# Patient Record
Sex: Male | Born: 1950 | Race: White | Hispanic: No | Smoking: Never smoker
Health system: Southern US, Community
[De-identification: ages and names within clinical notes are randomized; demographics above are authoritative.]

## PROBLEM LIST (undated history)

## (undated) DIAGNOSIS — F329 Major depressive disorder, single episode, unspecified: Secondary | ICD-10-CM

## (undated) DIAGNOSIS — F32A Depression, unspecified: Secondary | ICD-10-CM

## (undated) DIAGNOSIS — Z96652 Presence of left artificial knee joint: Secondary | ICD-10-CM

## (undated) DIAGNOSIS — K759 Inflammatory liver disease, unspecified: Secondary | ICD-10-CM

## (undated) DIAGNOSIS — M1711 Unilateral primary osteoarthritis, right knee: Secondary | ICD-10-CM

## (undated) DIAGNOSIS — F419 Anxiety disorder, unspecified: Secondary | ICD-10-CM

## (undated) HISTORY — PX: OTHER SURGICAL HISTORY: SHX169

---
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
## 1998-05-12 ENCOUNTER — Ambulatory Visit (HOSPITAL_COMMUNITY): Admission: RE | Admit: 1998-05-12 | Discharge: 1998-05-12 | Payer: Self-pay | Admitting: Sports Medicine

## 1998-07-07 ENCOUNTER — Ambulatory Visit (HOSPITAL_BASED_OUTPATIENT_CLINIC_OR_DEPARTMENT_OTHER): Admission: RE | Admit: 1998-07-07 | Discharge: 1998-07-07 | Payer: Self-pay | Admitting: Orthopedic Surgery

## 2000-05-01 ENCOUNTER — Emergency Department (HOSPITAL_COMMUNITY): Admission: EM | Admit: 2000-05-01 | Discharge: 2000-05-01 | Payer: Self-pay | Admitting: Emergency Medicine

## 2000-05-01 ENCOUNTER — Encounter: Payer: Self-pay | Admitting: Emergency Medicine

## 2000-05-02 ENCOUNTER — Ambulatory Visit (HOSPITAL_BASED_OUTPATIENT_CLINIC_OR_DEPARTMENT_OTHER): Admission: RE | Admit: 2000-05-02 | Discharge: 2000-05-02 | Payer: Self-pay | Admitting: Orthopedic Surgery

## 2002-07-18 ENCOUNTER — Encounter: Payer: Self-pay | Admitting: Orthopedic Surgery

## 2002-07-18 ENCOUNTER — Ambulatory Visit (HOSPITAL_COMMUNITY): Admission: RE | Admit: 2002-07-18 | Discharge: 2002-07-18 | Payer: Self-pay | Admitting: Orthopedic Surgery

## 2002-08-05 ENCOUNTER — Ambulatory Visit (HOSPITAL_BASED_OUTPATIENT_CLINIC_OR_DEPARTMENT_OTHER): Admission: RE | Admit: 2002-08-05 | Discharge: 2002-08-05 | Payer: Self-pay | Admitting: Orthopedic Surgery

## 2003-08-28 ENCOUNTER — Encounter: Payer: Self-pay | Admitting: Emergency Medicine

## 2003-08-28 ENCOUNTER — Emergency Department (HOSPITAL_COMMUNITY): Admission: EM | Admit: 2003-08-28 | Discharge: 2003-08-28 | Payer: Self-pay

## 2004-11-18 ENCOUNTER — Emergency Department (HOSPITAL_COMMUNITY): Admission: EM | Admit: 2004-11-18 | Discharge: 2004-11-18 | Payer: Self-pay | Admitting: Family Medicine

## 2006-04-21 ENCOUNTER — Encounter: Admission: RE | Admit: 2006-04-21 | Discharge: 2006-04-21 | Payer: Self-pay | Admitting: Internal Medicine

## 2008-12-18 ENCOUNTER — Ambulatory Visit: Payer: Self-pay | Admitting: Internal Medicine

## 2009-05-12 ENCOUNTER — Ambulatory Visit: Payer: Self-pay | Admitting: Internal Medicine

## 2009-11-27 ENCOUNTER — Ambulatory Visit: Payer: Self-pay | Admitting: Internal Medicine

## 2010-07-22 ENCOUNTER — Ambulatory Visit: Payer: Self-pay | Admitting: Internal Medicine

## 2010-12-05 ENCOUNTER — Emergency Department (HOSPITAL_COMMUNITY)
Admission: EM | Admit: 2010-12-05 | Discharge: 2010-12-05 | Payer: Self-pay | Source: Home / Self Care | Admitting: Family Medicine

## 2011-04-08 NOTE — Op Note (Signed)
[facility]  Patient:    Paul [name], Paul [name]                          MRN: [id number] Proc. Date: 05/02/00 Adm. Date:  [id number] Disc. Date: [id number] Attending:  Marlowe Shores CC:         Artist Pais Mina Marble, M.D. (two copies)                           Operative Report  PREOPERATIVE DIAGNOSIS:  Retained wooden fragments at the palmar aspect, right hand.  POSTOPERATIVE DIAGNOSIS:  Retained wooden fragments at the palmar aspect, right hand.  OPERATIONS: 1. Irrigation and debridement. 2. Removal of foreign body, deep right hand.  SURGEON:  Artist Pais. Mina Marble, M.D.  ASSISTANT:  Junius Roads. Ireton, P.A.C.  ANESTHESIA:  Bier block.  TOURNIQUET TIME:  28 minutes.  COMPLICATIONS:  None.  DRAINS:  None.  DESCRIPTION OF PROCEDURE:  The patient was taken to the operating room.  After induction of adequate Bier block analgesia, the right upper extremity was prepped and draped in the usual sterile fashion.  Once this was done, a Lorrene Reid type incision was made over the palmar aspect of the right hand in line with the metacarpal ray of the index finger where previous injury was identified.  The incision was raised appropriately with flaps elevated radially and medially.  Once this was done, dissection down to the area between the interosseous muscles of the index and long finger metacarpals revealed a wooden splinter approximately 1.5 cm in length x 3 mm in diameter. There were also multiple small fragments of bone that were removed as well. Thorough exploration of the wound including dissection around neurovascular bundles and the flexor tendon sheath revealed no significant infection in the sheath.  There was some cloudy fluid upon initial entrance of the wound.  This was cultured for both aerobic and anaerobic specimens.  The wound was thoroughly irrigated and then closed loosely with 4-0 nylon.  Attention was then paid to the dorsum of the hand  between the index and long finger metacarpals where the web space was incised for 2 cm.  Blunt dissection was carried down between the metacarpophalangeal joint and index of long finger. There was no evidence of pus, foreign body, etc.  This was thoroughly irrigated as well and was closed with 4-0 nylon.  Sterile dressing and Xeroform, 4 x 4s and a compressive dressing was applied.  The patient tolerated the procedure well and went to recovery room in stable fashion. DD:  05/02/00 TD:  05/05/00 Job: [id number]

## 2011-04-08 NOTE — Op Note (Signed)
NAME:  Paul [name], Paul [name]                             ACCOUNT NO.:  [id number]   MEDICAL RECORD NO.:  [id number]                   PATIENT TYPE:  AMB   LOCATION:  DSC                                  FACILITY:  [facility]   PHYSICIAN:  Robert A. Thurston Hole, M.D.              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   POSTOPERATIVE DIAGNOSIS:  Right knee medial and lateral meniscal tears with  chondromalacia.   PROCEDURE:  1. Right knee EOA followed by arthroscopic partial medial and lateral     meniscectomies.  2. Right knee tricompartmental chondroplasty.   SURGEON:  Elana Alm. Thurston Hole, M.D.   ASSISTANT:  Julien Girt, P.A.   ANESTHESIA:  General.   OPERATIVE TIME:  Thirty minutes.   COMPLICATIONS:  None.   INDICATIONS FOR PROCEDURE:  The patient is a 60 year old gentleman who  injured his right knee with a twisting injury 6 weeks ago with persistent  pain and swelling with x-ray, exam and MRI documenting a medial meniscus  tear who has failed conservative care and is now to undergo arthroscopy.   DESCRIPTION:  The patient was brought to the operating room on August 05, 2002 and placed on the operative table in supine position.  After being  placed under general anesthesia, his right knee was examined under  anesthesia.  Range of motion 0-120 degrees, 1 and 2+ crepitation, he was  stable to ligamentous exam to ligamentous exam with normal patella tracking.  He received Ancef 1 g IV preoperatively for prophylaxis.  His right knee was  examined under anesthesia.  He had full range of motion even with stable  ligamentous exam.  After being prepped with DuraPrep and draped under  sterile conditions, the arthroscopy was performed.  Through an inferolateral  portal the arthroscope with a pump attached was placed and through  an  inferomedial portal an arthroscopic probe was placed.  On initial inspection  of medial compartment the articular cartilage medial femoral condyle showed  50% grade 3 chondromalacia which was debrided, 25%  grade 3 changes medial  tibial plateau which was debrided.  Medial meniscus showed tearing of the  posterior and medial horn of which 40-50% was resected back to a stable rim.  Anterior condylar notch, anterior and posterior cruciate ligaments were  normal, lateral compartment inspected, articular cartilage lateral femoral  condyle of tibial plateau showed 25% grade 3 chondromalacia, the rest grade  1 and 2 changes and this was debrided.  Lateral meniscus showed a small tear  posterolateral point of 20% which was resected back to a stable rim.  Patellofemoral joint inspected, grade 3 chondromalacia of 30-40% of the  patella and the femoral groove which was debrided.  Patella tracked  normally.  Moderate synovitis in the medial and lateral gutters were  debrided otherwise, they were free of pathology.  After this was done it was  felt that all pathology had been satisfactory just.  The instruments were  removed.  The portal was closed with 3-0 nylon suture and injected with  0.25% Marcaine with epinephrine in 4 mg of morphine.  Sterile dressings  applied and the patient awakened and taken to recovery room in stable  condition.   FOLLOWUP CARE:  The patient will be followed as an outpatient on Vicodin and  Naprosyn.  See me in my office in 1 week procedure out in followup.                                               Robert A. Thurston Hole, M.D.    RAW/MEDQ  D:  08/05/2002  T:  08/05/2002  Job:  [id number]

## 2011-08-29 ENCOUNTER — Other Ambulatory Visit: Payer: Self-pay | Admitting: Internal Medicine

## 2011-08-29 NOTE — Telephone Encounter (Signed)
Needs office visit. Refill x 30 days.

## 2012-02-24 ENCOUNTER — Other Ambulatory Visit: Payer: Self-pay

## 2012-02-24 ENCOUNTER — Other Ambulatory Visit: Payer: Self-pay | Admitting: Internal Medicine

## 2012-02-24 MED ORDER — DULOXETINE HCL 60 MG PO CPEP: 60.0000 mg | ORAL_CAPSULE | Freq: Every day | ORAL | Status: DC

## 2012-02-24 NOTE — Telephone Encounter (Signed)
Patient has an appointment here next week.

## 2012-02-24 NOTE — Telephone Encounter (Signed)
Please see when last OV was.

## 2012-03-01 ENCOUNTER — Ambulatory Visit: Payer: Self-pay | Admitting: Internal Medicine

## 2012-03-08 ENCOUNTER — Encounter: Payer: Self-pay | Admitting: Internal Medicine

## 2012-03-08 ENCOUNTER — Ambulatory Visit (INDEPENDENT_AMBULATORY_CARE_PROVIDER_SITE_OTHER): Payer: Self-pay | Admitting: Internal Medicine

## 2012-03-08 VITALS — BP 138/74 | HR 86 | Temp 98.2°F | Resp 16 | Ht 71.0 in | Wt 172.0 lb

## 2012-03-08 DIAGNOSIS — F329 Major depressive disorder, single episode, unspecified: Secondary | ICD-10-CM

## 2012-03-19 DIAGNOSIS — F329 Major depressive disorder, single episode, unspecified: Secondary | ICD-10-CM | POA: Insufficient documentation

## 2012-03-19 NOTE — Patient Instructions (Signed)
Decreased Cymbalta to 30 mg daily. Return for physical examination in 6 months.

## 2012-03-19 NOTE — Progress Notes (Signed)
Subjective:    Patient ID: Paul [name], male    DOB: [birth year], 61 y.o.   MRN: [id number]  HPI 61 year old white male currently separated from his wife in today for followup on depression. He's been on Cymbalta 60 mg daily for some time now. Says he would like to decrease it at this point. Says he's not optimistic that he and wife will get back together. He has 2 children. His oldest child, a daughter, is in high school. He has a son that is several years younger than his daughter. 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 Likes his job. Has elderly father that depends on him a great deal. Patient was adopted.    Review of Systems     Objective:   Physical Exam chest clear to auscultation. Cardiac exam regular rate and rhythm.        Assessment & Plan:  Depression  Plan: Patient will decrease Cymbalta to 30 mg daily. New prescription written. Patient reminded he needs to physical exam in the near future. Return in 6 months.

## 2012-05-17 ENCOUNTER — Other Ambulatory Visit: Payer: Self-pay

## 2012-05-17 MED ORDER — DULOXETINE HCL 30 MG PO CPEP: 30.0000 mg | ORAL_CAPSULE | Freq: Every day | ORAL | Status: DC

## 2012-11-28 ENCOUNTER — Other Ambulatory Visit: Payer: Self-pay | Admitting: Internal Medicine

## 2012-11-28 NOTE — Telephone Encounter (Signed)
Needs office visit.

## 2012-12-24 ENCOUNTER — Ambulatory Visit (INDEPENDENT_AMBULATORY_CARE_PROVIDER_SITE_OTHER): Payer: Managed Care, Other (non HMO) | Admitting: Internal Medicine

## 2012-12-24 ENCOUNTER — Encounter: Payer: Self-pay | Admitting: Internal Medicine

## 2012-12-24 DIAGNOSIS — Z8619 Personal history of other infectious and parasitic diseases: Secondary | ICD-10-CM

## 2012-12-24 DIAGNOSIS — F329 Major depressive disorder, single episode, unspecified: Secondary | ICD-10-CM

## 2012-12-24 NOTE — Progress Notes (Signed)
Subjective:    Patient ID: Paul [name], male    DOB: 05/09/1951, 62 y.o.   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 He has a history of hepatitis C and received interferon. He recently had lab work done through employment including liver functions. Gastroenterologist in [place] told him as long as liver functions were normal he should not have to have a viral load followed on a regular basis. Have asked him to forward recent lab work to me. He's not had a physical exam here in some time. He has had a previous colonoscopy by Dr. Matthias Hughs but that was around 5 years ago and he should call and see when repeat study is due.  He was last seen here April 2013. At that time Cymbalta was decreased from 60 mg to 30 mg. He's done well on 30 mg daily. He still remains separated from his wife. Shares custody of 2 children who are teenagers. Probably best if he stays on this medication for an additional year although he did ask about coming off the medication. It is now generic and less expensive for him. He has an elderly father who is 44 years old. He is responsible for him. He has dementia.  Blood pressure is elevated today at 140 systolically. Says he's been watching it and it is usually about 130 systolically. He does exercise a great deal  And is in great physical condition.    Review of Systems     Objective:   Physical Exam chest is clear to auscultation. Cardiac exam regular rate and rhythm. Extremities without edema.        Assessment & Plan:  Elevated blood pressure  History of hepatitis C status post treatment with interferon  Depression-situational surrounding issues with separation  Plan: Refill Cymbalta generic 30 mg daily for an additional year. Patient should call Dr. Matthias Hughs regarding colonoscopy due date. Patient was asked to forward to me results of  recent lab work done through employment. Patient is continue to monitor blood pressure and advise if remains elevated.

## 2012-12-24 NOTE — Patient Instructions (Addendum)
Continue Cymbalta 30 mg daily. Return in one year. Call gastroenterologist to see when colonoscopy is due. Please follow her today results of recent lab work done through employment.

## 2013-04-09 ENCOUNTER — Ambulatory Visit: Payer: Self-pay | Admitting: Internal Medicine

## 2013-04-18 ENCOUNTER — Ambulatory Visit (INDEPENDENT_AMBULATORY_CARE_PROVIDER_SITE_OTHER): Payer: Managed Care, Other (non HMO) | Admitting: Internal Medicine

## 2013-04-18 ENCOUNTER — Encounter: Payer: Self-pay | Admitting: Internal Medicine

## 2013-04-18 VITALS — BP 138/76 | HR 80 | Temp 97.9°F | Ht 70.0 in | Wt 165.0 lb

## 2013-04-18 DIAGNOSIS — K140 Glossitis: Secondary | ICD-10-CM

## 2013-04-18 LAB — CBC WITH DIFFERENTIAL/PLATELET
Basophils Absolute: 0 10*3/uL (ref 0.0–0.1)
Basophils Relative: 0 % (ref 0–1)
Eosinophils Relative: 0 % (ref 0–5)
Lymphocytes Relative: 3 % — ABNORMAL LOW (ref 12–46)
MCHC: 34.9 g/dL (ref 30.0–36.0)
Neutro Abs: 13 10*3/uL — ABNORMAL HIGH (ref 1.7–7.7)
Platelets: 259 10*3/uL (ref 150–400)
RDW: 14.2 % (ref 11.5–15.5)
WBC: 14.3 10*3/uL — ABNORMAL HIGH (ref 4.0–10.5)

## 2013-04-18 NOTE — Patient Instructions (Addendum)
Use Magic mouthwash as directed 4 times daily. Call if not better in 2 weeks. Labs are pending.

## 2013-04-18 NOTE — Progress Notes (Signed)
Subjective:    Patient ID: Paul [name], male    DOB: 1951/02/07, 62 y.o.   MRN: [id number]  HPI Patient has been working out and exercising a great deal. Says he is in the best physical shape he's been in in over 4 years. For the past 3 weeks he's been experiencing turning sensation and decreased taste of his tongue. Says it feels like he has burned his tongue but he doesn't recall any injury to his tongue. Says both sides are irritated as well as the tip of his tongue. Says he's eating well. Does take a multivitamin daily. Not drinking alcohol to excess. He is adopted so we know little about his family history. Nonsmoker. No recent antibiotic therapy.    Review of Systems     Objective:   Physical Exam  tongue has a mild white coating midportion. Tip of tongue is slightly atrophic. No significant for [employer]. Sides of tongue slightly atrophic as well. No hairy coating on tongue.        Assessment & Plan:  Glossitis  Plan: Check for B12 and folate deficiencies. Also drew CBC with differential and C. met. Prescribed Magic mouthwash 16 ounces 2 teaspoons by mouth swish do not swallow 4 times a day with one refill. If not better in 2 weeks, consider referral to oral surgeon.

## 2013-04-19 LAB — COMPREHENSIVE METABOLIC PANEL
ALT: 22 U/L (ref 0–53)
AST: 22 U/L (ref 0–37)
Alkaline Phosphatase: 51 U/L (ref 39–117)
BUN: 24 mg/dL — ABNORMAL HIGH (ref 6–23)
Calcium: 9.7 mg/dL (ref 8.4–10.5)
Chloride: 99 mEq/L (ref 96–112)
Creat: 1.1 mg/dL (ref 0.50–1.35)
Potassium: 3.7 mEq/L (ref 3.5–5.3)

## 2013-05-03 ENCOUNTER — Telehealth: Payer: Self-pay | Admitting: Internal Medicine

## 2013-05-03 NOTE — Telephone Encounter (Signed)
Please call in Valtrex 500 mg twice daily for 5 days with one refill

## 2013-05-07 ENCOUNTER — Encounter: Payer: Self-pay | Admitting: Internal Medicine

## 2013-05-07 ENCOUNTER — Ambulatory Visit (INDEPENDENT_AMBULATORY_CARE_PROVIDER_SITE_OTHER): Payer: Managed Care, Other (non HMO) | Admitting: Internal Medicine

## 2013-05-07 VITALS — BP 136/78 | Temp 97.9°F | Wt 161.0 lb

## 2013-05-07 DIAGNOSIS — K140 Glossitis: Secondary | ICD-10-CM

## 2013-05-07 DIAGNOSIS — B009 Herpesviral infection, unspecified: Secondary | ICD-10-CM

## 2013-05-07 DIAGNOSIS — F439 Reaction to severe stress, unspecified: Secondary | ICD-10-CM

## 2013-05-07 DIAGNOSIS — J069 Acute upper respiratory infection, unspecified: Secondary | ICD-10-CM

## 2013-05-07 DIAGNOSIS — Z733 Stress, not elsewhere classified: Secondary | ICD-10-CM

## 2013-05-07 NOTE — Patient Instructions (Addendum)
Take Valtrex 500 mg twice daily for 5 days. Take Levaquin 500 milligrams daily for 7 days.

## 2013-05-07 NOTE — Progress Notes (Signed)
Subjective:    Patient ID: Paul [name], male    DOB: 12/01/1950, 62 y.o.   MRN: [id number]  HPI  He was here recently complaining of discomfort. We did some lab work. Thought perhaps he had a viral syndrome. 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 He called about this on Friday, June 13. I have given instructions for him to take Valtrex 500 mg twice daily for 5 days but apparently the prescription did not get called in. He is here today complaining of URI symptoms. He's very fatigued. He hasn't been working out. He's coughing up discolored sputum. No fever or shaking chills. Recently his wife surgeon was divorce papers which was upsetting. He had hoped that they would be able to get back together. They have been separated for 4 years. Does not have herpes simplex type II    Review of Systems     Objective:   Physical Exam tongue is coated. He says it feels irritated. TMs are clear. Pharynx is clear. Neck is supple without adenopathy. Chest clear to auscultation. No herpes simplex lesions type I identified today.        Assessment & Plan:  Glossitis  Herpes simplex type I  Upper respiratory infection acute  Plan: Levaquin 500 milligrams daily for 7 days. Valtrex 500 mg twice daily for 5 days with when necessary one year refills.

## 2013-06-10 ENCOUNTER — Encounter: Payer: Self-pay | Admitting: Internal Medicine

## 2013-06-10 ENCOUNTER — Ambulatory Visit (INDEPENDENT_AMBULATORY_CARE_PROVIDER_SITE_OTHER): Payer: Managed Care, Other (non HMO) | Admitting: Internal Medicine

## 2013-06-10 VITALS — BP 116/80 | HR 80 | Temp 98.0°F | Wt 164.0 lb

## 2013-06-10 DIAGNOSIS — J069 Acute upper respiratory infection, unspecified: Secondary | ICD-10-CM

## 2013-06-10 MED ORDER — PREDNISONE 10 MG PO KIT: PACK | ORAL | Status: DC

## 2013-06-10 MED ORDER — CLARITHROMYCIN 500 MG PO TABS: 500.0000 mg | ORAL_TABLET | Freq: Two times a day (BID) | ORAL | Status: DC

## 2013-06-10 MED ORDER — CEFTRIAXONE SODIUM 1 G IJ SOLR
1.0000 g | Freq: Once | INTRAMUSCULAR | Status: AC
  Administered 2013-06-10: 1 g via INTRAMUSCULAR

## 2013-06-10 NOTE — Patient Instructions (Addendum)
Take Sterapred DS 10 mg 12 day dosepak as directed. Take Biaxin 500 mg twice daily for 10 days. You have been given an injection of Rocephin IM today. Call if not better in 2 weeks. If not better in 2 weeks see allergist

## 2013-06-10 NOTE — Progress Notes (Signed)
Subjective:    Patient ID: Paul [name], male    DOB: [birth year], 62 y.o.   MRN: [id number]  HPI  Pt was here on June complaining of glossitis and sinusitis symptoms. Had significant discolored nasal drainage. He took 2 courses of Avelox and is still symptomatic. He is better but not 100%. Nasal drainage is now clear but he is fairly profuse. He just returned from trip with his children to [place]. He went snorkeling there. No fever or chills. No sore throat. Energy level is not back to normal.    Review of Systems     Objective:   Physical Exam Pharynx is clear. Left TM obscured by cerumen. Right TM is clear. Neck is supple without significant adenopathy. Chest is clear. Skin is warm and dry.        Assessment & Plan:  Persistent URI symptoms-could have allergic component at this point. He has had 2 rounds of Avelox. However has not been treated for Mycoplasma. Plan: Rocephin 1 g IM. Sterapred DS 10 mg 12 day dosepak- take as directed. Biaxin 500 mg twice daily for 10 days with no refill. If symptoms persist, he needs to see allergist.

## 2013-06-10 NOTE — Addendum Note (Signed)
Addended by: Judy Pimple on: 06/10/2013 04:27 PM   Modules accepted: Orders

## 2013-07-08 ENCOUNTER — Telehealth: Payer: Self-pay | Admitting: Internal Medicine

## 2013-07-08 NOTE — Telephone Encounter (Signed)
Needs to be allergy tested. Does he want to see Dr. Shelle Iron?

## 2013-07-09 NOTE — Telephone Encounter (Signed)
Left message for patient to call me.   Advised of Dr. Beryle Quant message below r/t being allergy tested.  Will await patient return call to see if he wants to be set up to see Dr. Shelle Iron.

## 2013-07-10 NOTE — Telephone Encounter (Signed)
Spoke with patient; patient advised that Dr. Shelle Iron is a good friend of his and he had actually spoken with him prior to returning my call.  Dr. Shelle Iron is going to see him and is going to take care of him.  Appt no longer needed with Dr. Lenord Fellers per patient.

## 2014-01-09 ENCOUNTER — Other Ambulatory Visit: Payer: Self-pay

## 2014-01-09 MED ORDER — DULOXETINE HCL 30 MG PO CPEP: 30.0000 mg | ORAL_CAPSULE | Freq: Every day | ORAL | Status: DC

## 2014-02-03 ENCOUNTER — Ambulatory Visit (INDEPENDENT_AMBULATORY_CARE_PROVIDER_SITE_OTHER): Payer: Managed Care, Other (non HMO) | Admitting: Internal Medicine

## 2014-02-03 ENCOUNTER — Encounter: Payer: Self-pay | Admitting: Internal Medicine

## 2014-02-03 VITALS — BP 120/78 | HR 76 | Temp 98.1°F | Wt 170.0 lb

## 2014-02-03 DIAGNOSIS — Z8659 Personal history of other mental and behavioral disorders: Secondary | ICD-10-CM

## 2014-02-03 DIAGNOSIS — N529 Male erectile dysfunction, unspecified: Secondary | ICD-10-CM

## 2014-02-03 MED ORDER — DULOXETINE HCL 30 MG PO CPEP: 30.0000 mg | ORAL_CAPSULE | Freq: Every day | ORAL | Status: DC

## 2014-02-03 MED ORDER — TADALAFIL 20 MG PO TABS: 10.0000 mg | ORAL_TABLET | ORAL | Status: DC | PRN

## 2014-02-03 NOTE — Progress Notes (Signed)
Subjective:    Patient ID: Paul [name], male    DOB: 1950/12/05, 63 y.o.   MRN: [id number]  HPI Patient been on Cymbalta for several years now. He has decreased from 60 mg to 30 mg daily. Says he feels well and would like to get off the medication. Also is having some issues with erectile dysfunction. Would like to try medication for that. He continues to work as a Engineer, civil (consulting) at [employer]. He's doing a lot of physical activity including climbing crawls fit. He says he is in the best physical shape he is ever been and feels great. He has not had a physical exam in some time. He's not had a colonoscopy in about 10 years. His adopted father is still living and is 18 years old. He continues to live alone but [place] checks on them every day. Recently patient began to look into his family history as he was adopted. He is thinking about arranging a meeting but is not sure he wants to do that. He has learned he has 2 brothers and a sister.    Review of Systems     Objective:   Physical Exam  Not examined. Spent 20 minutes speaking with patient about these issues      Assessment & Plan:  Depression-improved. He may taper Cymbalta by taking it every other day for 30 days and then twice a week for 2-4 weeks and then discontinue it altogether.  Erectile dysfunction-samples of Cialis given today 20 mg one hour before intercourse. Prescription called in.  Health maintenance-patient needs physical exam and fasting lab work. He will consider it. Also needs followup colonoscopy.

## 2014-02-03 NOTE — Patient Instructions (Signed)
Taper Cymbalta as directed every other day for 30 days and then twice a week for 2-4 weeks. Consider physical examination and followup colonoscopy. Takes Cialis as needed for erectile dysfunction.

## 2014-08-06 ENCOUNTER — Encounter (HOSPITAL_COMMUNITY): Payer: Self-pay | Admitting: Emergency Medicine

## 2014-08-06 ENCOUNTER — Emergency Department (HOSPITAL_COMMUNITY)
Admission: EM | Admit: 2014-08-06 | Discharge: 2014-08-06 | Disposition: A | Discharge status: Home / Self Care | Payer: Managed Care, Other (non HMO) | Source: Home / Self Care | Attending: Family Medicine | Admitting: Family Medicine

## 2014-08-06 DIAGNOSIS — IMO0002 Reserved for concepts with insufficient information to code with codable children: Secondary | ICD-10-CM

## 2014-08-06 DIAGNOSIS — T700XXA Otitic barotrauma, initial encounter: Secondary | ICD-10-CM

## 2014-08-06 MED ORDER — FLUTICASONE PROPIONATE 50 MCG/ACT NA SUSP: 2.0000 | Freq: Every day | NASAL | Status: DC

## 2014-08-06 MED ORDER — ANTIPYRINE-BENZOCAINE 5.4-1.4 % OT SOLN: 3.0000 [drp] | OTIC | Status: DC | PRN

## 2014-08-06 NOTE — ED Provider Notes (Signed)
CSN: [id number]     Arrival date & time 08/06/14  1031 History   First MD Initiated Contact with Patient 08/06/14 1048     Chief Complaint  Patient presents with  . Otalgia   (Consider location/radiation/quality/duration/timing/severity/associated sxs/prior Treatment) HPI Patient presents with right ear pressure. Also felt at side of head and neck. Symptoms started 10 days ago after swimming in a river. Advil improves symptoms. No hearing loss. Mild pain that is constant. Nothing worsens symptoms. No fevers, chills, nausea vomiting or balance issues. No cough, sneezing runny nose. No history of allergies.  History reviewed. No pertinent past medical history. History reviewed. No pertinent past surgical history. No family history on file. History  Substance Use Topics  . Smoking status: Never Smoker   . Smokeless tobacco: Not on file  . Alcohol Use: Yes    Review of Systems  Constitutional: Negative for fever.  HENT: Positive for ear pain. Negative for ear discharge, hearing loss, postnasal drip, rhinorrhea, sinus pressure, sneezing, sore throat and tinnitus.     Allergies  Review of patient's allergies indicates no known allergies.  Home Medications   Prior to Admission medications   Medication Sig Start Date End Date Taking? Authorizing Provider  antipyrine-benzocaine Lyla Son) otic solution Place 3-4 drops into the right ear every 2 (two) hours as needed for ear pain. 08/06/14   Jacquelin Hawking, MD  DULoxetine (CYMBALTA) 30 MG capsule Take 1 capsule (30 mg total) by mouth daily. 02/03/14   Margaree Mackintosh, MD  fish oil-omega-3 fatty acids 1000 MG capsule Take 2 g by mouth daily.    Historical Provider, MD  fluticasone (FLONASE) 50 MCG/ACT nasal spray Place 2 sprays into both nostrils daily. 08/06/14   Jacquelin Hawking, MD  Multiple Vitamin (MULTIVITAMIN) capsule Take 1 capsule by mouth daily.    Historical Provider, MD  tadalafil (CIALIS) 20 MG tablet Take 0.5-1 tablets (10-20 mg total)  by mouth every other day as needed for erectile dysfunction. 02/03/14   Margaree Mackintosh, MD   BP 118/77  Pulse 81  Temp(Src) 98.3 F (36.8 C) (Oral)  Resp 16  SpO2 97% Physical Exam  Constitutional: He appears well-developed and well-nourished.  HENT:  Right Ear: Tympanic membrane normal. No drainage. No foreign bodies. No mastoid tenderness. No middle ear effusion.  Left TM not visualized due to cerumen obstruction.  Neck: Normal range of motion and full passive range of motion without pain. Neck supple.    ED Course  Procedures (including critical care time) Labs Review Labs Reviewed - No data to display  Imaging Review No results found.   MDM   1. Barotitis media, initial encounter    No mastoid tenderness or bruising. Will prescribe Flonase and Auralgan. Patient's main concern was checking to make sure he had no infection or fluid. Do not suspect infection. Patient instructed to follow-up with PCP if symptoms do not improve. Patient understood and agreed with plan. Stable for discharge home.    Jacquelin Hawking, MD 08/06/14 [id number]

## 2014-08-06 NOTE — ED Notes (Signed)
C/o right ear pain onset 10 days Pain radiates to right side of neck and face [place] Denies f/v, chills, cold sx Taking advil w/no relief.  Alert, no signs of acute distress.

## 2014-08-06 NOTE — ED Provider Notes (Signed)
YAVIEL KLOSTER is a 63 y.o. male who presents to Urgent Care today for right ear pain. Patient is a ten-day history of mild right-sided ear pain. No fevers or chills nausea vomiting or diarrhea. No cough congestion or runny nose. No injury. No decreased hearing. No discharge.   History reviewed. No pertinent past medical history. History  Substance Use Topics  . Smoking status: Never Smoker   . Smokeless tobacco: Not on file  . Alcohol Use: Yes   ROS as above Medications: No current facility-administered medications for this encounter.   Current Outpatient Prescriptions  Medication Sig Dispense Refill  . DULoxetine (CYMBALTA) 30 MG capsule Take 1 capsule (30 mg total) by mouth daily.  30 capsule  5  . fish oil-omega-3 fatty acids 1000 MG capsule Take 2 g by mouth daily.      . Multiple Vitamin (MULTIVITAMIN) capsule Take 1 capsule by mouth daily.      . tadalafil (CIALIS) 20 MG tablet Take 0.5-1 tablets (10-20 mg total) by mouth every other day as needed for erectile dysfunction.  5 tablet  11    Exam:  BP 118/77  Pulse 81  Temp(Src) 98.3 F (36.8 C) (Oral)  Resp 16  SpO2 97% Gen: Well NAD HEENT: EOMI,  MMM right-sided membranes normal. Left is occluded by cerumen. Nontender mastoids bilaterally. Neck: Nontender throughout. Full neck range of motion Lungs: Normal work of breathing. CTABL Heart: RRR no MRG Abd: NABS, Soft. Nondistended, Nontender Exts: Brisk capillary refill, warm and well perfused.   No results found for this or any previous visit (from the past 24 hour(s)). No results found.  Assessment and Plan: 62 y.o. male with barotitis otitis media. Auralgan and Flonase nasal spray. Followup as needed  Discussed warning signs or symptoms. Please see discharge instructions. Patient expresses understanding.   This note was created using Conservation officer, historic buildings. Any transcription errors are unintended.    Rodolph Bong, MD 08/06/14 1120

## 2014-08-06 NOTE — Discharge Instructions (Signed)
Barotitis Media Barotitis media is inflammation of your middle ear. This occurs when the auditory tube (eustachian tube) leading from the back of your nose (nasopharynx) to your eardrum is blocked. This blockage may result from a cold, environmental allergies, or an upper respiratory infection. Unresolved barotitis media may lead to damage or hearing loss (barotrauma), which may become permanent. HOME CARE INSTRUCTIONS   Use medicines as recommended by your health care provider. Over-the-counter medicines will help unblock the canal and can help during times of air travel.  Do not put anything into your ears to clean or unplug them. Eardrops will not be helpful.  Do not swim, dive, or fly until your health care provider says it is all right to do so. If these activities are necessary, chewing gum with frequent, forceful swallowing may help. It is also helpful to hold your nose and gently blow to pop your ears for equalizing pressure changes. This forces air into the eustachian tube.  Only take over-the-counter or prescription medicines for pain, discomfort, or fever as directed by your health care provider.  A decongestant may be helpful in decongesting the middle ear and make pressure equalization easier. SEEK MEDICAL CARE IF:  You experience a serious form of dizziness in which you feel as if the room is spinning and you feel nauseated (vertigo).  Your symptoms only involve one ear. SEEK IMMEDIATE MEDICAL CARE IF:   You develop a severe headache, dizziness, or severe ear pain.  You have bloody or pus-like drainage from your ears.  You develop a fever.  Your problems do not improve or become worse. MAKE SURE YOU:   Understand these instructions.  Will watch your condition.  Will get help right away if you are not doing well or get worse. Document Released: 11/04/2000 Document Revised: 08/28/2013 Document Reviewed: 06/04/2013 ExitCare Patient Information 2015 [employer]. This  information is not intended to replace advice given to you by your health care provider. Make sure you discuss any questions you have with your health care provider.  

## 2014-08-07 NOTE — ED Provider Notes (Signed)
Medical screening examination/treatment/procedure(s) were performed by a resident physician or non-physician practitioner and as the supervising physician I was immediately available for consultation/collaboration.  Clementeen Graham, MD    Rodolph Bong, MD 08/07/14 [phone]

## 2014-08-15 ENCOUNTER — Ambulatory Visit: Payer: Self-pay | Admitting: Internal Medicine

## 2014-09-26 ENCOUNTER — Other Ambulatory Visit: Payer: Managed Care, Other (non HMO) | Admitting: Internal Medicine

## 2014-09-26 DIAGNOSIS — Z13220 Encounter for screening for lipoid disorders: Secondary | ICD-10-CM

## 2014-09-26 DIAGNOSIS — Z125 Encounter for screening for malignant neoplasm of prostate: Secondary | ICD-10-CM

## 2014-09-26 DIAGNOSIS — Z0000 Encounter for general adult medical examination without abnormal findings: Secondary | ICD-10-CM

## 2014-09-26 DIAGNOSIS — I10 Essential (primary) hypertension: Secondary | ICD-10-CM

## 2014-09-26 DIAGNOSIS — Z130 Encounter for screening for diseases of the blood and blood-forming organs and certain disorders involving the immune mechanism: Secondary | ICD-10-CM

## 2014-09-26 LAB — CBC WITH DIFFERENTIAL/PLATELET
Basophils Absolute: 0 10*3/uL (ref 0.0–0.1)
Basophils Relative: 0 % (ref 0–1)
EOS ABS: 0.1 10*3/uL (ref 0.0–0.7)
Eosinophils Relative: 2 % (ref 0–5)
HEMATOCRIT: 45.1 % (ref 39.0–52.0)
HEMOGLOBIN: 15.7 g/dL (ref 13.0–17.0)
LYMPHS PCT: 15 % (ref 12–46)
Lymphs Abs: 0.8 10*3/uL (ref 0.7–4.0)
MCH: 30.8 pg (ref 26.0–34.0)
MCHC: 34.8 g/dL (ref 30.0–36.0)
MCV: 88.6 fL (ref 78.0–100.0)
MONOS PCT: 6 % (ref 3–12)
Monocytes Absolute: 0.3 10*3/uL (ref 0.1–1.0)
Neutro Abs: 4.2 10*3/uL (ref 1.7–7.7)
Neutrophils Relative %: 77 % (ref 43–77)
Platelets: 255 10*3/uL (ref 150–400)
RBC: 5.09 MIL/uL (ref 4.22–5.81)
RDW: 13.5 % (ref 11.5–15.5)
WBC: 5.5 10*3/uL (ref 4.0–10.5)

## 2014-09-27 LAB — COMPREHENSIVE METABOLIC PANEL
ALT: 17 U/L (ref 0–53)
AST: 18 U/L (ref 0–37)
Albumin: 4.2 g/dL (ref 3.5–5.2)
Alkaline Phosphatase: 53 U/L (ref 39–117)
BILIRUBIN TOTAL: 0.7 mg/dL (ref 0.2–1.2)
BUN: 16 mg/dL (ref 6–23)
CO2: 21 mEq/L (ref 19–32)
CREATININE: 0.99 mg/dL (ref 0.50–1.35)
Calcium: 9.5 mg/dL (ref 8.4–10.5)
Chloride: 101 mEq/L (ref 96–112)
GLUCOSE: 111 mg/dL — AB (ref 70–99)
Potassium: 3.6 mEq/L (ref 3.5–5.3)
Sodium: 138 mEq/L (ref 135–145)
TOTAL PROTEIN: 6.7 g/dL (ref 6.0–8.3)

## 2014-09-27 LAB — LIPID PANEL
CHOLESTEROL: 206 mg/dL — AB (ref 0–200)
HDL: 62 mg/dL (ref >39)
LDL Cholesterol: 126 mg/dL — ABNORMAL HIGH (ref 0–99)
TRIGLYCERIDES: 88 mg/dL (ref <150)
Total CHOL/HDL Ratio: 3.3 Ratio
VLDL: 18 mg/dL (ref 0–40)

## 2014-09-27 LAB — PSA: PSA: 1.73 ng/mL (ref <=4.00)

## 2014-09-29 ENCOUNTER — Encounter: Payer: Self-pay | Admitting: Internal Medicine

## 2014-09-29 ENCOUNTER — Ambulatory Visit (INDEPENDENT_AMBULATORY_CARE_PROVIDER_SITE_OTHER): Payer: Managed Care, Other (non HMO) | Admitting: Internal Medicine

## 2014-09-29 VITALS — BP 124/70 | HR 67 | Temp 98.1°F | Ht 70.0 in | Wt 175.0 lb

## 2014-09-29 DIAGNOSIS — Z0000 Encounter for general adult medical examination without abnormal findings: Secondary | ICD-10-CM | POA: Diagnosis not present

## 2014-09-29 LAB — POCT URINALYSIS DIPSTICK
BILIRUBIN UA: NEGATIVE
Blood, UA: NEGATIVE
Glucose, UA: NEGATIVE
KETONES UA: NEGATIVE
LEUKOCYTES UA: NEGATIVE
Nitrite, UA: NEGATIVE
Protein, UA: NEGATIVE
Spec Grav, UA: 1.01
Urobilinogen, UA: NEGATIVE
pH, UA: 6.5

## 2014-10-02 ENCOUNTER — Other Ambulatory Visit: Payer: Self-pay | Admitting: Internal Medicine

## 2014-12-15 ENCOUNTER — Encounter: Payer: Self-pay | Admitting: Internal Medicine

## 2014-12-15 ENCOUNTER — Ambulatory Visit (INDEPENDENT_AMBULATORY_CARE_PROVIDER_SITE_OTHER): Payer: Managed Care, Other (non HMO) | Admitting: Internal Medicine

## 2014-12-15 VITALS — BP 110/66 | HR 59 | Temp 97.9°F | Wt 170.0 lb

## 2014-12-15 DIAGNOSIS — H109 Unspecified conjunctivitis: Secondary | ICD-10-CM

## 2014-12-15 MED ORDER — OFLOXACIN 0.3 % OP SOLN: OPHTHALMIC | Status: DC

## 2014-12-15 NOTE — Progress Notes (Signed)
Subjective:    Patient ID: Paul [name], male    DOB: Aug 24, 1951, 64 y.o.   MRN: [id number]  HPI  One-week history of eye irritation not responding the Visine. Does not wear contacts. No drainage from eyes. No fever. No recent URI symptoms. He is a Engineer, civil (consulting) for [employer] and cannot return to work to until he shows proof of office visit    Review of Systems     Objective:   Physical Exam   No drainage from eyes. Conjunctivae red bilaterally. PERRLA. Extraocular movements are full.     Assessment & Plan:  Bilateral conjunctivitis  Plan: Ocuflox ophthalmic drops 2 drops in each eye 4 times a day for 5 days with one refill. Wash towels and pillowcases daily. Wash hands frequently. May return to work in 24 hours if improved.

## 2014-12-15 NOTE — Patient Instructions (Signed)
Use Ocuflox opthalmic drops 4 times a day for 5 days.

## 2015-01-05 ENCOUNTER — Telehealth: Payer: Self-pay | Admitting: Internal Medicine

## 2015-01-05 MED ORDER — SULFACETAMIDE SODIUM 10 % OP SOLN: 2.0000 [drp] | Freq: Four times a day (QID) | OPHTHALMIC | Status: DC

## 2015-01-05 NOTE — Telephone Encounter (Signed)
Persistent conjunctivitis despite treatment with Ocuflox. Says it goes away when he is using the medication but comes back when he discontinues it. Switch to trimethoprim sulfa 2 drops each eye 4 times daily. Appointment with ophthalmologist later this week. He is off on Wednesday and Friday of this week.

## 2015-01-11 ENCOUNTER — Encounter: Payer: Self-pay | Admitting: Internal Medicine

## 2015-01-11 NOTE — Patient Instructions (Signed)
Watch diet. Return in one year or as needed.
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
## 2015-03-11 ENCOUNTER — Telehealth: Payer: Self-pay | Admitting: *Deleted

## 2015-03-11 ENCOUNTER — Encounter: Payer: Self-pay | Admitting: *Deleted

## 2015-03-11 MED ORDER — ALPRAZOLAM 0.5 MG PO TABS: 0.5000 mg | ORAL_TABLET | Freq: Three times a day (TID) | ORAL | Status: DC | PRN

## 2015-03-11 NOTE — Telephone Encounter (Signed)
Called in script for xanax to [facility]
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
## 2015-06-03 ENCOUNTER — Other Ambulatory Visit: Payer: Self-pay | Admitting: *Deleted

## 2015-06-03 MED ORDER — DULOXETINE HCL 30 MG PO CPEP: 30.0000 mg | ORAL_CAPSULE | Freq: Every day | ORAL | Status: DC

## 2015-06-03 NOTE — Telephone Encounter (Signed)
cymbalta refilled.

## 2015-12-30 ENCOUNTER — Encounter: Payer: Self-pay | Admitting: Internal Medicine

## 2015-12-30 ENCOUNTER — Ambulatory Visit (INDEPENDENT_AMBULATORY_CARE_PROVIDER_SITE_OTHER): Payer: BLUE CROSS/BLUE SHIELD | Admitting: Internal Medicine

## 2015-12-30 VITALS — BP 112/80 | HR 61 | Temp 97.9°F | Resp 20 | Ht 70.0 in | Wt 167.0 lb

## 2015-12-30 DIAGNOSIS — Z125 Encounter for screening for malignant neoplasm of prostate: Secondary | ICD-10-CM

## 2015-12-30 DIAGNOSIS — Z0000 Encounter for general adult medical examination without abnormal findings: Secondary | ICD-10-CM

## 2015-12-30 DIAGNOSIS — Z13220 Encounter for screening for lipoid disorders: Secondary | ICD-10-CM

## 2015-12-30 DIAGNOSIS — Z130 Encounter for screening for diseases of the blood and blood-forming organs and certain disorders involving the immune mechanism: Secondary | ICD-10-CM | POA: Diagnosis not present

## 2015-12-30 DIAGNOSIS — Z79899 Other long term (current) drug therapy: Secondary | ICD-10-CM | POA: Diagnosis not present

## 2015-12-30 LAB — POCT URINALYSIS DIPSTICK
Bilirubin, UA: NEGATIVE
Blood, UA: NEGATIVE
Glucose, UA: NEGATIVE
Ketones, UA: NEGATIVE
Leukocytes, UA: NEGATIVE
Nitrite, UA: NEGATIVE
Protein, UA: NEGATIVE
Spec Grav, UA: 1.01
Urobilinogen, UA: 0.2
pH, UA: 8

## 2015-12-30 MED ORDER — DULOXETINE HCL 30 MG PO CPEP: 30.0000 mg | ORAL_CAPSULE | Freq: Every day | ORAL | Status: DC
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
## 2015-12-30 NOTE — Patient Instructions (Signed)
Return in one year or as needed. Lab work pending. It was a pleasure to see you today. Cymbalta refilled for 1 year.

## 2015-12-31 LAB — LIPID PANEL
CHOL/HDL RATIO: 3.3 ratio (ref <=5.0)
Cholesterol: 216 mg/dL — ABNORMAL HIGH (ref 125–200)
HDL: 65 mg/dL (ref >=40)
LDL CALC: 137 mg/dL — AB (ref <130)
TRIGLYCERIDES: 69 mg/dL (ref <150)
VLDL: 14 mg/dL (ref <30)

## 2015-12-31 LAB — CBC WITH DIFFERENTIAL/PLATELET
BASOS PCT: 0 % (ref 0–1)
Basophils Absolute: 0 10*3/uL (ref 0.0–0.1)
EOS ABS: 0.1 10*3/uL (ref 0.0–0.7)
EOS PCT: 2 % (ref 0–5)
HCT: 44.9 % (ref 39.0–52.0)
Hemoglobin: 15.2 g/dL (ref 13.0–17.0)
Lymphocytes Relative: 16 % (ref 12–46)
Lymphs Abs: 0.8 10*3/uL (ref 0.7–4.0)
MCH: 30.5 pg (ref 26.0–34.0)
MCHC: 33.9 g/dL (ref 30.0–36.0)
MCV: 90.2 fL (ref 78.0–100.0)
MONOS PCT: 7 % (ref 3–12)
MPV: 9 fL (ref 8.6–12.4)
Monocytes Absolute: 0.3 10*3/uL (ref 0.1–1.0)
Neutro Abs: 3.7 10*3/uL (ref 1.7–7.7)
Neutrophils Relative %: 75 % (ref 43–77)
PLATELETS: 251 10*3/uL (ref 150–400)
RBC: 4.98 MIL/uL (ref 4.22–5.81)
RDW: 13.5 % (ref 11.5–15.5)
WBC: 4.9 10*3/uL (ref 4.0–10.5)

## 2015-12-31 LAB — COMPLETE METABOLIC PANEL WITH GFR
ALT: 14 U/L (ref 9–46)
AST: 17 U/L (ref 10–35)
Albumin: 4.1 g/dL (ref 3.6–5.1)
Alkaline Phosphatase: 48 U/L (ref 40–115)
BUN: 14 mg/dL (ref 7–25)
CHLORIDE: 102 mmol/L (ref 98–110)
CO2: 28 mmol/L (ref 20–31)
Calcium: 9.6 mg/dL (ref 8.6–10.3)
Creat: 0.76 mg/dL (ref 0.70–1.25)
GLUCOSE: 88 mg/dL (ref 65–99)
POTASSIUM: 3.9 mmol/L (ref 3.5–5.3)
Sodium: 140 mmol/L (ref 135–146)
Total Bilirubin: 0.7 mg/dL (ref 0.2–1.2)
Total Protein: 6.8 g/dL (ref 6.1–8.1)

## 2015-12-31 LAB — PSA: PSA: 2.24 ng/mL (ref <=4.00)

## 2016-03-31 ENCOUNTER — Telehealth: Payer: Self-pay | Admitting: Internal Medicine

## 2016-03-31 NOTE — Telephone Encounter (Signed)
Needs hepatitis A if never had. Second dose due in 6 months. All need to travel with Cip[ro and Phenergan. Suggest he bring them for OV and RX

## 2016-03-31 NOTE — Telephone Encounter (Signed)
Patient states he and his 2 kids are going to [place].  Wants to know if they need any vaccines.  Also wants to know if you feel they should each carry a antibiotic with them?  They will be leaving for [place] on 6/12.    Best # to reach him is [phone].  He's working today.  He'll be off on Friday and it will be easier to reach him on Friday.  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    P.S.  Daughter is in healthcare and is probably up to date on her vaccinations (she's [id number]) Son is [id number] and is not around healthcare at all.

## 2016-04-01 NOTE — Telephone Encounter (Signed)
Spoke w/ pt. Advised of need for Hep A- he states that he and his daughter are both good on those vaccines but that his son would need it. He will call back at first of week to schedule. He was advised of the 2 day turn around time on receiving the vaccine in office.

## 2016-04-21 ENCOUNTER — Ambulatory Visit: Payer: BLUE CROSS/BLUE SHIELD | Admitting: Internal Medicine

## 2016-04-25 ENCOUNTER — Ambulatory Visit (INDEPENDENT_AMBULATORY_CARE_PROVIDER_SITE_OTHER): Payer: BLUE CROSS/BLUE SHIELD | Admitting: Internal Medicine

## 2016-04-25 ENCOUNTER — Encounter: Payer: Self-pay | Admitting: Internal Medicine

## 2016-04-25 VITALS — BP 132/70 | HR 73 | Temp 98.1°F | Resp 18 | Wt 170.0 lb

## 2016-04-25 DIAGNOSIS — Z23 Encounter for immunization: Secondary | ICD-10-CM

## 2016-04-25 DIAGNOSIS — Z029 Encounter for administrative examinations, unspecified: Secondary | ICD-10-CM

## 2016-04-25 NOTE — Progress Notes (Signed)
Subjective:    Patient ID: Paul [name], male    DOB: 1950-12-07, 65 y.o.   MRN: [id number]  HPI Going to [place] for vacation next week. Needs hepatitis A vaccine. First dose given today. Also prescription for Cipro 500 mg twice daily for 7 days and Zofran 4 mg tablets 1 by mouth every 8 hours when necessary nausea.    Review of Systems     Objective:   Physical Exam  Not examined      Assessment & Plan:  Travel advice encounter  Plan: See above prescriptions written

## 2016-04-25 NOTE — Patient Instructions (Signed)
Prescriptions for Cipro and Zofran to be taken if illness develops while on trip  to [place]

## 2016-05-10 ENCOUNTER — Ambulatory Visit (INDEPENDENT_AMBULATORY_CARE_PROVIDER_SITE_OTHER): Payer: BLUE CROSS/BLUE SHIELD | Admitting: Internal Medicine

## 2016-05-10 ENCOUNTER — Encounter: Payer: Self-pay | Admitting: Internal Medicine

## 2016-05-10 VITALS — BP 126/82 | HR 97 | Temp 97.0°F | Resp 18 | Wt 168.0 lb

## 2016-05-10 DIAGNOSIS — H66002 Acute suppurative otitis media without spontaneous rupture of ear drum, left ear: Secondary | ICD-10-CM | POA: Diagnosis not present

## 2016-05-10 DIAGNOSIS — H6092 Unspecified otitis externa, left ear: Secondary | ICD-10-CM | POA: Diagnosis not present

## 2016-05-10 MED ORDER — NEOMYCIN-POLYMYXIN-HC 3.5-10000-1 OT SOLN: OTIC | Status: DC

## 2016-05-10 NOTE — Progress Notes (Signed)
Subjective:    Patient ID: Paul [name], male    DOB: 1951-04-20, 65 y.o.   MRN: [id number]  HPI Back from recent trip to [place] and resort where he did a lot of swimming. He had Cipro old hand but did not need to take it for gastroenteritis symptoms. He began to have left ear pain early on during the trip. Says it hurts to chew on left side. Has noticed left ear was stopped up for most of the trip and has not unstop since returning. Says he did have some postnasal drip. No fever or chills.    Review of Systems as above     Objective:   Physical Exam  Right TM is chronically scarred but not red. Left external ear canal is erythematous. There is impacted cerumen in the left external ear canal which was removed with curette. Despite removing the cerumen, he still could not hear.          Assessment & Plan:  Probable left otitis media  Left otitis externa  Plan: He will take course of Cipro 500 mg twice daily for 10 days. Cortisporin Otic suspension to left external ear canal-4 drops 4 times a day for 5-7 days. If he's not better next week, refer to ENT physician.

## 2016-05-10 NOTE — Patient Instructions (Signed)
Cipro 500 mg twice daily for 10 days. Cortisporin Otic suspension 4 drops in left external ear canal 4 times daily for 5-7 days.

## 2016-05-18 ENCOUNTER — Telehealth: Payer: Self-pay

## 2016-05-18 DIAGNOSIS — H9192 Unspecified hearing loss, left ear: Secondary | ICD-10-CM

## 2016-05-18 NOTE — Telephone Encounter (Signed)
Patient states that he was in a week ago with his ear and was told if he wasn't better in a week to call back. He states that he cannot tell much of a difference. He completed the cipro and is still using the drops. Please advise. CB# [phone]; I have spoken to Dr. Lazarus SalinesWolicki office and will send over notes.

## 2016-06-13 ENCOUNTER — Encounter: Payer: Self-pay | Admitting: Internal Medicine

## 2016-06-13 ENCOUNTER — Ambulatory Visit (INDEPENDENT_AMBULATORY_CARE_PROVIDER_SITE_OTHER): Payer: BLUE CROSS/BLUE SHIELD | Admitting: Internal Medicine

## 2016-06-13 VITALS — BP 132/84 | HR 65 | Temp 97.7°F | Ht 70.0 in | Wt 168.0 lb

## 2016-06-13 DIAGNOSIS — N410 Acute prostatitis: Secondary | ICD-10-CM

## 2016-06-13 DIAGNOSIS — B349 Viral infection, unspecified: Secondary | ICD-10-CM | POA: Diagnosis not present

## 2016-06-13 DIAGNOSIS — B009 Herpesviral infection, unspecified: Secondary | ICD-10-CM | POA: Diagnosis not present

## 2016-06-13 LAB — CBC WITH DIFFERENTIAL/PLATELET
BASOS PCT: 0 %
Basophils Absolute: 0 cells/uL (ref 0–200)
Eosinophils Absolute: 89 cells/uL (ref 15–500)
Eosinophils Relative: 1 %
HEMATOCRIT: 42.4 % (ref 38.5–50.0)
HEMOGLOBIN: 15.1 g/dL (ref 13.2–17.1)
LYMPHS ABS: 1157 {cells}/uL (ref 850–3900)
Lymphocytes Relative: 13 %
MCH: 31.1 pg (ref 27.0–33.0)
MCHC: 35.6 g/dL (ref 32.0–36.0)
MCV: 87.4 fL (ref 80.0–100.0)
MONO ABS: 712 {cells}/uL (ref 200–950)
MPV: 9.1 fL (ref 7.5–12.5)
Monocytes Relative: 8 %
NEUTROS ABS: 6942 {cells}/uL (ref 1500–7800)
Neutrophils Relative %: 78 %
Platelets: 292 10*3/uL (ref 140–400)
RBC: 4.85 MIL/uL (ref 4.20–5.80)
RDW: 13.7 % (ref 11.0–15.0)
WBC: 8.9 10*3/uL (ref 3.8–10.8)

## 2016-06-13 MED ORDER — CEFTRIAXONE SODIUM 1 G IJ SOLR
1.0000 g | Freq: Once | INTRAMUSCULAR | Status: AC
  Administered 2016-06-13: 1 g via INTRAMUSCULAR

## 2016-06-13 MED ORDER — VALACYCLOVIR HCL 500 MG PO TABS: 500.0000 mg | ORAL_TABLET | Freq: Two times a day (BID) | ORAL | 2 refills | Status: DC

## 2016-06-13 NOTE — Patient Instructions (Signed)
Rocephin 1 g IM given today. Valtrex 500 mg twice daily for 5 days for oral herpes. Rest and drink plenty of fluids. Advil for fever and myalgias. Return tomorrow for reevaluation.

## 2016-06-13 NOTE — Progress Notes (Signed)
Subjective:    Patient ID: Paul [name], male    DOB: [birth year], 65 y.o.   MRN: [id number]  HPI Just returned last night from trip to [facility] in [place]. Slept on ground. Did get mosquito bites. Has headache, chills but currently no fever. Has myalgias. No vomiting. Looks weak and a bit shaky. Says Advil relieves symptoms better than Tylenol. Having oral herpes outbreak also. Did not have Valtrex to take on trip.  Symptoms started on Thursday, July 20. He awakened with chills and felt hot. No nausea vomiting or diarrhea. Some urinary urgency. Tiburcio Pea been drinking a lot of fluids to stay hydrated while he was on his hiking and climbing trip. He climbed up to 10,000 feet and slept there. He was warned there could be Zika virus and hantavirus in the area. He feels that he is having significant lethargy and having a little bit of trouble thinking straight but he is lucid to my exam. Doesn't complain of a stiff neck.  He was alone on trip. He did see other campers. He is a very experienced climber.  Review of Systems see above-denies dysuria No sore throat or respiratory congestion. No cough.    Objective:   Physical Exam Skin warm and dry. He's having shaking chills in the office. Nodes none. TMs slightly scared by cerumen. Neck is supple without significant adenopathy.   Kernig and Brudzinski signs are negative  Chest is clear to auscultation without rales or wheezing.  No swollen joints  No identified insect bites  Prostate is boggy  Urine dipstick shows increased specific gravity 1.030 otherwise negative. Culture sent.  CBC with differential drawn and pending           Assessment & Plan regular:  Possible viral syndrome  Probable acute prostatitis  Herpes simplex type I  Plan: Rocephin 1 g IM. He is return tomorrow for reevaluation. Valtrex 500 mg twice daily for 5 days  Rest and drink plenty of fluids. Advil as needed for myalgias and fever.

## 2016-06-14 ENCOUNTER — Encounter: Payer: Self-pay | Admitting: Internal Medicine

## 2016-06-14 ENCOUNTER — Ambulatory Visit (INDEPENDENT_AMBULATORY_CARE_PROVIDER_SITE_OTHER): Payer: BLUE CROSS/BLUE SHIELD | Admitting: Internal Medicine

## 2016-06-14 VITALS — BP 122/78 | HR 62 | Temp 97.6°F | Ht 70.0 in | Wt 166.0 lb

## 2016-06-14 DIAGNOSIS — N410 Acute prostatitis: Secondary | ICD-10-CM

## 2016-06-14 MED ORDER — CIPROFLOXACIN HCL 500 MG PO TABS: 500.0000 mg | ORAL_TABLET | Freq: Two times a day (BID) | ORAL | 0 refills | Status: DC

## 2016-06-14 MED ORDER — CEFTRIAXONE SODIUM 1 G IJ SOLR
1.0000 g | Freq: Once | INTRAMUSCULAR | Status: AC
  Administered 2016-06-14: 1 g via INTRAMUSCULAR

## 2016-06-14 NOTE — Patient Instructions (Signed)
1 g IM Rocephin given once again today. Cipro 500 mg twice daily for 21 days.

## 2016-06-14 NOTE — Progress Notes (Signed)
Subjective:    Patient ID: Paul [name], male    DOB: Aug 24, 1951, 65 y.o.   MRN: [id number]  HPI Seen here yesterday with shaking chills malaise and fatigue. White blood cell count was normal. Urine dipstick was negative but was sent for culture. His prostate was boggy. He was assumed to have acute prostatitis and was given 1 g IM Rocephin. He also had a outbreak of oral Herpes simplex type I and was started on Valtrex. He had been on an extended trip hiking to [facility] in [place]. He looks much better today.    Review of Systems     Objective:   Physical Exam  Not examined. Yesterday he had boggy prostate and his chest was clear to auscultation. Spent 15 minutes speaking with him about these issues today. Urine culture is still pending. He has no shaking chills today and is afebrile.      Assessment & Plan:  Acute prostatitis  Plan: 1 g IM Rocephin today. Cipro 500 mg twice daily to complete a three-week course. Counseling regarding possible recurrence and symptoms to be aware of.

## 2016-06-14 NOTE — Addendum Note (Signed)
Addended by: Doree Barthel on: 06/14/2016 03:36 PM   Modules accepted: Orders

## 2016-06-15 LAB — URINE CULTURE: Organism ID, Bacteria: NO GROWTH

## 2016-07-12 ENCOUNTER — Encounter: Payer: Self-pay | Admitting: Internal Medicine

## 2016-07-12 ENCOUNTER — Ambulatory Visit (INDEPENDENT_AMBULATORY_CARE_PROVIDER_SITE_OTHER): Payer: BLUE CROSS/BLUE SHIELD | Admitting: Internal Medicine

## 2016-07-12 VITALS — BP 116/82 | HR 82 | Temp 97.8°F | Ht 70.0 in | Wt 166.0 lb

## 2016-07-12 DIAGNOSIS — F411 Generalized anxiety disorder: Secondary | ICD-10-CM | POA: Diagnosis not present

## 2016-07-12 DIAGNOSIS — Z8659 Personal history of other mental and behavioral disorders: Secondary | ICD-10-CM | POA: Diagnosis not present

## 2016-07-12 MED ORDER — ALPRAZOLAM 0.25 MG PO TABS: 0.2500 mg | ORAL_TABLET | Freq: Two times a day (BID) | ORAL | 0 refills | Status: DC | PRN

## 2016-07-12 MED ORDER — DULOXETINE HCL 60 MG PO CPEP: 60.0000 mg | ORAL_CAPSULE | Freq: Every day | ORAL | 3 refills | Status: DC

## 2016-07-12 NOTE — Progress Notes (Signed)
Subjective:    Patient ID: Paul [name], male    DOB: [birth year], 65 y.o.   MRN: [id number]  HPI  65 year old Male who has been maintained on Cymbalta for a number of years for depression in today to discuss anxiety which has had onset recently. He returned from a trip mountain climbing in [place]. He became ill on the trip. He took Cipro for a number of days as I thought he had prostatitis. Cipro caused bad dreams. Subsequently developed significant anxiety symptoms with palpitations and occasional skipped beats. Was not consuming excessive caffeine. Felt weak and lethargic and has not worked out in 3 weeks but has returned to working out just recently. Doesn't understand what causing anxiety. In April 2016 he lost a close friend and his father died shortly thereafter. He wonders if this is some sort of delayed grief reaction. He has a Veterinary surgeon but he's not quite ready to go speak with a counselor at this point in time. Thinks he would like to go up on Cymbalta from 30-60 mg daily. He used to be on 60 mg daily. Spent 30 minutes speaking with him about these issues. Youngest son went off to college just recently.    Review of Systems see above     Objective:   Physical Exam Not examined       Assessment & Plan:  Anxiety  History of depression  Plan: Xanax 0.25 mg to take up to twice day sparingly when necessary anxiety. Increase Cymbalta to 60 mg daily. Call if not better in 4 weeks or sooner if worse

## 2016-07-12 NOTE — Patient Instructions (Signed)
Increase Cymbalta to 60 mg daily. Xanax 0.25 mg sparingly twice daily if needed for anxiety. Call if not better in 4 weeks or sooner if worse.

## 2016-08-02 ENCOUNTER — Encounter: Payer: Self-pay | Admitting: Internal Medicine

## 2016-11-16 ENCOUNTER — Telehealth: Payer: Self-pay | Admitting: Internal Medicine

## 2016-11-16 NOTE — Telephone Encounter (Signed)
Needs CPE appt after Feb 8th. Please call to book before refilling until CPE appt.

## 2016-11-17 MED ORDER — DULOXETINE HCL 60 MG PO CPEP: 60.0000 mg | ORAL_CAPSULE | Freq: Every day | ORAL | 1 refills | Status: DC

## 2016-11-17 NOTE — Telephone Encounter (Signed)
Pt called and scheduled CPE for 01/03/16, CPE fasting labs 12/28/15. Patient uses [facility] on [address] And states that the rx is due to run out today. Please advise.

## 2016-11-17 NOTE — Addendum Note (Signed)
Addended by: Judd GaudierLEVENS, SHANNON M on: 11/17/2016 12:18 PM   Modules accepted: Orders

## 2016-12-27 ENCOUNTER — Other Ambulatory Visit: Payer: BLUE CROSS/BLUE SHIELD | Admitting: Internal Medicine

## 2016-12-27 DIAGNOSIS — Z125 Encounter for screening for malignant neoplasm of prostate: Secondary | ICD-10-CM

## 2016-12-27 DIAGNOSIS — Z13220 Encounter for screening for lipoid disorders: Secondary | ICD-10-CM

## 2016-12-27 DIAGNOSIS — Z0000 Encounter for general adult medical examination without abnormal findings: Secondary | ICD-10-CM

## 2016-12-27 LAB — CBC WITH DIFFERENTIAL/PLATELET
Basophils Absolute: 0 cells/uL (ref 0–200)
Basophils Relative: 0 %
EOS PCT: 2 %
Eosinophils Absolute: 100 cells/uL (ref 15–500)
HEMATOCRIT: 47.3 % (ref 38.5–50.0)
Hemoglobin: 15.9 g/dL (ref 13.2–17.1)
LYMPHS PCT: 16 %
Lymphs Abs: 800 cells/uL — ABNORMAL LOW (ref 850–3900)
MCH: 30.1 pg (ref 27.0–33.0)
MCHC: 33.6 g/dL (ref 32.0–36.0)
MCV: 89.6 fL (ref 80.0–100.0)
MONO ABS: 400 {cells}/uL (ref 200–950)
MONOS PCT: 8 %
MPV: 9 fL (ref 7.5–12.5)
NEUTROS PCT: 74 %
Neutro Abs: 3700 cells/uL (ref 1500–7800)
PLATELETS: 244 10*3/uL (ref 140–400)
RBC: 5.28 MIL/uL (ref 4.20–5.80)
RDW: 14 % (ref 11.0–15.0)
WBC: 5 10*3/uL (ref 3.8–10.8)

## 2016-12-27 LAB — COMPREHENSIVE METABOLIC PANEL
ALT: 15 U/L (ref 9–46)
AST: 17 U/L (ref 10–35)
Albumin: 4.3 g/dL (ref 3.6–5.1)
Alkaline Phosphatase: 57 U/L (ref 40–115)
BILIRUBIN TOTAL: 0.6 mg/dL (ref 0.2–1.2)
BUN: 15 mg/dL (ref 7–25)
CALCIUM: 9.7 mg/dL (ref 8.6–10.3)
CO2: 30 mmol/L (ref 20–31)
Chloride: 100 mmol/L (ref 98–110)
Creat: 0.93 mg/dL (ref 0.70–1.25)
GLUCOSE: 93 mg/dL (ref 65–99)
POTASSIUM: 5.1 mmol/L (ref 3.5–5.3)
Sodium: 139 mmol/L (ref 135–146)
Total Protein: 6.9 g/dL (ref 6.1–8.1)

## 2016-12-27 LAB — LIPID PANEL
CHOL/HDL RATIO: 3.2 ratio (ref <5.0)
Cholesterol: 214 mg/dL — ABNORMAL HIGH (ref <200)
HDL: 66 mg/dL (ref >40)
LDL CALC: 120 mg/dL — AB (ref <100)
Triglycerides: 139 mg/dL (ref <150)
VLDL: 28 mg/dL (ref <30)

## 2016-12-27 LAB — PSA: PSA: 1.6 ng/mL (ref <=4.0)

## 2017-01-02 ENCOUNTER — Ambulatory Visit (INDEPENDENT_AMBULATORY_CARE_PROVIDER_SITE_OTHER): Payer: BLUE CROSS/BLUE SHIELD | Admitting: Internal Medicine

## 2017-01-02 ENCOUNTER — Encounter: Payer: Self-pay | Admitting: Internal Medicine

## 2017-01-02 VITALS — BP 128/80 | HR 94 | Ht 70.0 in | Wt 179.0 lb

## 2017-01-02 DIAGNOSIS — Z8659 Personal history of other mental and behavioral disorders: Secondary | ICD-10-CM | POA: Diagnosis not present

## 2017-01-02 DIAGNOSIS — E7800 Pure hypercholesterolemia, unspecified: Secondary | ICD-10-CM

## 2017-01-02 DIAGNOSIS — Z8619 Personal history of other infectious and parasitic diseases: Secondary | ICD-10-CM | POA: Diagnosis not present

## 2017-01-02 DIAGNOSIS — Z0000 Encounter for general adult medical examination without abnormal findings: Secondary | ICD-10-CM

## 2017-01-02 LAB — POCT URINALYSIS DIPSTICK
BILIRUBIN UA: NEGATIVE
Blood, UA: NEGATIVE
Glucose, UA: NEGATIVE
Ketones, UA: NEGATIVE
Leukocytes, UA: NEGATIVE
Nitrite, UA: NEGATIVE
Protein, UA: NEGATIVE
Spec Grav, UA: 1.015
UROBILINOGEN UA: NEGATIVE
pH, UA: 6.5

## 2017-01-02 MED ORDER — DULOXETINE HCL 60 MG PO CPEP: 60.0000 mg | ORAL_CAPSULE | Freq: Every day | ORAL | 3 refills | Status: DC
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
## 2017-01-16 NOTE — Patient Instructions (Signed)
Continue Cymbalta as previously prescribed. Watch diet. Continue exercise regimen. Return in one year or as needed.

## 2018-01-11 ENCOUNTER — Other Ambulatory Visit: Payer: Self-pay

## 2018-01-11 NOTE — Telephone Encounter (Signed)
Patient called to request a refill on on his medication. CPE and labs scheduled in April.

## 2018-01-12 ENCOUNTER — Other Ambulatory Visit: Payer: Self-pay

## 2018-01-12 MED ORDER — DULOXETINE HCL 60 MG PO CPEP: 60.0000 mg | ORAL_CAPSULE | Freq: Every day | ORAL | 0 refills | Status: DC

## 2018-02-22 ENCOUNTER — Other Ambulatory Visit: Payer: BLUE CROSS/BLUE SHIELD | Admitting: Internal Medicine

## 2018-02-23 ENCOUNTER — Encounter: Payer: BLUE CROSS/BLUE SHIELD | Admitting: Internal Medicine

## 2018-03-16 ENCOUNTER — Other Ambulatory Visit: Payer: Self-pay | Admitting: Internal Medicine

## 2018-03-16 DIAGNOSIS — E7800 Pure hypercholesterolemia, unspecified: Secondary | ICD-10-CM

## 2018-03-16 DIAGNOSIS — Z125 Encounter for screening for malignant neoplasm of prostate: Secondary | ICD-10-CM

## 2018-03-16 DIAGNOSIS — Z0000 Encounter for general adult medical examination without abnormal findings: Secondary | ICD-10-CM

## 2018-03-16 DIAGNOSIS — Z8619 Personal history of other infectious and parasitic diseases: Secondary | ICD-10-CM

## 2018-03-16 DIAGNOSIS — Z8659 Personal history of other mental and behavioral disorders: Secondary | ICD-10-CM

## 2018-03-26 ENCOUNTER — Other Ambulatory Visit: Payer: BLUE CROSS/BLUE SHIELD | Admitting: Internal Medicine

## 2018-03-26 DIAGNOSIS — Z8619 Personal history of other infectious and parasitic diseases: Secondary | ICD-10-CM

## 2018-03-26 DIAGNOSIS — E7800 Pure hypercholesterolemia, unspecified: Secondary | ICD-10-CM

## 2018-03-26 DIAGNOSIS — Z125 Encounter for screening for malignant neoplasm of prostate: Secondary | ICD-10-CM

## 2018-03-26 DIAGNOSIS — Z0000 Encounter for general adult medical examination without abnormal findings: Secondary | ICD-10-CM

## 2018-03-26 DIAGNOSIS — Z8659 Personal history of other mental and behavioral disorders: Secondary | ICD-10-CM

## 2018-03-26 LAB — COMPLETE METABOLIC PANEL WITH GFR
AG RATIO: 1.6 (calc) (ref 1.0–2.5)
ALT: 14 U/L (ref 9–46)
AST: 17 U/L (ref 10–35)
Albumin: 4.4 g/dL (ref 3.6–5.1)
Alkaline phosphatase (APISO): 65 U/L (ref 40–115)
BILIRUBIN TOTAL: 0.5 mg/dL (ref 0.2–1.2)
BUN: 17 mg/dL (ref 7–25)
CHLORIDE: 100 mmol/L (ref 98–110)
CO2: 31 mmol/L (ref 20–32)
Calcium: 9.9 mg/dL (ref 8.6–10.3)
Creat: 0.98 mg/dL (ref 0.70–1.25)
GFR, EST AFRICAN AMERICAN: 93 mL/min/{1.73_m2} (ref >=60)
GFR, Est Non African American: 81 mL/min/{1.73_m2} (ref >=60)
Globulin: 2.7 g/dL (calc) (ref 1.9–3.7)
Glucose, Bld: 98 mg/dL (ref 65–99)
POTASSIUM: 5.2 mmol/L (ref 3.5–5.3)
Sodium: 139 mmol/L (ref 135–146)
TOTAL PROTEIN: 7.1 g/dL (ref 6.1–8.1)

## 2018-03-26 LAB — CBC WITH DIFFERENTIAL/PLATELET
BASOS ABS: 42 {cells}/uL (ref 0–200)
BASOS PCT: 0.8 %
EOS PCT: 2.5 %
Eosinophils Absolute: 130 cells/uL (ref 15–500)
HEMATOCRIT: 44.5 % (ref 38.5–50.0)
HEMOGLOBIN: 15.5 g/dL (ref 13.2–17.1)
LYMPHS ABS: 827 {cells}/uL — AB (ref 850–3900)
MCH: 30.6 pg (ref 27.0–33.0)
MCHC: 34.8 g/dL (ref 32.0–36.0)
MCV: 87.9 fL (ref 80.0–100.0)
MONOS PCT: 7.3 %
MPV: 9.2 fL (ref 7.5–12.5)
NEUTROS ABS: 3822 {cells}/uL (ref 1500–7800)
Neutrophils Relative %: 73.5 %
Platelets: 254 10*3/uL (ref 140–400)
RBC: 5.06 10*6/uL (ref 4.20–5.80)
RDW: 13.1 % (ref 11.0–15.0)
Total Lymphocyte: 15.9 %
WBC mixed population: 380 cells/uL (ref 200–950)
WBC: 5.2 10*3/uL (ref 3.8–10.8)

## 2018-03-26 LAB — LIPID PANEL
CHOL/HDL RATIO: 3.6 (calc) (ref <5.0)
Cholesterol: 232 mg/dL — ABNORMAL HIGH (ref <200)
HDL: 64 mg/dL (ref >40)
LDL CHOLESTEROL (CALC): 150 mg/dL — AB
NON-HDL CHOLESTEROL (CALC): 168 mg/dL — AB (ref <130)
Triglycerides: 74 mg/dL (ref <150)

## 2018-03-26 LAB — PSA: PSA: 1.4 ng/mL (ref <=4.0)

## 2018-03-27 ENCOUNTER — Ambulatory Visit (INDEPENDENT_AMBULATORY_CARE_PROVIDER_SITE_OTHER): Payer: BLUE CROSS/BLUE SHIELD | Admitting: Internal Medicine

## 2018-03-27 ENCOUNTER — Encounter: Payer: Self-pay | Admitting: Internal Medicine

## 2018-03-27 VITALS — BP 110/70 | HR 76 | Temp 98.2°F | Ht 70.0 in | Wt 177.0 lb

## 2018-03-27 DIAGNOSIS — E7800 Pure hypercholesterolemia, unspecified: Secondary | ICD-10-CM | POA: Diagnosis not present

## 2018-03-27 DIAGNOSIS — Z8659 Personal history of other mental and behavioral disorders: Secondary | ICD-10-CM | POA: Diagnosis not present

## 2018-03-27 DIAGNOSIS — Z8619 Personal history of other infectious and parasitic diseases: Secondary | ICD-10-CM | POA: Diagnosis not present

## 2018-03-27 DIAGNOSIS — Z0000 Encounter for general adult medical examination without abnormal findings: Secondary | ICD-10-CM | POA: Diagnosis not present

## 2018-03-27 LAB — POCT URINALYSIS DIPSTICK
Appearance: NORMAL
BILIRUBIN UA: NEGATIVE
Glucose, UA: NEGATIVE
KETONES UA: NEGATIVE
Leukocytes, UA: NEGATIVE
Nitrite, UA: NEGATIVE
Odor: NORMAL
Protein, UA: NEGATIVE
RBC UA: NEGATIVE
SPEC GRAV UA: 1.01 (ref 1.010–1.025)
Urobilinogen, UA: 0.2 E.U./dL
pH, UA: 7.5 (ref 5.0–8.0)

## 2018-03-27 MED ORDER — DULOXETINE HCL 60 MG PO CPEP: 60.0000 mg | ORAL_CAPSULE | Freq: Every day | ORAL | 3 refills | Status: DC

## 2018-03-27 MED ORDER — ALPRAZOLAM 0.25 MG PO TABS: 0.2500 mg | ORAL_TABLET | Freq: Two times a day (BID) | ORAL | 1 refills | Status: DC | PRN

## 2018-04-10 ENCOUNTER — Other Ambulatory Visit: Payer: Self-pay | Admitting: Internal Medicine
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
## 2018-04-18 NOTE — Patient Instructions (Signed)
It was a pleasure to see you today.  Work on diet and exercise due to elevated LDL level.  Continue same medications and return in 1 year or as needed.  Liver functions are normal.

## 2018-07-10 ENCOUNTER — Other Ambulatory Visit: Payer: Self-pay | Admitting: Internal Medicine

## 2018-10-04 ENCOUNTER — Telehealth: Payer: Self-pay | Admitting: Internal Medicine

## 2018-10-04 ENCOUNTER — Encounter: Payer: Self-pay | Admitting: Internal Medicine

## 2018-10-04 NOTE — Telephone Encounter (Signed)
Paul [name] requests medical clearance for TKA. Faxed approval. Had CPE Spring 2019.

## 2018-10-09 ENCOUNTER — Telehealth: Payer: Self-pay | Admitting: Internal Medicine

## 2018-10-09 NOTE — Telephone Encounter (Signed)
Paul [name], I have already cleared him based on last PE and faxed form back while you were out

## 2018-10-09 NOTE — Telephone Encounter (Signed)
Patient has to have a total knee replacement.  So, he has a surgical clearance needed.  Wants to know if he has to come in since he just had his CPE done in May.  He said it was his [employer]; however, I don't see that we did a [employer].  He also says that we did an EKG that day.  I also do not see an EKG on file from that day.  He is on Medicare, but the exam does not reflect a Medicare Exam done on that day.    I told him that most likely he will have to come in for a short exam.  I didn't find out about the EKG findings until after we hung up.     Phone:  [phone]  Thank you.

## 2018-10-09 NOTE — Telephone Encounter (Signed)
LM for patient on his cell phone voicemail.  He is to call us back if there are any further questions.

## 2018-11-12 HISTORY — PX: REPLACEMENT TOTAL KNEE: SUR1224

## 2018-11-30 ENCOUNTER — Ambulatory Visit (INDEPENDENT_AMBULATORY_CARE_PROVIDER_SITE_OTHER): Payer: Commercial Managed Care - PPO | Admitting: Internal Medicine

## 2018-11-30 ENCOUNTER — Encounter: Payer: Self-pay | Admitting: Internal Medicine

## 2018-11-30 VITALS — BP 150/90 | HR 93 | Temp 98.2°F | Ht 70.0 in | Wt 175.0 lb

## 2018-11-30 DIAGNOSIS — K120 Recurrent oral aphthae: Secondary | ICD-10-CM

## 2018-11-30 DIAGNOSIS — F411 Generalized anxiety disorder: Secondary | ICD-10-CM | POA: Diagnosis not present

## 2018-11-30 MED ORDER — ALPRAZOLAM 0.5 MG PO TABS: 0.5000 mg | ORAL_TABLET | Freq: Two times a day (BID) | ORAL | 1 refills | Status: DC | PRN

## 2018-12-02 NOTE — Patient Instructions (Signed)
Take Xanax as directed for anxiety.  Aphthous ulcers will likely resolve soon.

## 2018-12-02 NOTE — Progress Notes (Signed)
Subjective:    Patient ID: Paul [name], male    DOB: 13-Apr-1951, 68 y.o.   MRN: [id number]  HPI He recently underwent left knee arthroplasty by Dr. Thurston Hole and has done well postoperatively.  However he has developed mouth ulcers.  Is worried about being out of work and finances.  Is used to being very active and cannot really do as much as he used to due recent knee arthroplasty.  Is concerned about ulcers in his mouth.    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  Prescribe Xanax 0.5 mg twice daily as needed.

## 2019-01-14 ENCOUNTER — Other Ambulatory Visit: Payer: Self-pay | Admitting: Internal Medicine

## 2019-01-14 ENCOUNTER — Other Ambulatory Visit: Payer: Self-pay

## 2019-01-14 MED ORDER — DULOXETINE HCL 60 MG PO CPEP: ORAL_CAPSULE | ORAL | 0 refills | Status: DC

## 2019-01-15 ENCOUNTER — Encounter: Payer: Self-pay | Admitting: Internal Medicine

## 2019-01-15 ENCOUNTER — Ambulatory Visit (INDEPENDENT_AMBULATORY_CARE_PROVIDER_SITE_OTHER): Payer: Commercial Managed Care - PPO | Admitting: Internal Medicine

## 2019-01-15 VITALS — BP 140/80 | HR 88 | Temp 98.3°F | Ht 70.0 in | Wt 180.0 lb

## 2019-01-15 DIAGNOSIS — Z8659 Personal history of other mental and behavioral disorders: Secondary | ICD-10-CM

## 2019-01-15 NOTE — Progress Notes (Signed)
Cymbalta refilled . Recovering well from left knee arthroplasty. No other concerns. CPE due Summer 2020.

## 2019-01-15 NOTE — Patient Instructions (Signed)
Cymbalta refilled CPE due Summer 2020. Pt advised to make appt for CPE

## 2019-05-02 ENCOUNTER — Telehealth: Payer: Self-pay

## 2019-05-02 ENCOUNTER — Other Ambulatory Visit: Payer: Managed Care, Other (non HMO)

## 2019-05-02 DIAGNOSIS — Z20822 Contact with and (suspected) exposure to covid-19: Secondary | ICD-10-CM

## 2019-05-02 NOTE — Telephone Encounter (Signed)
Call received from the office of Tedra Senegal MD. [facility]. Pt with symptoms COVID-19 and needs testing. Per Araceli CMA.  Office [phone] Fax [phone]

## 2019-05-04 LAB — NOVEL CORONAVIRUS, NAA: SARS-CoV-2, NAA: NOT DETECTED

## 2019-07-08 ENCOUNTER — Other Ambulatory Visit: Payer: Self-pay | Admitting: Internal Medicine

## 2019-08-08 ENCOUNTER — Encounter: Payer: Self-pay | Admitting: Internal Medicine

## 2019-08-08 ENCOUNTER — Other Ambulatory Visit: Payer: Self-pay

## 2019-08-08 ENCOUNTER — Ambulatory Visit (INDEPENDENT_AMBULATORY_CARE_PROVIDER_SITE_OTHER): Payer: Commercial Managed Care - PPO | Admitting: Internal Medicine

## 2019-08-08 VITALS — BP 100/70 | HR 68 | Ht 70.0 in | Wt 178.0 lb

## 2019-08-08 DIAGNOSIS — K219 Gastro-esophageal reflux disease without esophagitis: Secondary | ICD-10-CM | POA: Diagnosis not present

## 2019-08-08 DIAGNOSIS — A048 Other specified bacterial intestinal infections: Secondary | ICD-10-CM | POA: Diagnosis not present

## 2019-08-08 MED ORDER — ALPRAZOLAM 0.5 MG PO TABS: 0.5000 mg | ORAL_TABLET | Freq: Two times a day (BID) | ORAL | 5 refills | Status: DC | PRN

## 2019-08-12 ENCOUNTER — Other Ambulatory Visit: Payer: Commercial Managed Care - PPO | Admitting: Internal Medicine

## 2019-08-13 ENCOUNTER — Other Ambulatory Visit: Payer: Commercial Managed Care - PPO | Admitting: Internal Medicine

## 2019-08-13 ENCOUNTER — Other Ambulatory Visit: Payer: Self-pay

## 2019-08-13 DIAGNOSIS — R140 Abdominal distension (gaseous): Secondary | ICD-10-CM

## 2019-08-13 DIAGNOSIS — K30 Functional dyspepsia: Secondary | ICD-10-CM

## 2019-08-13 DIAGNOSIS — R110 Nausea: Secondary | ICD-10-CM

## 2019-08-14 LAB — H. PYLORI BREATH TEST: H. pylori Breath Test: DETECTED — AB

## 2019-08-14 MED ORDER — CLARITHROMYCIN 500 MG PO TABS: ORAL_TABLET | ORAL | 0 refills | Status: DC

## 2019-08-14 MED ORDER — AMOXICILLIN 500 MG PO CAPS: ORAL_CAPSULE | ORAL | 0 refills | Status: DC

## 2019-08-14 MED ORDER — PANTOPRAZOLE SODIUM 40 MG PO TBEC: DELAYED_RELEASE_TABLET | ORAL | 3 refills | Status: DC

## 2019-08-14 NOTE — Patient Instructions (Signed)
Breath test is positive for H. pylori.  Take Biaxin 500 mg twice daily for 14 days.  Protonix 40 mg twice daily for 14 days and amoxicillin 500 mg capsules 2 capsules twice daily for 14 days.  After that have repeat H. pylori breath test 4 weeks later to ensure eradication of this bacteria.

## 2019-08-14 NOTE — Progress Notes (Signed)
Subjective:    Patient ID: Paul [name], male    DOB: 08-15-1951, 68 y.o.   MRN: [id number]  HPI Recent issues with GE reflux and bloating. These are new symptoms. Does not smoke. Seldom consumes alcohol. Has waterbrash at times.  Working with Covid patients at [employer] been dx with H. Pylori in the past. No recent travel history.    Review of Systems no melena or bright red blood per rectum     Objective:   Physical Exam  BP 100/70, pulse 68, Weight 178 pounds Abdomen: Nondistended without hepatosplenomegaly masses or tenderness.     Assessment & Plan:  Epigastric pain and reflux rule out H. pylori  Plan: He is to return next week for H. pylori test with further instructions to follow.  Addendum: H. pylori breath test done September 22 is positive.  Patient notified via voicemail.  He will be treated with Biaxin 500 mg twice daily for 14 days.  Protonix 40 mg twice daily for 14 days and amoxicillin 1 g twice daily for 14 days.  He needs repeat H. pylori breath test in 4 weeks.

## 2019-09-18 NOTE — Patient Instructions (Signed)
DUE TO COVID-19 ONLY ONE VISITOR IS ALLOWED TO COME WITH YOU AND STAY IN THE WAITING ROOM ONLY DURING PRE OP AND PROCEDURE DAY OF SURGERY. THE 1 VISITOR MAY VISIT WITH YOU AFTER SURGERY IN YOUR PRIVATE ROOM DURING VISITING HOURS ONLY!  YOU NEED TO HAVE A COVID 19 TEST ON_______ @_______ , THIS TEST MUST BE DONE BEFORE SURGERY, COME  [address], [place] , .  [facility]) ONCE YOUR COVID TEST IS COMPLETED, PLEASE BEGIN THE QUARANTINE INSTRUCTIONS AS OUTLINED IN YOUR HANDOUT.                DEMICO PLOCH  09/18/2019   Your procedure is scheduled on: 09-30-19   Report to [facility]  Entrance   Report to admitting at      0630 AM     Call this number if you have problems the morning of surgery [phone]    Remember: NO SOLID FOOD AFTER MIDNIGHT THE NIGHT PRIOR TO SURGERY. NOTHING BY MOUTH EXCEPT CLEAR LIQUIDS UNTIL    0600 am . PLEASE FINISH ENSURE DRINK PER SURGEON ORDER  WHICH NEEDS TO BE COMPLETED AT         0600 am  Then nothing by mouth .    CLEAR LIQUID DIET   Foods Allowed                                                                                             Foods Excluded  Coffee and tea, regular and decaf   NO CREAMER                               liquids that you cannot  Plain Jell-O any favor except red or purple                                           see through such as: Fruit ices (not with fruit pulp)                                                                   milk, soups, orange juice  Iced Popsicles                                                                    All solid food Carbonated beverages, regular and diet  Cranberry, grape and apple juices Sports drinks like Gatorade Lightly seasoned clear broth or consume(fat free) Sugar, honey syrup  _____________________________________________________________________  BRUSH YOUR TEETH MORNING OF SURGERY AND  RINSE YOUR MOUTH OUT, NO CHEWING GUM CANDY OR MINTS.     Take these medicines the morning of surgery with A SIP OF WATER: xanax if needed                                 You may not have any metal on your body including hair pins and              piercings  Do not wear jewelry,lotions, powders or perfumes, deodorant                      Men may shave face and neck.   Do not bring valuables to the hospital. [place].  Contacts, dentures or bridgework may not be worn into surgery.                Please read over the following fact sheets you were given: _____________________________________________________________________           [facility] - Preparing for Surgery Before surgery, you can play an important role.  Because skin is not sterile, your skin needs to be as free of germs as possible.  You can reduce the number of germs on your skin by washing with CHG (chlorahexidine gluconate) soap before surgery.  CHG is an antiseptic cleaner which kills germs and bonds with the skin to continue killing germs even after washing. Please DO NOT use if you have an allergy to CHG or antibacterial soaps.  If your skin becomes reddened/irritated stop using the CHG and inform your nurse when you arrive at Short Stay. Do not shave (including legs and underarms) for at least 48 hours prior to the first CHG shower.  You may shave your face/neck. Please follow these instructions carefully:  1.  Shower with CHG Soap the night before surgery and the  morning of Surgery.  2.  If you choose to wash your hair, wash your hair first as usual with your  normal  shampoo.  3.  After you shampoo, rinse your hair and body thoroughly to remove the  shampoo.                           4.  Use CHG as you would any other liquid soap.  You can apply chg directly  to the skin and wash                       Gently with a scrungie or clean washcloth.  5.  Apply the CHG Soap  to your body ONLY FROM THE NECK DOWN.   Do not use on face/ open                           Wound or open sores. Avoid contact with eyes, ears mouth and genitals (private parts).                       Wash face,  Genitals (private parts) with your normal soap.  6.  Wash thoroughly, paying special attention to the area where your surgery  will be performed.  7.  Thoroughly rinse your body with warm water from the neck down.  8.  DO NOT shower/wash with your normal soap after using and rinsing off  the CHG Soap.                9.  Pat yourself dry with a clean towel.            10.  Wear clean pajamas.            11.  Place clean sheets on your bed the night of your first shower and do not  sleep with pets. Day of Surgery : Do not apply any lotions/deodorants the morning of surgery.  Please wear clean clothes to the hospital/surgery center.  FAILURE TO FOLLOW THESE INSTRUCTIONS MAY RESULT IN THE CANCELLATION OF YOUR SURGERY PATIENT SIGNATURE_________________________________  NURSE SIGNATURE__________________________________  ________________________________________________________________________   Rogelia MireIncentive Spirometer  An incentive spirometer is a tool that can help keep your lungs clear and active. This tool measures how well you are filling your lungs with each breath. Taking long deep breaths may help reverse or decrease the chance of developing breathing (pulmonary) problems (especially infection) following:  A long period of time when you are unable to move or be active. BEFORE THE PROCEDURE   If the spirometer includes an indicator to show your best effort, your nurse or respiratory therapist will set it to a desired goal.  If possible, sit up straight or lean slightly forward. Try not to slouch.  Hold the incentive spirometer in an upright position. INSTRUCTIONS FOR USE  1. Sit on the edge of your bed if possible, or sit up as far as you can in bed or on a  chair. 2. Hold the incentive spirometer in an upright position. 3. Breathe out normally. 4. Place the mouthpiece in your mouth and seal your lips tightly around it. 5. Breathe in slowly and as deeply as possible, raising the piston or the ball toward the top of the column. 6. Hold your breath for 3-5 seconds or for as long as possible. Allow the piston or ball to fall to the bottom of the column. 7. Remove the mouthpiece from your mouth and breathe out normally. 8. Rest for a few seconds and repeat Steps 1 through 7 at least 10 times every 1-2 hours when you are awake. Take your time and take a few normal breaths between deep breaths. 9. The spirometer may include an indicator to show your best effort. Use the indicator as a goal to work toward during each repetition. 10. After each set of 10 deep breaths, practice coughing to be sure your lungs are clear. If you have an incision (the cut made at the time of surgery), support your incision when coughing by placing a pillow or rolled up towels firmly against it. Once you are able to get out of bed, walk around indoors and cough well. You may stop using the incentive spirometer when instructed by your caregiver.  RISKS AND COMPLICATIONS  Take your time so you do not get dizzy or light-headed.  If you are in pain, you may need to take or ask for pain medication before doing incentive spirometry. It is harder to take a deep breath if you are having pain. AFTER USE  Rest and breathe slowly and easily.  It can be helpful to keep track of a log of your  progress. Your caregiver can provide you with a simple table to help with this. If you are using the spirometer at home, follow these instructions: SEEK MEDICAL CARE IF:   You are having difficultly using the spirometer.  You have trouble using the spirometer as often as instructed.  Your pain medication is not giving enough relief while using the spirometer.  You develop fever of 100.5 F  (38.1 C) or higher. SEEK IMMEDIATE MEDICAL CARE IF:   You cough up bloody sputum that had not been present before.  You develop fever of 102 F (38.9 C) or greater.  You develop worsening pain at or near the incision site. MAKE SURE YOU:   Understand these instructions.  Will watch your condition.  Will get help right away if you are not doing well or get worse. Document Released: 03/20/2007 Document Revised: 01/30/2012 Document Reviewed: 05/21/2007 [facility] Patient Information 2014 [place], [place].   ________________________________________________________________________

## 2019-09-19 ENCOUNTER — Encounter: Payer: Self-pay | Admitting: Physician Assistant

## 2019-09-19 DIAGNOSIS — M1711 Unilateral primary osteoarthritis, right knee: Secondary | ICD-10-CM

## 2019-09-19 DIAGNOSIS — Z96652 Presence of left artificial knee joint: Secondary | ICD-10-CM

## 2019-09-19 HISTORY — DX: Unilateral primary osteoarthritis, right knee: M17.11

## 2019-09-19 HISTORY — DX: Presence of left artificial knee joint: Z96.652

## 2019-09-19 NOTE — H&P (Signed)
TOTAL KNEE ADMISSION H&P  Patient is being admitted for right total knee arthroplasty.  Subjective:  Chief Complaint:right knee pain.  HPI: Paul [name], 68 y.o. male, has a history of pain and functional disability in the right knee due to arthritis and has failed non-surgical conservative treatments for greater than 12 weeks to includeNSAID's and/or analgesics, corticosteriod injections, viscosupplementation injections, flexibility and strengthening excercises, supervised PT with diminished ADL's post treatment, weight reduction as appropriate and activity modification.  Onset of symptoms was gradual, starting 10 years ago with gradually worsening course since that time. The patient noted prior procedures on the knee to include  arthroscopy and menisectomy on the right knee(s).  Patient currently rates pain in the right knee(s) at 10 out of 10 with activity. Patient has night pain, worsening of pain with activity and weight bearing, pain that interferes with activities of daily living, crepitus and joint swelling.  Patient has evidence of subchondral sclerosis, periarticular osteophytes and joint space narrowing by imaging studies.There is no active infection.  Patient Active Problem List   Diagnosis Date Noted  . Primary localized osteoarthritis of right knee 09/19/2019  . S/P TKR (total knee replacement), left   11/12/2018 09/19/2019  . History of hepatitis C 12/24/2012  . Depression 03/19/2012   Past Medical History:  Diagnosis Date  . Primary localized osteoarthritis of right knee 09/19/2019  . S/P TKR (total knee replacement), left   11/12/2018 09/19/2019    Past Surgical History:  Procedure Laterality Date  . REPLACEMENT TOTAL KNEE Left 11/12/2018    No current facility-administered medications for this encounter.    Current Outpatient Medications  Medication Sig Dispense Refill Last Dose  . ALPRAZolam (XANAX) 0.5 MG tablet Take 1 tablet (0.5 mg total) by mouth 2 (two) times  daily as needed for anxiety. 60 tablet 5 Past Month at Unknown time  . DULoxetine (CYMBALTA) 60 MG capsule TAKE 1 CAPSULE BY MOUTH EVERY DAY (Patient taking differently: Take 60 mg by mouth every evening. ) 90 capsule 0 09/18/2019 at Unknown time  . amoxicillin (AMOXIL) 500 MG capsule 2 capsules twice daily for 14 days for H. pylori (Patient not taking: Reported on 09/17/2019) 56 capsule 0 Not Taking at Unknown time  . clarithromycin (BIAXIN) 500 MG tablet One po bid with food x 14 days for H. pylori (Patient not taking: Reported on 09/17/2019) 28 tablet 0 Not Taking at Unknown time  . pantoprazole (PROTONIX) 40 MG tablet 1 tablet twice daily for 14 days for H. pylori. (Patient not taking: Reported on 09/17/2019) 30 tablet 3 Not Taking at Unknown time   No Known Allergies  Social History   Tobacco Use  . Smoking status: Never Smoker  . Smokeless tobacco: Never Used  Substance Use Topics  . Alcohol use: Yes    Family History  Adopted: Yes  Family history unknown: Yes     Review of Systems  Constitutional: Negative.   HENT: Negative.   Eyes: Negative.   Respiratory: Negative.   Cardiovascular: Negative.   Gastrointestinal: Negative.   Genitourinary: Negative.   Musculoskeletal: Positive for back pain and joint pain.  Skin: Negative.   Neurological: Negative.   Endo/Heme/Allergies: Negative.   Psychiatric/Behavioral: Negative.     Objective:  Physical Exam  Constitutional: He is oriented to person, place, and time. He appears well-developed and well-nourished.  HENT:  Head: Normocephalic and atraumatic.  Mouth/Throat: Oropharynx is clear and moist.  Eyes: Pupils are equal, round, and reactive to light. Conjunctivae are normal.  Neck: Neck supple.  Cardiovascular: Normal rate and regular rhythm.  Respiratory: Effort normal.  GI: Bowel sounds are normal.  Musculoskeletal:     Comments: Examination of his right knee reveals pain medially and laterally.  1+ crepitation.  1+  synovitis.  Mild varus deformity.  Range of motion 0-120 degrees.  Knee is stable.  Examination of his left knee reveals well healed total knee incision without swelling or pain.  Range of motion 0-125 degrees.  Knee is stable with normal patella tracking.      Neurological: He is alert and oriented to person, place, and time.  Skin: Skin is warm and dry.  Psychiatric: He has a normal mood and affect. His behavior is normal.    Vital signs in last 24 hours: Temp:  [97.3 F (36.3 C)] 97.3 F (36.3 C) (10/29 0900) Pulse Rate:  [66] 66 (10/29 0900) BP: (152)/(79) 152/79 (10/29 0900) SpO2:  [99 %] 99 % (10/29 0900) Weight:  [79.8 kg] 79.8 kg (10/29 0900)  Labs:   Estimated body mass index is 25.25 kg/m as calculated from the following:   Height as of this encounter: 5\' 10"  (1.778 m).   Weight as of this encounter: 79.8 kg.   Imaging Review Plain radiographs demonstrate severe degenerative joint disease of the right knee(s). The overall alignment issignificant varus. The bone quality appears to be good for age and reported activity level.      Assessment/Plan:  End stage arthritis, right knee  Principal Problem:   Primary localized osteoarthritis of right knee Active Problems:   Depression   History of hepatitis C   S/P TKR (total knee replacement), left   11/12/2018  The patient history, physical examination, clinical judgment of the provider and imaging studies are consistent with end stage degenerative joint disease of the right knee(s) and total knee arthroplasty is deemed medically necessary. The treatment options including medical management, injection therapy arthroscopy and arthroplasty were discussed at length. The risks and benefits of total knee arthroplasty were presented and reviewed. The risks due to aseptic loosening, infection, stiffness, patella tracking problems, thromboembolic complications and other imponderables were discussed. The patient acknowledged the  explanation, agreed to proceed with the plan and consent was signed. Patient is being admitted for inpatient treatment for surgery, pain control, PT, OT, prophylactic antibiotics, VTE prophylaxis, progressive ambulation and ADL's and discharge planning. The patient is planning to be discharged home with home health services     Patient's anticipated LOS is less than 2 midnights, meeting these requirements: - Younger than 55 - Lives within 1 hour of care - Has a competent adult at home to recover with post-op recover - NO history of  - Chronic pain requiring opiods  - Diabetes  - Coronary Artery Disease  - Heart failure  - Heart attack  - Stroke  - DVT/VTE  - Cardiac arrhythmia  - Respiratory Failure/COPD  - Renal failure  - Anemia  - Advanced Liver disease

## 2019-09-24 ENCOUNTER — Encounter (HOSPITAL_COMMUNITY)
Admission: RE | Admit: 2019-09-24 | Discharge: 2019-09-24 | Disposition: A | Discharge status: Home / Self Care | Payer: Commercial Managed Care - PPO | Source: Ambulatory Visit | Attending: Orthopedic Surgery | Admitting: Orthopedic Surgery

## 2019-09-24 ENCOUNTER — Encounter (HOSPITAL_COMMUNITY): Payer: Self-pay

## 2019-09-24 ENCOUNTER — Other Ambulatory Visit: Payer: Self-pay

## 2019-09-24 DIAGNOSIS — M1711 Unilateral primary osteoarthritis, right knee: Secondary | ICD-10-CM | POA: Insufficient documentation

## 2019-09-24 DIAGNOSIS — Z01812 Encounter for preprocedural laboratory examination: Secondary | ICD-10-CM | POA: Diagnosis not present

## 2019-09-24 HISTORY — DX: Depression, unspecified: F32.A

## 2019-09-24 HISTORY — DX: Anxiety disorder, unspecified: F41.9

## 2019-09-24 HISTORY — DX: Inflammatory liver disease, unspecified: K75.9

## 2019-09-24 LAB — COMPREHENSIVE METABOLIC PANEL
ALT: 18 U/L (ref 0–44)
AST: 21 U/L (ref 15–41)
Albumin: 4.1 g/dL (ref 3.5–5.0)
Alkaline Phosphatase: 69 U/L (ref 38–126)
Anion gap: 7 (ref 5–15)
BUN: 25 mg/dL — ABNORMAL HIGH (ref 8–23)
CO2: 29 mmol/L (ref 22–32)
Calcium: 9.5 mg/dL (ref 8.9–10.3)
Chloride: 105 mmol/L (ref 98–111)
Creatinine, Ser: 0.95 mg/dL (ref 0.61–1.24)
GFR calc Af Amer: >60 mL/min (ref >60)
GFR calc non Af Amer: >60 mL/min (ref >60)
Glucose, Bld: 113 mg/dL — ABNORMAL HIGH (ref 70–99)
Potassium: 4.3 mmol/L (ref 3.5–5.1)
Sodium: 141 mmol/L (ref 135–145)
Total Bilirubin: 0.4 mg/dL (ref 0.3–1.2)
Total Protein: 7.2 g/dL (ref 6.5–8.1)

## 2019-09-24 LAB — PROTIME-INR
INR: 1 (ref 0.8–1.2)
Prothrombin Time: 13 seconds (ref 11.4–15.2)

## 2019-09-24 LAB — SURGICAL PCR SCREEN
MRSA, PCR: NEGATIVE
Staphylococcus aureus: NEGATIVE

## 2019-09-24 LAB — CBC WITH DIFFERENTIAL/PLATELET
Abs Immature Granulocytes: 0.01 10*3/uL (ref 0.00–0.07)
Basophils Absolute: 0 10*3/uL (ref 0.0–0.1)
Basophils Relative: 1 %
Eosinophils Absolute: 0.1 10*3/uL (ref 0.0–0.5)
Eosinophils Relative: 1 %
HCT: 46.2 % (ref 39.0–52.0)
Hemoglobin: 15.6 g/dL (ref 13.0–17.0)
Immature Granulocytes: 0 %
Lymphocytes Relative: 12 %
Lymphs Abs: 0.7 10*3/uL (ref 0.7–4.0)
MCH: 30.7 pg (ref 26.0–34.0)
MCHC: 33.8 g/dL (ref 30.0–36.0)
MCV: 90.9 fL (ref 80.0–100.0)
Monocytes Absolute: 0.4 10*3/uL (ref 0.1–1.0)
Monocytes Relative: 8 %
Neutro Abs: 4.2 10*3/uL (ref 1.7–7.7)
Neutrophils Relative %: 78 %
Platelets: 266 10*3/uL (ref 150–400)
RBC: 5.08 MIL/uL (ref 4.22–5.81)
RDW: 12.9 % (ref 11.5–15.5)
WBC: 5.4 10*3/uL (ref 4.0–10.5)
nRBC: 0 % (ref 0.0–0.2)

## 2019-09-24 LAB — APTT: aPTT: 32 seconds (ref 24–36)

## 2019-09-25 LAB — URINE CULTURE: Culture: NO GROWTH

## 2019-09-26 ENCOUNTER — Other Ambulatory Visit (HOSPITAL_COMMUNITY)
Admission: RE | Admit: 2019-09-26 | Discharge: 2019-09-26 | Disposition: A | Discharge status: Home / Self Care | Payer: Commercial Managed Care - PPO | Source: Ambulatory Visit | Attending: Orthopedic Surgery | Admitting: Orthopedic Surgery

## 2019-09-26 DIAGNOSIS — Z20828 Contact with and (suspected) exposure to other viral communicable diseases: Secondary | ICD-10-CM | POA: Insufficient documentation

## 2019-09-26 DIAGNOSIS — Z01812 Encounter for preprocedural laboratory examination: Secondary | ICD-10-CM | POA: Insufficient documentation

## 2019-09-27 LAB — NOVEL CORONAVIRUS, NAA (HOSP ORDER, SEND-OUT TO REF LAB; TAT 18-24 HRS): SARS-CoV-2, NAA: NOT DETECTED

## 2019-09-29 ENCOUNTER — Encounter (HOSPITAL_COMMUNITY): Payer: Self-pay | Admitting: Anesthesiology

## 2019-09-29 MED ORDER — BUPIVACAINE LIPOSOME 1.3 % IJ SUSP
20.0000 mL | Freq: Once | INTRAMUSCULAR | Status: DC
  Filled 2019-09-29: qty 20

## 2019-09-29 NOTE — Anesthesia Preprocedure Evaluation (Addendum)
Anesthesia Evaluation  Patient identified by MRN, date of birth, ID band Patient awake    Reviewed: Allergy & Precautions, NPO status , Patient's Chart, lab work & pertinent test results  Airway Mallampati: I       Dental no notable dental hx. (+) Teeth Intact   Pulmonary neg pulmonary ROS,    Pulmonary exam normal breath sounds clear to auscultation       Cardiovascular negative cardio ROS Normal cardiovascular exam Rhythm:Regular Rate:Normal     Neuro/Psych PSYCHIATRIC DISORDERS Anxiety Depression negative neurological ROS     GI/Hepatic GERD  Medicated,  Endo/Other  negative endocrine ROS  Renal/GU negative Renal ROS  negative genitourinary   Musculoskeletal  (+) Arthritis , Osteoarthritis,    Abdominal Normal abdominal exam  (+)   Peds  Hematology negative hematology ROS (+)   Anesthesia Other Findings   Reproductive/Obstetrics                            Anesthesia Physical Anesthesia Plan  ASA: II  Anesthesia Plan: Spinal   Post-op Pain Management:  Regional for Post-op pain   Induction:   PONV Risk Score and Plan: 1 and Ondansetron, Dexamethasone and Midazolam  Airway Management Planned: Nasal Cannula, Natural Airway and Simple Face Mask  Additional Equipment: None  Intra-op Plan:   Post-operative Plan:   Informed Consent: I have reviewed the patients History and Physical, chart, labs and discussed the procedure including the risks, benefits and alternatives for the proposed anesthesia with the patient or authorized representative who has indicated his/her understanding and acceptance.       Plan Discussed with: CRNA  Anesthesia Plan Comments:        Anesthesia Quick Evaluation

## 2019-09-30 ENCOUNTER — Inpatient Hospital Stay (HOSPITAL_COMMUNITY): Payer: Commercial Managed Care - PPO | Admitting: Certified Registered"

## 2019-09-30 ENCOUNTER — Inpatient Hospital Stay (HOSPITAL_COMMUNITY)
Admission: RE | Admit: 2019-09-30 | Discharge: 2019-10-01 | DRG: 470 | Disposition: A | Discharge status: Home / Self Care | Payer: Commercial Managed Care - PPO | Attending: Orthopedic Surgery | Admitting: Orthopedic Surgery

## 2019-09-30 ENCOUNTER — Other Ambulatory Visit: Payer: Self-pay

## 2019-09-30 ENCOUNTER — Encounter (HOSPITAL_COMMUNITY)
Admission: RE | Disposition: A | Discharge status: Home / Self Care | Payer: Self-pay | Source: Home / Self Care | Attending: Orthopedic Surgery

## 2019-09-30 ENCOUNTER — Encounter (HOSPITAL_COMMUNITY): Payer: Self-pay | Admitting: *Deleted

## 2019-09-30 DIAGNOSIS — F329 Major depressive disorder, single episode, unspecified: Secondary | ICD-10-CM | POA: Diagnosis present

## 2019-09-30 DIAGNOSIS — K219 Gastro-esophageal reflux disease without esophagitis: Secondary | ICD-10-CM | POA: Diagnosis present

## 2019-09-30 DIAGNOSIS — Z96652 Presence of left artificial knee joint: Secondary | ICD-10-CM

## 2019-09-30 DIAGNOSIS — Z79899 Other long term (current) drug therapy: Secondary | ICD-10-CM

## 2019-09-30 DIAGNOSIS — M1711 Unilateral primary osteoarthritis, right knee: Secondary | ICD-10-CM | POA: Diagnosis present

## 2019-09-30 DIAGNOSIS — F418 Other specified anxiety disorders: Secondary | ICD-10-CM | POA: Diagnosis present

## 2019-09-30 DIAGNOSIS — M25761 Osteophyte, right knee: Secondary | ICD-10-CM | POA: Diagnosis present

## 2019-09-30 DIAGNOSIS — F419 Anxiety disorder, unspecified: Secondary | ICD-10-CM | POA: Diagnosis present

## 2019-09-30 DIAGNOSIS — Z8619 Personal history of other infectious and parasitic diseases: Secondary | ICD-10-CM | POA: Diagnosis not present

## 2019-09-30 HISTORY — PX: TOTAL KNEE ARTHROPLASTY: SHX125

## 2019-09-30 SURGERY — ARTHROPLASTY, KNEE, TOTAL
Anesthesia: Spinal | Site: Knee | Laterality: Right

## 2019-09-30 MED ORDER — FENTANYL CITRATE (PF) 100 MCG/2ML IJ SOLN
INTRAMUSCULAR | Status: AC
  Filled 2019-09-30: qty 2

## 2019-09-30 MED ORDER — GLYCOPYRROLATE 0.2 MG/ML IJ SOLN
INTRAMUSCULAR | Status: DC | PRN
  Administered 2019-09-30 (×2): 0.1 mg via INTRAVENOUS

## 2019-09-30 MED ORDER — KETOROLAC TROMETHAMINE 30 MG/ML IJ SOLN: 30.0000 mg | Freq: Once | INTRAMUSCULAR | Status: DC | PRN

## 2019-09-30 MED ORDER — FENTANYL CITRATE (PF) 100 MCG/2ML IJ SOLN
INTRAMUSCULAR | Status: AC
  Administered 2019-09-30: 50 ug via INTRAVENOUS
  Filled 2019-09-30: qty 2

## 2019-09-30 MED ORDER — ONDANSETRON HCL 4 MG/2ML IJ SOLN
INTRAMUSCULAR | Status: AC
  Filled 2019-09-30: qty 2

## 2019-09-30 MED ORDER — CHLORHEXIDINE GLUCONATE 4 % EX LIQD: 60.0000 mL | Freq: Once | CUTANEOUS | Status: DC

## 2019-09-30 MED ORDER — DOCUSATE SODIUM 100 MG PO CAPS
100.0000 mg | ORAL_CAPSULE | Freq: Two times a day (BID) | ORAL | Status: DC
  Administered 2019-10-01: 08:00:00 100 mg via ORAL
  Filled 2019-09-30 (×2): qty 1

## 2019-09-30 MED ORDER — PROPOFOL 10 MG/ML IV BOLUS
INTRAVENOUS | Status: AC
  Filled 2019-09-30: qty 20

## 2019-09-30 MED ORDER — SODIUM CHLORIDE (PF) 0.9 % IJ SOLN
INTRAMUSCULAR | Status: AC
  Filled 2019-09-30: qty 50

## 2019-09-30 MED ORDER — BUPIVACAINE-EPINEPHRINE 0.25% -1:200000 IJ SOLN
INTRAMUSCULAR | Status: DC | PRN
  Administered 2019-09-30: 30 mL

## 2019-09-30 MED ORDER — CEFAZOLIN SODIUM-DEXTROSE 2-4 GM/100ML-% IV SOLN
2.0000 g | Freq: Four times a day (QID) | INTRAVENOUS | Status: AC
  Administered 2019-09-30 (×2): 2 g via INTRAVENOUS
  Filled 2019-09-30 (×2): qty 100

## 2019-09-30 MED ORDER — LACTATED RINGERS IV SOLN
INTRAVENOUS | Status: DC
  Administered 2019-09-30: 07:00:00 via INTRAVENOUS

## 2019-09-30 MED ORDER — POVIDONE-IODINE 7.5 % EX SOLN: Freq: Once | CUTANEOUS | Status: DC

## 2019-09-30 MED ORDER — DEXAMETHASONE SODIUM PHOSPHATE 10 MG/ML IJ SOLN
8.0000 mg | Freq: Once | INTRAMUSCULAR | Status: AC
  Administered 2019-09-30: 8 mg via INTRAVENOUS

## 2019-09-30 MED ORDER — ONDANSETRON HCL 4 MG/2ML IJ SOLN
INTRAMUSCULAR | Status: DC | PRN
  Administered 2019-09-30: 4 mg via INTRAVENOUS

## 2019-09-30 MED ORDER — ACETAMINOPHEN 500 MG PO TABS
1000.0000 mg | ORAL_TABLET | Freq: Four times a day (QID) | ORAL | Status: AC
  Administered 2019-09-30 – 2019-10-01 (×4): 1000 mg via ORAL
  Filled 2019-09-30 (×4): qty 2

## 2019-09-30 MED ORDER — PHENYLEPHRINE HCL-NACL 10-0.9 MG/250ML-% IV SOLN
INTRAVENOUS | Status: DC | PRN
  Administered 2019-09-30: 20 ug/min via INTRAVENOUS

## 2019-09-30 MED ORDER — LIDOCAINE 2% (20 MG/ML) 5 ML SYRINGE
INTRAMUSCULAR | Status: DC | PRN
  Administered 2019-09-30: 40 mg via INTRAVENOUS

## 2019-09-30 MED ORDER — ROPIVACAINE HCL 5 MG/ML IJ SOLN
INTRAMUSCULAR | Status: DC | PRN
  Administered 2019-09-30 (×2): 5 mL via PERINEURAL

## 2019-09-30 MED ORDER — MEPERIDINE HCL 50 MG/ML IJ SOLN: 6.2500 mg | INTRAMUSCULAR | Status: DC | PRN

## 2019-09-30 MED ORDER — OXYCODONE HCL 5 MG PO TABS
5.0000 mg | ORAL_TABLET | ORAL | Status: DC | PRN
  Administered 2019-09-30 – 2019-10-01 (×5): 10 mg via ORAL
  Filled 2019-09-30 (×5): qty 2

## 2019-09-30 MED ORDER — ONDANSETRON HCL 4 MG PO TABS: 4.0000 mg | ORAL_TABLET | Freq: Four times a day (QID) | ORAL | Status: DC | PRN

## 2019-09-30 MED ORDER — BUPIVACAINE LIPOSOME 1.3 % IJ SUSP
INTRAMUSCULAR | Status: DC | PRN
  Administered 2019-09-30: 20 mL

## 2019-09-30 MED ORDER — PROPOFOL 500 MG/50ML IV EMUL
INTRAVENOUS | Status: AC
  Filled 2019-09-30: qty 50

## 2019-09-30 MED ORDER — MENTHOL 3 MG MT LOZG: 1.0000 | LOZENGE | OROMUCOSAL | Status: DC | PRN

## 2019-09-30 MED ORDER — ALPRAZOLAM 0.5 MG PO TABS: 0.5000 mg | ORAL_TABLET | Freq: Two times a day (BID) | ORAL | Status: DC | PRN

## 2019-09-30 MED ORDER — DEXAMETHASONE SODIUM PHOSPHATE 10 MG/ML IJ SOLN
10.0000 mg | Freq: Three times a day (TID) | INTRAMUSCULAR | Status: AC
  Administered 2019-09-30 – 2019-10-01 (×3): 10 mg via INTRAVENOUS
  Filled 2019-09-30 (×3): qty 1

## 2019-09-30 MED ORDER — GABAPENTIN 300 MG PO CAPS
300.0000 mg | ORAL_CAPSULE | Freq: Every day | ORAL | Status: DC
  Administered 2019-09-30: 300 mg via ORAL
  Filled 2019-09-30: qty 1

## 2019-09-30 MED ORDER — LIDOCAINE 2% (20 MG/ML) 5 ML SYRINGE
INTRAMUSCULAR | Status: AC
  Filled 2019-09-30: qty 5

## 2019-09-30 MED ORDER — CLONIDINE HCL (ANALGESIA) 100 MCG/ML EP SOLN
EPIDURAL | Status: DC | PRN
  Administered 2019-09-30: 100 ug

## 2019-09-30 MED ORDER — TRANEXAMIC ACID-NACL 1000-0.7 MG/100ML-% IV SOLN
1000.0000 mg | INTRAVENOUS | Status: AC
  Administered 2019-09-30: 1000 mg via INTRAVENOUS
  Filled 2019-09-30: qty 100

## 2019-09-30 MED ORDER — PHENOL 1.4 % MT LIQD: 1.0000 | OROMUCOSAL | Status: DC | PRN

## 2019-09-30 MED ORDER — DULOXETINE HCL 60 MG PO CPEP
60.0000 mg | ORAL_CAPSULE | Freq: Every day | ORAL | Status: DC
  Administered 2019-09-30 – 2019-10-01 (×2): 60 mg via ORAL
  Filled 2019-09-30 (×2): qty 1

## 2019-09-30 MED ORDER — ONDANSETRON HCL 4 MG/2ML IJ SOLN: 4.0000 mg | Freq: Four times a day (QID) | INTRAMUSCULAR | Status: DC | PRN

## 2019-09-30 MED ORDER — PHENYLEPHRINE HCL (PRESSORS) 10 MG/ML IV SOLN
INTRAVENOUS | Status: AC
  Filled 2019-09-30: qty 1

## 2019-09-30 MED ORDER — 0.9 % SODIUM CHLORIDE (POUR BTL) OPTIME
TOPICAL | Status: DC | PRN
  Administered 2019-09-30: 1000 mL

## 2019-09-30 MED ORDER — SODIUM CHLORIDE 0.9 % IV SOLN
INTRAVENOUS | Status: AC | PRN
  Administered 2019-09-30: 1.5 g via INTRAVENOUS

## 2019-09-30 MED ORDER — PROMETHAZINE HCL 25 MG/ML IJ SOLN: 6.2500 mg | INTRAMUSCULAR | Status: DC | PRN

## 2019-09-30 MED ORDER — POTASSIUM CHLORIDE IN NACL 20-0.9 MEQ/L-% IV SOLN
INTRAVENOUS | Status: DC
  Administered 2019-09-30: 18:00:00 via INTRAVENOUS
  Filled 2019-09-30 (×2): qty 1000

## 2019-09-30 MED ORDER — MIDAZOLAM HCL 2 MG/2ML IJ SOLN
1.0000 mg | INTRAMUSCULAR | Status: DC
  Administered 2019-09-30 (×2): 1 mg via INTRAVENOUS

## 2019-09-30 MED ORDER — POVIDONE-IODINE 10 % EX SWAB
2.0000 "application " | Freq: Once | CUTANEOUS | Status: AC
  Administered 2019-09-30: 2 via TOPICAL

## 2019-09-30 MED ORDER — SODIUM CHLORIDE 0.9 % IR SOLN
Status: DC | PRN
  Administered 2019-09-30: 2000 mL

## 2019-09-30 MED ORDER — DEXAMETHASONE SODIUM PHOSPHATE 10 MG/ML IJ SOLN
INTRAMUSCULAR | Status: AC
  Filled 2019-09-30: qty 1

## 2019-09-30 MED ORDER — METOCLOPRAMIDE HCL 5 MG/ML IJ SOLN: 5.0000 mg | Freq: Three times a day (TID) | INTRAMUSCULAR | Status: DC | PRN

## 2019-09-30 MED ORDER — MIDAZOLAM HCL 2 MG/2ML IJ SOLN
INTRAMUSCULAR | Status: AC
  Administered 2019-09-30: 08:00:00 1 mg via INTRAVENOUS
  Filled 2019-09-30: qty 2

## 2019-09-30 MED ORDER — POLYETHYLENE GLYCOL 3350 17 G PO PACK
17.0000 g | PACK | Freq: Two times a day (BID) | ORAL | Status: DC
  Filled 2019-09-30: qty 1

## 2019-09-30 MED ORDER — ALUM & MAG HYDROXIDE-SIMETH 200-200-20 MG/5ML PO SUSP: 30.0000 mL | ORAL | Status: DC | PRN

## 2019-09-30 MED ORDER — WATER FOR IRRIGATION, STERILE IR SOLN
Status: DC | PRN
  Administered 2019-09-30: 2000 mL

## 2019-09-30 MED ORDER — BUPIVACAINE-EPINEPHRINE 0.25% -1:200000 IJ SOLN
INTRAMUSCULAR | Status: AC
  Filled 2019-09-30: qty 1

## 2019-09-30 MED ORDER — DIPHENHYDRAMINE HCL 12.5 MG/5ML PO ELIX: 12.5000 mg | ORAL_SOLUTION | ORAL | Status: DC | PRN

## 2019-09-30 MED ORDER — ROPIVACAINE HCL 7.5 MG/ML IJ SOLN
INTRAMUSCULAR | Status: DC | PRN
  Administered 2019-09-30 (×4): 5 mL via PERINEURAL

## 2019-09-30 MED ORDER — CEFAZOLIN SODIUM-DEXTROSE 2-4 GM/100ML-% IV SOLN
2.0000 g | INTRAVENOUS | Status: AC
  Administered 2019-09-30: 2 g via INTRAVENOUS
  Filled 2019-09-30: qty 100

## 2019-09-30 MED ORDER — ASPIRIN EC 325 MG PO TBEC
325.0000 mg | DELAYED_RELEASE_TABLET | Freq: Every day | ORAL | Status: DC
  Administered 2019-10-01: 08:00:00 325 mg via ORAL
  Filled 2019-09-30: qty 1

## 2019-09-30 MED ORDER — SODIUM CHLORIDE (PF) 0.9 % IJ SOLN
INTRAMUSCULAR | Status: DC | PRN
  Administered 2019-09-30: 50 mL

## 2019-09-30 MED ORDER — METOCLOPRAMIDE HCL 5 MG PO TABS: 5.0000 mg | ORAL_TABLET | Freq: Three times a day (TID) | ORAL | Status: DC | PRN

## 2019-09-30 MED ORDER — BUPIVACAINE IN DEXTROSE 0.75-8.25 % IT SOLN
INTRATHECAL | Status: DC | PRN
  Administered 2019-09-30: 1.6 mL via INTRATHECAL

## 2019-09-30 MED ORDER — HYDROMORPHONE HCL 1 MG/ML IJ SOLN: 0.2500 mg | INTRAMUSCULAR | Status: DC | PRN

## 2019-09-30 MED ORDER — FENTANYL CITRATE (PF) 100 MCG/2ML IJ SOLN
50.0000 ug | INTRAMUSCULAR | Status: DC
  Administered 2019-09-30 (×2): 50 ug via INTRAVENOUS

## 2019-09-30 MED ORDER — PROPOFOL 500 MG/50ML IV EMUL
INTRAVENOUS | Status: DC | PRN
  Administered 2019-09-30: 50 ug/kg/min via INTRAVENOUS

## 2019-09-30 MED ORDER — EPHEDRINE 5 MG/ML INJ
INTRAVENOUS | Status: AC
  Filled 2019-09-30: qty 20

## 2019-09-30 MED ORDER — CEFUROXIME SODIUM 1.5 G IV SOLR
1.5000 g | Freq: Once | INTRAVENOUS | Status: DC
  Filled 2019-09-30: qty 1.5

## 2019-09-30 MED ORDER — POVIDONE-IODINE 10 % EX SWAB: 2.0000 "application " | Freq: Once | CUTANEOUS | Status: DC

## 2019-09-30 MED ORDER — HYDROMORPHONE HCL 1 MG/ML IJ SOLN: 0.5000 mg | INTRAMUSCULAR | Status: DC | PRN

## 2019-09-30 MED ORDER — MIDAZOLAM HCL 2 MG/2ML IJ SOLN
INTRAMUSCULAR | Status: AC
  Filled 2019-09-30: qty 2

## 2019-09-30 MED ORDER — EPHEDRINE SULFATE-NACL 50-0.9 MG/10ML-% IV SOSY
PREFILLED_SYRINGE | INTRAVENOUS | Status: DC | PRN
  Administered 2019-09-30 (×10): 10 mg via INTRAVENOUS

## 2019-09-30 MED ORDER — PROPOFOL 10 MG/ML IV BOLUS
INTRAVENOUS | Status: DC | PRN
  Administered 2019-09-30: 20 mg via INTRAVENOUS

## 2019-09-30 SURGICAL SUPPLY — 65 items
ATTUNE MED DOME PAT 38 KNEE (Knees) ×2 IMPLANT
ATTUNE MED DOME PAT 38MM KNEE (Knees) ×1 IMPLANT
ATTUNE PS FEM RT SZ 7 CEM KNEE (Femur) ×3 IMPLANT
ATTUNE PSRP INSR SZ7 7 KNEE (Insert) ×2 IMPLANT
ATTUNE PSRP INSR SZ7 7MM KNEE (Insert) ×1 IMPLANT
BAG ZIPLOCK 12X15 (MISCELLANEOUS) ×3 IMPLANT
BASE TIBIAL ROT PLAT SZ 8 KNEE (Knees) ×1 IMPLANT
BLADE HEX COATED 2.75 (ELECTRODE) ×3 IMPLANT
BLADE SAGITTAL 25.0X1.19X90 (BLADE) ×2 IMPLANT
BLADE SAGITTAL 25.0X1.19X90MM (BLADE) ×1
BLADE SAW SGTL 13X75X1.27 (BLADE) ×3 IMPLANT
BLADE SURG SZ10 CARB STEEL (BLADE) ×6 IMPLANT
BNDG ELASTIC 6X10 VLCR STRL LF (GAUZE/BANDAGES/DRESSINGS) ×3 IMPLANT
BOWL SMART MIX CTS (DISPOSABLE) ×3 IMPLANT
CEMENT HV SMART SET (Cement) ×6 IMPLANT
CHLORAPREP W/TINT 26 (MISCELLANEOUS) ×6 IMPLANT
CLOSURE WOUND 1/2 X4 (GAUZE/BANDAGES/DRESSINGS) ×1
COVER SURGICAL LIGHT HANDLE (MISCELLANEOUS) ×3 IMPLANT
COVER WAND RF STERILE (DRAPES) ×3 IMPLANT
CUFF TOURN SGL QUICK 34 (TOURNIQUET CUFF) ×2
CUFF TRNQT CYL 34X4.125X (TOURNIQUET CUFF) ×1 IMPLANT
DECANTER SPIKE VIAL GLASS SM (MISCELLANEOUS) ×6 IMPLANT
DRAPE ORTHO SPLIT 77X108 STRL (DRAPES) ×2
DRAPE SHEET LG 3/4 BI-LAMINATE (DRAPES) ×6 IMPLANT
DRAPE SURG ORHT 6 SPLT 77X108 (DRAPES) ×1 IMPLANT
DRAPE U-SHAPE 47X51 STRL (DRAPES) ×3 IMPLANT
DRSG AQUACEL AG ADV 3.5X10 (GAUZE/BANDAGES/DRESSINGS) ×3 IMPLANT
ELECT REM PT RETURN 15FT ADLT (MISCELLANEOUS) ×3 IMPLANT
GLOVE BIO SURGEON STRL SZ7 (GLOVE) ×3 IMPLANT
GLOVE BIOGEL PI IND STRL 7.0 (GLOVE) ×1 IMPLANT
GLOVE BIOGEL PI IND STRL 7.5 (GLOVE) ×1 IMPLANT
GLOVE BIOGEL PI INDICATOR 7.0 (GLOVE) ×2
GLOVE BIOGEL PI INDICATOR 7.5 (GLOVE) ×2
GLOVE SS BIOGEL STRL SZ 7.5 (GLOVE) ×2 IMPLANT
GLOVE SUPERSENSE BIOGEL SZ 7.5 (GLOVE) ×4
GLOVE SURG SYN 7.0 (GLOVE) ×6 IMPLANT
GOWN STRL REUS W/ TWL LRG LVL3 (GOWN DISPOSABLE) ×2 IMPLANT
GOWN STRL REUS W/TWL LRG LVL3 (GOWN DISPOSABLE) ×4
HANDPIECE INTERPULSE COAX TIP (DISPOSABLE) ×2
HOLDER FOLEY CATH W/STRAP (MISCELLANEOUS) ×3 IMPLANT
HOOD PEEL AWAY FLYTE STAYCOOL (MISCELLANEOUS) ×9 IMPLANT
KIT TURNOVER KIT A (KITS) ×3 IMPLANT
MANIFOLD NEPTUNE II (INSTRUMENTS) ×3 IMPLANT
MARKER SKIN DUAL TIP RULER LAB (MISCELLANEOUS) ×3 IMPLANT
NEEDLE HYPO 22GX1.5 SAFETY (NEEDLE) ×3 IMPLANT
NS IRRIG 1000ML POUR BTL (IV SOLUTION) ×3 IMPLANT
PACK TOTAL KNEE CUSTOM (KITS) ×3 IMPLANT
PIN STEINMAN FIXATION KNEE (PIN) ×3 IMPLANT
PIN THREADED HEADED SIGMA (PIN) ×3 IMPLANT
PROTECTOR NERVE ULNAR (MISCELLANEOUS) ×3 IMPLANT
SET HNDPC FAN SPRY TIP SCT (DISPOSABLE) ×1 IMPLANT
STAPLER VISISTAT 35W (STAPLE) IMPLANT
STRIP CLOSURE SKIN 1/2X4 (GAUZE/BANDAGES/DRESSINGS) ×2 IMPLANT
SUT MNCRL AB 3-0 PS2 18 (SUTURE) ×3 IMPLANT
SUT VIC AB 0 CT1 36 (SUTURE) ×6 IMPLANT
SUT VIC AB 1 CT1 36 (SUTURE) ×3 IMPLANT
SUT VIC AB 2-0 CT1 27 (SUTURE) ×4
SUT VIC AB 2-0 CT1 TAPERPNT 27 (SUTURE) ×2 IMPLANT
SYR CONTROL 10ML LL (SYRINGE) ×6 IMPLANT
TIBIAL BASE ROT PLAT SZ 8 KNEE (Knees) ×3 IMPLANT
TRAY FOLEY MTR SLVR 14FR STAT (SET/KITS/TRAYS/PACK) IMPLANT
TRAY FOLEY MTR SLVR 16FR STAT (SET/KITS/TRAYS/PACK) ×3 IMPLANT
WATER STERILE IRR 1000ML POUR (IV SOLUTION) ×6 IMPLANT
YANKAUER SUCT BULB TIP 10FT TU (MISCELLANEOUS) ×3 IMPLANT
YANKAUER SUCT BULB TIP NO VENT (SUCTIONS) ×3 IMPLANT

## 2019-09-30 NOTE — Anesthesia Procedure Notes (Signed)
Anesthesia Regional Block: Adductor canal block   Pre-Anesthetic Checklist: ,, timeout performed, Correct Patient, Correct Site, Correct Laterality, Correct Procedure, Correct Position, site marked, Risks and benefits discussed,  Surgical consent,  Pre-op evaluation,  At surgeon's request and post-op pain management  Laterality: Lower and Right  Prep: chloraprep       Needles:  Injection technique: Single-shot  Needle Type: Echogenic Stimulator Needle     Needle Length: 10cm  Needle Gauge: 21   Needle insertion depth: 2 cm   Additional Needles:   Procedures:,,,, ultrasound used (permanent image in chart),,,,  Narrative:  Start time: 09/30/2019 7:55 AM End time: 09/30/2019 8:05 AM Injection made incrementally with aspirations every 5 mL.  Performed by: Personally  Anesthesiologist: Lyn Hollingshead, MD

## 2019-09-30 NOTE — Anesthesia Procedure Notes (Addendum)
Spinal  Patient location during procedure: OR Start time: 09/30/2019 8:48 AM End time: 09/30/2019 8:50 AM Staffing Anesthesiologist: Lyn Hollingshead, MD Performed: anesthesiologist  Preanesthetic Checklist Completed: patient identified, site marked, surgical consent, pre-op evaluation, timeout performed, IV checked, risks and benefits discussed and monitors and equipment checked Spinal Block Patient position: sitting Prep: site prepped and draped and DuraPrep Patient monitoring: continuous pulse ox and blood pressure Approach: midline Location: L3-4 Injection technique: single-shot Needle Needle type: Pencan  Needle gauge: 24 G Needle length: 10 cm Needle insertion depth: 6 cm Assessment Sensory level: T8

## 2019-09-30 NOTE — Anesthesia Postprocedure Evaluation (Signed)
Anesthesia Post Note  Patient: Paul [name]  Procedure(s) Performed: TOTAL KNEE ARTHROPLASTY (Right Knee)     Patient location during evaluation: PACU Anesthesia Type: Spinal Level of consciousness: awake Pain management: pain level controlled Vital Signs Assessment: post-procedure vital signs reviewed and stable Respiratory status: spontaneous breathing Cardiovascular status: stable Postop Assessment: no headache, no backache, spinal receding, patient able to bend at knees and no apparent nausea or vomiting Anesthetic complications: no    Last Vitals:  Vitals:   09/30/19 1115 09/30/19 1130  BP: 121/74 118/70  Pulse: 79 81  Resp: 17 14  Temp:  (!) 36.4 C  SpO2: 100% 100%    Last Pain:  Vitals:   09/30/19 1130  TempSrc:   PainSc: 0-No pain   Pain Goal: Patients Stated Pain Goal: 5 (09/30/19 0720)  LLE Motor Response: Purposeful movement, Responds to commands (09/30/19 1130) LLE Sensation: Numbness (09/30/19 1130) RLE Motor Response: Purposeful movement, Responds to commands (09/30/19 1130) RLE Sensation: Numbness (09/30/19 1130) L Sensory Level: L4-Anterior knee, lower leg (09/30/19 1130) R Sensory Level: L4-Anterior knee, lower leg (09/30/19 1130) Epidural/Spinal Function Cutaneous sensation: Able to Discern Pressure (09/30/19 1130), Patient able to flex knees: Yes (09/30/19 1130), Patient able to lift hips off bed: No (09/30/19 1130), Back pain beyond tenderness at insertion site: No (09/30/19 1130), Progressively worsening motor and/or sensory loss: No (09/30/19 1130), Bowel and/or bladder incontinence post epidural: No (09/30/19 1130)  Huston Foley

## 2019-09-30 NOTE — Anesthesia Procedure Notes (Signed)
Procedure Name: MAC Date/Time: 09/30/2019 8:48 AM Performed by: Eben Burow, CRNA Pre-anesthesia Checklist: Patient identified, Emergency Drugs available, Suction available, Patient being monitored and Timeout performed Oxygen Delivery Method: Simple face mask Dental Injury: Teeth and Oropharynx as per pre-operative assessment

## 2019-09-30 NOTE — Progress Notes (Signed)
Assisted Dr. Hatchett with right, ultrasound guided, adductor canal block. Side rails up, monitors on throughout procedure. See vital signs in flow sheet. Tolerated Procedure well.  

## 2019-09-30 NOTE — Op Note (Signed)
MRN:     [id number] DOB/AGE:    1951-07-01 / 68 y.o.       OPERATIVE REPORT   DATE OF PROCEDURE:  09/30/2019      PREOPERATIVE DIAGNOSIS:   Primary Localized Osteoarthritis right Knee       Estimated body mass index is 25.16 kg/m as calculated from the following:   Height as of this encounter: 5\' 11"  (1.803 m).   Weight as of this encounter: 81.8 kg.                                                       POSTOPERATIVE DIAGNOSIS:   Same                                                                 PROCEDURE:  Procedure(s): TOTAL KNEE ARTHROPLASTY Using Depuy Attune RP implants #7 Femur, #8Tibia, 7mm  RP bearing, 38 Patella    SURGEON: Dalesha Stanback A. Thurston Hole, MD   ASSISTANT: Julien Girt, PA-C, present and scrubbed throughout the case, critical for retraction, instrumentation, and closure.  ANESTHESIA: Spinal with Adductor Nerve Block  TOURNIQUET TIME: 65 minutes   COMPLICATIONS:  None       SPECIMENS: None   INDICATIONS FOR PROCEDURE: The patient has djd of the knee with varus deformities, XR shows bone on bone arthritis. Patient has failed all conservative measures including anti-inflammatory medicines, narcotics, attempts at exercise and weight loss, cortisone injections and viscosupplementation.  Risks and benefits of surgery have been discussed, questions answered.    DESCRIPTION OF PROCEDURE: The patient identified by armband, received right adductor canal block and IV antibiotics, in the holding area at [facility]. Patient taken to the operating room, appropriate anesthetic monitors were attached. Spinal anesthesia induced with the patient in supine position, Foley catheter was inserted. Tourniquet applied high to the operative thigh. Lateral post and foot positioner applied to the table, the lower extremity was then prepped and draped in usual sterile fashion from the ankle to the tourniquet. Time-out procedure was performed. The limb was wrapped with an Esmarch  bandage and the tourniquet inflated to 365 mmHg.   We began the operation by making a 6cm anterior midline incision. Small bleeders in the skin and the subcutaneous tissue identified and cauterized. Transverse retinaculum was incised and reflected medially and a medial parapatellar arthrotomy was accomplished. the patella was everted and theprepatellar fat pad resected. The superficial medial collateral ligament was then elevated from anterior to posterior along the proximal flare of the tibia and anterior half of the menisci resected. The knee was hyperflexed exposing bone on bone arthritis. Peripheral and notch osteophytes as well as the cruciate ligaments were then resected. We continued to work our way around posteriorly along the proximal tibia, and externally rotated the tibia subluxing it out from underneath the femur. A McHale retractor was placed through the notch and a lateral Hohmann retractor placed, and an external tibial guide was placed.  The tibial cutting guide was pinned into place allowing resection of 4 mm of bone medially and about 6 mm of bone laterally because of her  varus deformity.   Satisfied with the tibial resection, we then entered the distal femur 2 mm anterior to the PCL origin with the intramedullary guide rod and applied the distal femoral cutting guide set at 11mm, with 5 degrees of valgus. This was pinned along the epicondylar axis. At this point, the distal femoral cut was accomplished without difficulty. We then sized for a 7 femoral component and pinned the guide in 3 degrees of external rotation.The chamfer cutting guide was pinned into place. The anterior, posterior, and chamfer cuts were accomplished without difficulty followed by the  RP box cutting guide and the box cut. We also removed posterior osteophytes from the posterior femoral condyles. At this time, the knee was brought into full extension. We checked our extension and flexion gaps and found them symmetric at  7.  The patella thickness measured at 70m m. We set the cutting guide at 15 and removed the posterior patella sized for 38 button and drilled the lollipop. The knee was then once again hyperflexed exposing the proximal tibia. We sized for a # 8 tibial base plate, applied the smokestack and the conical reamer followed by the the Delta fin keel punch. We then hammered into place the  RP trial femoral component, inserted a trial bearing, trial patellar button, and took the knee through range of motion from 0-130 degrees. No thumb pressure was required for patellar tracking.   At this point, all trial components were removed, a double batch of DePuy HV cement with Zinacef 1.5gms  was mixed and applied to all bony metallic mating surfaces. In order, we hammered into place the tibial tray and removed excess cement, the femoral component and removed excess cement, a 7 mm  RP bearing was inserted, and the knee brought to full extension with compression. The patellar button was clamped into place, and excess cement removed. While the cement cured the wound was irrigated out with normal saline solution pulse lavage, and exparel was injected throughout the knee. Ligament stability and patellar tracking were checked and found to be excellent..   The parapatellar arthrotomy was closed with  #1 Vicryl suture. The subcutaneous tissue with 0 and 2-0 undyed Vicryl suture, and 4-0 Monocryl.. A dressing of Aquaseal, 4 x 4, dressing sponges, Webril, and Ace wrap applied. Needle and sponge count were correct times 2.The patient awakened, extubated, and taken to recovery room without difficulty. Vascular status was normal, pulses 2+ and symmetric.    Nilda Simmer 02/12/2018, 8:56 AM

## 2019-09-30 NOTE — Interval H&P Note (Signed)
History and Physical Interval Note:  09/30/2019 8:38 AM  Paul [name]  has presented today for surgery, with the diagnosis of DJD RIGHT KNEE.  The various methods of treatment have been discussed with the patient and family. After consideration of risks, benefits and other options for treatment, the patient has consented to  Procedure(s): TOTAL KNEE ARTHROPLASTY (Right) as a surgical intervention.  The patient's history has been reviewed, patient examined, no change in status, stable for surgery.  I have reviewed the patient's chart and labs.  Questions were answered to the patient's satisfaction.     Lorn Junes

## 2019-09-30 NOTE — Plan of Care (Signed)
Continue with current POC

## 2019-09-30 NOTE — Evaluation (Signed)
Physical Therapy Evaluation Patient Details Name: SELDON ILLES MRN: [id number] DOB: 1951-11-15 Today's Date: 09/30/2019   History of Present Illness  s/p R TKA. PMH: L TKA, Hep C  Clinical Impression  Pt is s/p TKA resulting in the deficits listed below (see PT Problem List).  Pt cooperative and motivated, stand pivot only d/t significant quad lag on R/residual effects of adductor block.   Pt will benefit from skilled PT to increase their independence and safety with mobility to allow discharge to the venue listed below.      Follow Up Recommendations Follow surgeon's recommendation for DC plan and follow-up therapies    Equipment Recommendations  None recommended by PT    Recommendations for Other Services       Precautions / Restrictions Precautions Precautions: Knee Restrictions Weight Bearing Restrictions: No Other Position/Activity Restrictions: WBAT      Mobility  Bed Mobility Overal bed mobility: Needs Assistance Bed Mobility: Supine to Sit     Supine to sit: Min guard     General bed mobility comments: for safety  Transfers Overall transfer level: Needs assistance Equipment used: Rolling walker (2 wheeled) Transfers: Sit to/from [employer] Sit to Stand: Min guard         General transfer comment: cues for hand placement, safety/WBing on LLE and UEs to compensate for quad weakness/effects of block  Ambulation/Gait             General Gait Details: NT with +1 assist d/t residual effects of adductor block/safety concerns  Stairs            Wheelchair Mobility    Modified Rankin (Stroke Patients Only)       Balance                                             Pertinent Vitals/Pain Pain Assessment: 0-10 Pain Score: 3  Pain Location: right knee and thigh Pain Descriptors / Indicators: Aching;Grimacing;Sore Pain Intervention(s): Limited activity within patient's tolerance;Monitored during  session;Repositioned;Premedicated before session    Home Living Family/patient expects to be discharged to:: Private residence Living Arrangements: Alone   Type of Home: House Home Access: Stairs to enter   [employer] of Steps: 2 Home Layout: One level Home Equipment: Environmental consultant - 2 wheels;Cane - single point;Bedside commode      Prior Function Level of Independence: Independent               Hand Dominance        Extremity/Trunk Assessment   Upper Extremity Assessment Upper Extremity Assessment: Overall WFL for tasks assessed    Lower Extremity Assessment Lower Extremity Assessment: RLE deficits/detail RLE Deficits / Details: ankle grossly WFL; knee flexion 3+/5, significant quad lag (~ 40 degrees)d/t adductor block       Communication   Communication: No difficulties  Cognition Arousal/Alertness: Awake/alert Behavior During Therapy: WFL for tasks assessed/performed Overall Cognitive Status: Within Functional Limits for tasks assessed                                        General Comments      Exercises Total Joint Exercises Ankle Circles/Pumps: AROM;10 reps;Both Quad Sets: (instructed in quad sets when able)   Assessment/Plan    PT Assessment Patient needs continued  PT services  PT Problem List Decreased strength;Decreased range of motion;Decreased activity tolerance;Decreased mobility;Decreased knowledge of use of DME       PT Treatment Interventions DME instruction;Therapeutic exercise;Gait training;Functional mobility training;Therapeutic activities;Patient/family education;Stair training    PT Goals (Current goals can be found in the Care Plan section)  Acute Rehab PT Goals PT Goal Formulation: With patient Time For Goal Achievement: 10/07/19 Potential to Achieve Goals: Good    Frequency 7X/week   Barriers to discharge        Co-evaluation               AM-PAC PT "6 Clicks" Mobility  Outcome Measure  Help needed turning from your back to your side while in a flat bed without using bedrails?: A Little Help needed moving from lying on your back to sitting on the side of a flat bed without using bedrails?: A Little Help needed moving to and from a bed to a chair (including a wheelchair)?: A Lot Help needed standing up from a chair using your arms (e.g., wheelchair or bedside chair)?: A Lot Help needed to walk in hospital room?: A Lot Help needed climbing 3-5 steps with a railing? : A Lot 6 Click Score: 14    End of Session Equipment Utilized During Treatment: Gait belt Activity Tolerance: Patient tolerated treatment well;Other (comment)(limited by adductor block effects) Patient left: in chair;with chair alarm set;with call bell/phone within reach;with family/visitor present   PT Visit Diagnosis: Difficulty in walking, not elsewhere classified (R26.2)    Time: 3474-2595 PT Time Calculation (min) (ACUTE ONLY): 19 min   Charges:   PT Evaluation $PT Eval Low Complexity: 1 Low          Drucilla Chalet, PT  Pager: [phone] Acute Rehab Dept [facility]): [phone]   09/30/2019   [facility] 09/30/2019, 3:26 PM

## 2019-09-30 NOTE — Transfer of Care (Signed)
Immediate Anesthesia Transfer of Care Note  Patient: Paul [name]  Procedure(s) Performed: TOTAL KNEE ARTHROPLASTY (Right Knee)  Patient Location: PACU  Anesthesia Type:Spinal  Level of Consciousness: awake, drowsy and patient cooperative  Airway & Oxygen Therapy: Patient Spontanous Breathing and Patient connected to face mask oxygen  Post-op Assessment: Report given to RN and Post -op Vital signs reviewed and stable  Post vital signs: Reviewed and stable  Last Vitals:  Vitals Value Taken Time  BP 114/77 09/30/19 1054  Temp    Pulse 82 09/30/19 1058  Resp 16 09/30/19 1058  SpO2 100 % 09/30/19 1058  Vitals shown include unvalidated device data.  Last Pain:  Vitals:   09/30/19 0825  TempSrc:   PainSc: 0-No pain      Patients Stated Pain Goal: 5 (22/63/33 5456)  Complications: No apparent anesthesia complications

## 2019-10-01 ENCOUNTER — Encounter (HOSPITAL_COMMUNITY): Payer: Self-pay | Admitting: Orthopedic Surgery

## 2019-10-01 ENCOUNTER — Other Ambulatory Visit: Payer: Self-pay | Admitting: Internal Medicine

## 2019-10-01 LAB — BASIC METABOLIC PANEL
Anion gap: 10 (ref 5–15)
BUN: 16 mg/dL (ref 8–23)
CO2: 23 mmol/L (ref 22–32)
Calcium: 8.9 mg/dL (ref 8.9–10.3)
Chloride: 104 mmol/L (ref 98–111)
Creatinine, Ser: 0.82 mg/dL (ref 0.61–1.24)
GFR calc Af Amer: >60 mL/min (ref >60)
GFR calc non Af Amer: >60 mL/min (ref >60)
Glucose, Bld: 155 mg/dL — ABNORMAL HIGH (ref 70–99)
Potassium: 3.9 mmol/L (ref 3.5–5.1)
Sodium: 137 mmol/L (ref 135–145)

## 2019-10-01 LAB — CBC
HCT: 38.1 % — ABNORMAL LOW (ref 39.0–52.0)
Hemoglobin: 13 g/dL (ref 13.0–17.0)
MCH: 31.1 pg (ref 26.0–34.0)
MCHC: 34.1 g/dL (ref 30.0–36.0)
MCV: 91.1 fL (ref 80.0–100.0)
Platelets: 209 10*3/uL (ref 150–400)
RBC: 4.18 MIL/uL — ABNORMAL LOW (ref 4.22–5.81)
RDW: 13 % (ref 11.5–15.5)
WBC: 13.5 10*3/uL — ABNORMAL HIGH (ref 4.0–10.5)
nRBC: 0 % (ref 0.0–0.2)

## 2019-10-01 MED ORDER — POLYETHYLENE GLYCOL 3350 17 G PO PACK: 17.0000 g | PACK | Freq: Two times a day (BID) | ORAL | 0 refills | Status: DC

## 2019-10-01 MED ORDER — OXYCODONE HCL 5 MG PO TABS: ORAL_TABLET | ORAL | 0 refills | Status: DC

## 2019-10-01 MED ORDER — DOCUSATE SODIUM 100 MG PO CAPS: ORAL_CAPSULE | ORAL | 0 refills | Status: DC

## 2019-10-01 MED ORDER — GABAPENTIN 300 MG PO CAPS: ORAL_CAPSULE | ORAL | 0 refills | Status: DC

## 2019-10-01 MED ORDER — SODIUM CHLORIDE 0.9 % IV BOLUS
500.0000 mL | Freq: Once | INTRAVENOUS | Status: AC
  Administered 2019-10-01: 10:00:00 500 mL via INTRAVENOUS

## 2019-10-01 MED ORDER — ASPIRIN 325 MG PO TBEC: 325.0000 mg | DELAYED_RELEASE_TABLET | Freq: Every day | ORAL | 0 refills | Status: DC

## 2019-10-01 NOTE — Plan of Care (Signed)
resolved 

## 2019-10-01 NOTE — Discharge Summary (Signed)
Patient ID: Paul [name] MRN: [id number] DOB/AGE: Jun 08, 1951 68 y.o.  Admit date: 09/30/2019 Discharge date: 10/01/2019  Admission Diagnoses:  Principal Problem:   Primary localized osteoarthritis of right knee Active Problems:   Depression   History of hepatitis C   S/P TKR (total knee replacement), left   11/12/2018   Discharge Diagnoses:  Same  Past Medical History:  Diagnosis Date  . Anxiety   . Depression   . Hepatitis    Hep C treated 12 years ago viral load negative after a year of treatment   . Primary localized osteoarthritis of right knee 09/19/2019  . S/P TKR (total knee replacement), left   11/12/2018 09/19/2019    Surgeries: Procedure(s): TOTAL KNEE ARTHROPLASTY on 09/30/2019   Consultants:   Discharged Condition: Improved  Hospital Course: Paul [name] is an 68 y.o. male who was admitted 09/30/2019 for operative treatment ofPrimary localized osteoarthritis of right knee. Patient has severe unremitting pain that affects sleep, daily activities, and work/hobbies. After pre-op clearance the patient was taken to the operating room on 09/30/2019 and underwent  Procedure(s): TOTAL KNEE ARTHROPLASTY.    Patient was given perioperative antibiotics:  Anti-infectives (From admission, onward)   Start     Dose/Rate Route Frequency Ordered Stop   09/30/19 1500  ceFAZolin (ANCEF) IVPB 2g/100 mL premix     2 g 200 mL/hr over 30 Minutes Intravenous Every 6 hours 09/30/19 1206 09/30/19 2138   09/30/19 1011  cefUROXime (ZINACEF) 1.5 g in sodium chloride 0.9 % 100 mL IVPB     over 30 Minutes  Continuous PRN 09/30/19 1011 09/30/19 1011   09/30/19 0745  cefUROXime (ZINACEF) injection 1.5 g     1.5 g Intravenous  Once 09/30/19 0737     09/30/19 0715  ceFAZolin (ANCEF) IVPB 2g/100 mL premix     2 g 200 mL/hr over 30 Minutes Intravenous On call to O.R. 09/30/19 4098 09/30/19 0851       Patient was given sequential compression devices, early ambulation, and chemoprophylaxis to  prevent DVT.  Patient benefited maximally from hospital stay and there were no complications.    Recent vital signs:  Patient Vitals for the past 24 hrs:  BP Temp Temp src Pulse Resp SpO2  10/01/19 0930 (!) 147/72 98.3 F (36.8 C) Oral 79 16 99 %  10/01/19 0504 113/76 97.9 F (36.6 C) Oral 71 14 99 %  10/01/19 0057 120/76 97.8 F (36.6 C) Oral 84 16 99 %  09/30/19 2200 122/74 98.1 F (36.7 C) Oral 79 14 98 %  09/30/19 1734 123/75 98.5 F (36.9 C) - 83 - 99 %  09/30/19 1521 123/70 - - 92 15 97 %  09/30/19 1404 127/82 - - 77 15 100 %  09/30/19 1304 115/70 97.9 F (36.6 C) - 79 15 100 %  09/30/19 1154 117/73 97.7 F (36.5 C) - 73 16 100 %  09/30/19 1130 118/70 (!) 97.5 F (36.4 C) - 81 14 100 %  09/30/19 1115 121/74 - - 79 17 100 %     Recent laboratory studies:  Recent Labs    10/01/19 0204  WBC 13.5*  HGB 13.0  HCT 38.1*  PLT 209  NA 137  K 3.9  CL 104  CO2 23  BUN 16  CREATININE 0.82  GLUCOSE 155*  CALCIUM 8.9     Discharge Medications:   Allergies as of 10/01/2019   No Known Allergies     Medication List    STOP taking  these medications   amoxicillin 500 MG capsule Commonly known as: AMOXIL   clarithromycin 500 MG tablet Commonly known as: Biaxin     TAKE these medications   ALPRAZolam 0.5 MG tablet Commonly known as: Xanax Take 1 tablet (0.5 mg total) by mouth 2 (two) times daily as needed for anxiety.   aspirin 325 MG EC tablet Take 1 tablet (325 mg total) by mouth daily with breakfast. Start taking on: October 02, 2019   docusate sodium 100 MG capsule Commonly known as: COLACE 1 tab 2 times a day while on narcotics.  STOOL SOFTENER   DULoxetine 60 MG capsule Commonly known as: CYMBALTA TAKE 1 CAPSULE BY MOUTH EVERY DAY What changed:   how much to take  when to take this   gabapentin 300 MG capsule Commonly known as: NEURONTIN 1 po qhs at bedtime for nerve pain   oxyCODONE 5 MG immediate release tablet Commonly known as: Oxy  IR/ROXICODONE 1 po q 4 hrs prn pain.  Patient had a total knee replacement surgery on 09/30/2019   pantoprazole 40 MG tablet Commonly known as: PROTONIX 1 tablet twice daily for 14 days for H. pylori.   polyethylene glycol 17 g packet Commonly known as: MIRALAX / GLYCOLAX Take 17 g by mouth 2 (two) times daily.            Discharge Care Instructions  (From admission, onward)         Start     Ordered   10/01/19 0000  Change dressing    Comments: DO NOT REMOVE BANDAGE OVER SURGICAL INCISION.  WASH WHOLE LEG INCLUDING OVER THE WATERPROOF BANDAGE WITH SOAP AND WATER EVERY DAY.   10/01/19 1101          Diagnostic Studies: No results found.  Disposition: Discharge disposition: 01-Home or Self Care       Discharge Instructions    CPM   Complete by: As directed    Continuous passive motion machine (CPM):      Use the CPM from 0 to 90 for 6 hours per day.       You may break it up into 2 or 3 sessions per day.      Use CPM for 2 weeks or until you are told to stop.   Call MD / Call 911   Complete by: As directed    If you experience chest pain or shortness of breath, CALL 911 and be transported to the hospital emergency room.  If you develope a fever above 101 F, pus (white drainage) or increased drainage or redness at the wound, or calf pain, call your surgeon's office.   Change dressing   Complete by: As directed    DO NOT REMOVE BANDAGE OVER SURGICAL INCISION.  WASH WHOLE LEG INCLUDING OVER THE WATERPROOF BANDAGE WITH SOAP AND WATER EVERY DAY.   Constipation Prevention   Complete by: As directed    Drink plenty of fluids.  Prune juice may be helpful.  You may use a stool softener, such as Colace (over the counter) 100 mg twice a day.  Use MiraLax (over the counter) for constipation as needed.   Diet - low sodium heart healthy   Complete by: As directed    Discharge instructions   Complete by: As directed    INSTRUCTIONS AFTER JOINT REPLACEMENT   Patient is not  having home health physical therapy    He starts outpatient physical therapy at Dr Sherene Sires office tomorrow am  Remove items  at home which could result in a fall. This includes throw rugs or furniture in walking pathways ICE to the affected joint every three hours while awake for 30 minutes at a time, for at least the first 3-5 days, and then as needed for pain and swelling.  Continue to use ice for pain and swelling. You may notice swelling that will progress down to the foot and ankle.  This is normal after surgery.  Elevate your leg when you are not up walking on it.   Continue to use the breathing machine you got in the hospital (incentive spirometer) which will help keep your temperature down.  It is common for your temperature to cycle up and down following surgery, especially at night when you are not up moving around and exerting yourself.  The breathing machine keeps your lungs expanded and your temperature down.   DIET:  As you were doing prior to hospitalization, we recommend a well-balanced diet.  DRESSING / WOUND CARE / SHOWERING  Keep the surgical dressing until follow up.  The dressing is water proof, so you can shower without any extra covering.  IF THE DRESSING FALLS OFF or the wound gets wet inside, change the dressing with sterile gauze.  Please use good hand washing techniques before changing the dressing.  Do not use any lotions or creams on the incision until instructed by your surgeon.    ACTIVITY  Increase activity slowly as tolerated, but follow the weight bearing instructions below.   No driving for 6 weeks or until further direction given by your physician.  You cannot drive while taking narcotics.  No lifting or carrying greater than 10 lbs. until further directed by your surgeon. Avoid periods of inactivity such as sitting longer than an hour when not asleep. This helps prevent blood clots.  You may return to work once you are authorized by your doctor.     WEIGHT  BEARING   Weight bearing as tolerated with assist device (walker, cane, etc) as directed, use it as long as suggested by your surgeon or therapist, typically at least 2-3 weeks.   EXERCISES  Results after joint replacement surgery are often greatly improved when you follow the exercise, range of motion and muscle strengthening exercises prescribed by your doctor. Safety measures are also important to protect the joint from further injury. Any time any of these exercises cause you to have increased pain or swelling, decrease what you are doing until you are comfortable again and then slowly increase them. If you have problems or questions, call your caregiver or physical therapist for advice.   Rehabilitation is important following a joint replacement. After just a few days of immobilization, the muscles of the leg can become weakened and shrink (atrophy).  These exercises are designed to build up the tone and strength of the thigh and leg muscles and to improve motion. Often times heat used for twenty to thirty minutes before working out will loosen up your tissues and help with improving the range of motion but do not use heat for the first two weeks following surgery (sometimes heat can increase post-operative swelling).   These exercises can be done on a training (exercise) mat, on the floor, on a table or on a bed. Use whatever works the best and is most comfortable for you.    Use music or television while you are exercising so that the exercises are a pleasant break in your day. This will make your life better with  the exercises acting as a break in your routine that you can look forward to.   Perform all exercises about fifteen times, three times per day or as directed.  You should exercise both the operative leg and the other leg as well.   Exercises include:  Quad Sets - Tighten up the muscle on the front of the thigh (Quad) and hold for 5-10 seconds.   Straight Leg Raises - With your knee  straight (if you were given a brace, keep it on), lift the leg to 60 degrees, hold for 3 seconds, and slowly lower the leg.  Perform this exercise against resistance later as your leg gets stronger.  Leg Slides: Lying on your back, slowly slide your foot toward your buttocks, bending your knee up off the floor (only go as far as is comfortable). Then slowly slide your foot back down until your leg is flat on the floor again.  Angel Wings: Lying on your back spread your legs to the side as far apart as you can without causing discomfort.  Hamstring Strength:  Lying on your back, push your heel against the floor with your leg straight by tightening up the muscles of your buttocks.  Repeat, but this time bend your knee to a comfortable angle, and push your heel against the floor.  You may put a pillow under the heel to make it more comfortable if necessary.   A rehabilitation program following joint replacement surgery can speed recovery and prevent re-injury in the future due to weakened muscles. Contact your doctor or a physical therapist for more information on knee rehabilitation.    CONSTIPATION  Constipation is defined medically as fewer than three stools per week and severe constipation as less than one stool per week.  Even if you have a regular bowel pattern at home, your normal regimen is likely to be disrupted due to multiple reasons following surgery.  Combination of anesthesia, postoperative narcotics, change in appetite and fluid intake all can affect your bowels.   YOU MUST use at least one of the following options; they are listed in order of increasing strength to get the job done.  They are all available over the counter, and you may need to use some, POSSIBLY even all of these options:    Drink plenty of fluids (prune juice may be helpful) and high fiber foods Colace 100 mg by mouth twice a day  Senokot for constipation as directed and as needed Dulcolax (bisacodyl), take with full  glass of water  Miralax (polyethylene glycol) once or twice a day as needed.  If you have tried all these things and are unable to have a bowel movement in the first 3-4 days after surgery call either your surgeon or your primary doctor.    If you experience loose stools or diarrhea, hold the medications until you stool forms back up.  If your symptoms do not get better within 1 week or if they get worse, check with your doctor.  If you experience "the worst abdominal pain ever" or develop nausea or vomiting, please contact the office immediately for further recommendations for treatment.   ITCHING:  If you experience itching with your medications, try taking only a single pain pill, or even half a pain pill at a time.  You can also use Benadryl over the counter for itching or also to help with sleep.   TED HOSE STOCKINGS:  Use stockings on both legs until for at least 2 weeks or  as directed by physician office. They may be removed at night for sleeping.  MEDICATIONS:  See your medication summary on the "After Visit Summary" that nursing will review with you.  You may have some home medications which will be placed on hold until you complete the course of blood thinner medication.  It is important for you to complete the blood thinner medication as prescribed.  PRECAUTIONS:  If you experience chest pain or shortness of breath - call 911 immediately for transfer to the hospital emergency department.   If you develop a fever greater that 101 F, purulent drainage from wound, increased redness or drainage from wound, foul odor from the wound/dressing, or calf pain - CONTACT YOUR SURGEON.                                                   FOLLOW-UP APPOINTMENTS:  If you do not already have a post-op appointment, please call the office for an appointment to be seen by your surgeon.  Guidelines for how soon to be seen are listed in your "After Visit Summary", but are typically between 1-4 weeks after  surgery.  OTHER INSTRUCTIONS:   Knee Replacement:  Do not place pillow under knee, focus on keeping the knee straight while resting. CPM instructions: 0-90 degrees, 2 hours in the morning, 2 hours in the afternoon, and 2 hours in the evening. Place foam block, curve side up under heel at all times except when in CPM or when walking.  DO NOT modify, tear, cut, or change the foam block in any way.  MAKE SURE YOU:  Understand these instructions.  Get help right away if you are not doing well or get worse.    Thank you for letting us be a part of your medical care team.  It is a privilege we respect greatly.  We hope these instructions will help you stay on track for a fast and full recovery!   Do not put a pillow under the knee. Place it under the heel.   Complete by: As directed    Place gray foam block, curve side up under heel at all times except when in CPM or when walking.  DO NOT modify, tear, cut, or change in any way the gray foam block.   Increase activity slowly as tolerated   Complete by: As directed    Patient may shower   Complete by: As directed    Aquacel dressing is water proof    Wash over it and the whole leg with soap and water at the end of your shower   TED hose   Complete by: As directed    Use stockings (TED hose) for 2 weeks on both leg(s).  You may remove them at night for sleeping.      Follow-up Information    [facility] Follow up on 10/02/2019.   Specialty: Orthopedic Surgery Why: Outpatient physical therapy appointment with Vonna Kotyk at arrive at 7:45 for 8 am appointemnt Contact information: [facility] [address] [place] [address] [phone]        Salvatore Marvel, MD Follow up on 10/14/2019.   Specialty: Orthopedic Surgery Why: appointment to see Dr Thurston Hole at 2 pm Contact information: [address] [place] [address] [phone]  Signed: Pascal Lux 10/01/2019, 11:02 AM

## 2019-10-01 NOTE — Progress Notes (Addendum)
Therapy Plan: Prearranged [facility]   Update: Per PA, Self Home, No HHPT at this time. CSW updated [facility] Rep.

## 2019-10-01 NOTE — Progress Notes (Signed)
Physical Therapy Treatment Patient Details Name: Paul [name] MRN: [id number] DOB: 1951/04/10 Today's Date: 10/01/2019    History of Present Illness s/p R TKA. PMH: L TKA, Hep C    PT Comments    Pt is doing quite well, pain controlled. Pt is ready for d/c from PT standpoint  Follow Up Recommendations  Follow surgeon's recommendation for DC plan and follow-up therapies     Equipment Recommendations  None recommended by PT    Recommendations for Other Services       Precautions / Restrictions Precautions Precautions: Knee Restrictions Weight Bearing Restrictions: No Other Position/Activity Restrictions: WBAT    Mobility  Bed Mobility                  Transfers Overall transfer level: Needs assistance Equipment used: Rolling walker (2 wheeled) Transfers: Sit to/from Stand Sit to Stand: Supervision         General transfer comment: cues for hand placement   Ambulation/Gait Ambulation/Gait assistance: Supervision;Min guard Gait Distance (Feet): 150 Feet Assistive device: Rolling walker (2 wheeled) Gait Pattern/deviations: Step-to pattern;Step-through pattern     General Gait Details: cues for sequence and RW position   Stairs Stairs: Yes Stairs assistance: Min guard Stair Management: Two rails;Forwards Number of Stairs: 3 General stair comments: cues for sequence    Wheelchair Mobility    Modified Rankin (Stroke Patients Only)       Balance                                            Cognition Arousal/Alertness: Awake/alert Behavior During Therapy: WFL for tasks assessed/performed Overall Cognitive Status: Within Functional Limits for tasks assessed                                        Exercises Total Joint Exercises Ankle Circles/Pumps: AROM;10 reps;Both Quad Sets: AROM;Both;10 reps Heel Slides: AAROM;Right;10 reps;AROM Straight Leg Raises: AAROM;Right;10 reps Goniometric ROM: grossly 6 to 85  degrees right knee flexion    General Comments        Pertinent Vitals/Pain Pain Assessment: 0-10 Pain Score: 1  Pain Location: right knee and thigh Pain Descriptors / Indicators: Sore Pain Intervention(s): Limited activity within patient's tolerance;Monitored during session;Premedicated before session;Repositioned;Ice applied    Home Living                      Prior Function            PT Goals (current goals can now be found in the care plan section) Acute Rehab PT Goals PT Goal Formulation: With patient Time For Goal Achievement: 10/07/19 Potential to Achieve Goals: Good Progress towards PT goals: Progressing toward goals    Frequency    7X/week      PT Plan Current plan remains appropriate    Co-evaluation              AM-PAC PT "6 Clicks" Mobility   Outcome Measure  Help needed turning from your back to your side while in a flat bed without using bedrails?: A Little Help needed moving from lying on your back to sitting on the side of a flat bed without using bedrails?: A Little Help needed moving to and from a bed to a chair (including a  wheelchair)?: A Little Help needed standing up from a chair using your arms (e.g., wheelchair or bedside chair)?: A Little Help needed to walk in hospital room?: A Little Help needed climbing 3-5 steps with a railing? : A Little 6 Click Score: 18    End of Session Equipment Utilized During Treatment: Gait belt Activity Tolerance: Patient tolerated treatment well Patient left: in chair;with call bell/phone within reach;with chair alarm set   PT Visit Diagnosis: Difficulty in walking, not elsewhere classified (R26.2)     Time: 0272-5366 PT Time Calculation (min) (ACUTE ONLY): 26 min  Charges:  $Gait Training: 8-22 mins $Therapeutic Exercise: 8-22 mins                     Drucilla Chalet, PT  Pager: [phone] Acute Rehab Dept [facility]): [phone]   10/01/2019    [facility] 10/01/2019, 9:50  AM

## 2019-10-03 ENCOUNTER — Other Ambulatory Visit: Payer: Self-pay

## 2019-10-03 ENCOUNTER — Other Ambulatory Visit: Payer: Commercial Managed Care - PPO | Admitting: Internal Medicine

## 2019-10-03 DIAGNOSIS — E785 Hyperlipidemia, unspecified: Secondary | ICD-10-CM

## 2019-10-03 DIAGNOSIS — Z125 Encounter for screening for malignant neoplasm of prostate: Secondary | ICD-10-CM

## 2019-10-03 DIAGNOSIS — Z0000 Encounter for general adult medical examination without abnormal findings: Secondary | ICD-10-CM

## 2019-10-03 DIAGNOSIS — Z8619 Personal history of other infectious and parasitic diseases: Secondary | ICD-10-CM

## 2019-10-03 NOTE — Addendum Note (Signed)
Addended by: Mady Haagensen on: 10/03/2019 10:44 AM   Modules accepted: Orders

## 2019-10-04 LAB — LIPID PANEL
Cholesterol: 200 mg/dL — ABNORMAL HIGH (ref <200)
HDL: 63 mg/dL (ref >=40)
LDL Cholesterol (Calc): 112 mg/dL (calc) — ABNORMAL HIGH
Non-HDL Cholesterol (Calc): 137 mg/dL (calc) — ABNORMAL HIGH (ref <130)
Total CHOL/HDL Ratio: 3.2 (calc) (ref <5.0)
Triglycerides: 139 mg/dL (ref <150)

## 2019-10-04 LAB — PSA: PSA: 1.3 ng/mL (ref <=4.0)

## 2019-10-07 ENCOUNTER — Encounter: Payer: Self-pay | Admitting: Internal Medicine

## 2019-10-07 ENCOUNTER — Ambulatory Visit (INDEPENDENT_AMBULATORY_CARE_PROVIDER_SITE_OTHER): Payer: Commercial Managed Care - PPO | Admitting: Internal Medicine

## 2019-10-07 ENCOUNTER — Other Ambulatory Visit: Payer: Self-pay

## 2019-10-07 VITALS — BP 120/70 | HR 95 | Temp 98.2°F | Ht 70.0 in | Wt 178.0 lb

## 2019-10-07 DIAGNOSIS — Z8619 Personal history of other infectious and parasitic diseases: Secondary | ICD-10-CM | POA: Diagnosis not present

## 2019-10-07 DIAGNOSIS — Z96651 Presence of right artificial knee joint: Secondary | ICD-10-CM

## 2019-10-07 DIAGNOSIS — Z96652 Presence of left artificial knee joint: Secondary | ICD-10-CM | POA: Diagnosis not present

## 2019-10-07 DIAGNOSIS — Z8659 Personal history of other mental and behavioral disorders: Secondary | ICD-10-CM | POA: Diagnosis not present

## 2019-10-07 DIAGNOSIS — A048 Other specified bacterial intestinal infections: Secondary | ICD-10-CM | POA: Diagnosis not present

## 2019-10-07 DIAGNOSIS — Z0000 Encounter for general adult medical examination without abnormal findings: Secondary | ICD-10-CM | POA: Diagnosis not present

## 2019-10-07 LAB — POCT URINALYSIS DIPSTICK
Appearance: NEGATIVE
Bilirubin, UA: NEGATIVE
Blood, UA: NEGATIVE
Glucose, UA: NEGATIVE
Ketones, UA: NEGATIVE
Leukocytes, UA: NEGATIVE
Nitrite, UA: NEGATIVE
Odor: NEGATIVE
Protein, UA: NEGATIVE
Spec Grav, UA: 1.01 (ref 1.010–1.025)
Urobilinogen, UA: 0.2 E.U./dL
pH, UA: 6.5 (ref 5.0–8.0)

## 2019-10-07 NOTE — Patient Instructions (Signed)
Labs are excellent. Defer Prevnar 13 til feeling better. Has had flu vaccine at work. Return soon for repeat H.pylori testing when phlebotomist is here to administer test.
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
## 2019-11-10 ENCOUNTER — Encounter (HOSPITAL_COMMUNITY): Payer: Self-pay | Admitting: Internal Medicine

## 2019-11-10 ENCOUNTER — Emergency Department (HOSPITAL_COMMUNITY): Payer: Commercial Managed Care - PPO

## 2019-11-10 ENCOUNTER — Inpatient Hospital Stay (HOSPITAL_COMMUNITY)
Admission: EM | Admit: 2019-11-10 | Discharge: 2019-11-14 | DRG: 872 | Disposition: A | Discharge status: Home / Self Care | Payer: Commercial Managed Care - PPO | Attending: Internal Medicine | Admitting: Internal Medicine

## 2019-11-10 ENCOUNTER — Other Ambulatory Visit: Payer: Self-pay

## 2019-11-10 DIAGNOSIS — Z8619 Personal history of other infectious and parasitic diseases: Secondary | ICD-10-CM

## 2019-11-10 DIAGNOSIS — M545 Low back pain: Secondary | ICD-10-CM | POA: Diagnosis present

## 2019-11-10 DIAGNOSIS — A419 Sepsis, unspecified organism: Principal | ICD-10-CM | POA: Diagnosis present

## 2019-11-10 DIAGNOSIS — Z7982 Long term (current) use of aspirin: Secondary | ICD-10-CM | POA: Diagnosis not present

## 2019-11-10 DIAGNOSIS — Z20828 Contact with and (suspected) exposure to other viral communicable diseases: Secondary | ICD-10-CM | POA: Diagnosis present

## 2019-11-10 DIAGNOSIS — Z79899 Other long term (current) drug therapy: Secondary | ICD-10-CM

## 2019-11-10 DIAGNOSIS — K5780 Diverticulitis of intestine, part unspecified, with perforation and abscess without bleeding: Secondary | ICD-10-CM | POA: Diagnosis present

## 2019-11-10 DIAGNOSIS — K5720 Diverticulitis of large intestine with perforation and abscess without bleeding: Secondary | ICD-10-CM | POA: Diagnosis present

## 2019-11-10 DIAGNOSIS — K5792 Diverticulitis of intestine, part unspecified, without perforation or abscess without bleeding: Secondary | ICD-10-CM | POA: Diagnosis not present

## 2019-11-10 DIAGNOSIS — F1010 Alcohol abuse, uncomplicated: Secondary | ICD-10-CM

## 2019-11-10 DIAGNOSIS — Z96653 Presence of artificial knee joint, bilateral: Secondary | ICD-10-CM | POA: Diagnosis present

## 2019-11-10 DIAGNOSIS — R739 Hyperglycemia, unspecified: Secondary | ICD-10-CM | POA: Diagnosis present

## 2019-11-10 DIAGNOSIS — F1210 Cannabis abuse, uncomplicated: Secondary | ICD-10-CM | POA: Diagnosis present

## 2019-11-10 DIAGNOSIS — R1030 Lower abdominal pain, unspecified: Secondary | ICD-10-CM | POA: Diagnosis not present

## 2019-11-10 DIAGNOSIS — F418 Other specified anxiety disorders: Secondary | ICD-10-CM | POA: Diagnosis present

## 2019-11-10 LAB — URINALYSIS, ROUTINE W REFLEX MICROSCOPIC
Bacteria, UA: NONE SEEN
Bilirubin Urine: NEGATIVE
Glucose, UA: NEGATIVE mg/dL
Hgb urine dipstick: NEGATIVE
Ketones, ur: NEGATIVE mg/dL
Leukocytes,Ua: NEGATIVE
Nitrite: NEGATIVE
Protein, ur: 100 mg/dL — AB
Specific Gravity, Urine: 1.032 — ABNORMAL HIGH (ref 1.005–1.030)
pH: 6 (ref 5.0–8.0)

## 2019-11-10 LAB — PROTIME-INR
INR: 1.1 (ref 0.8–1.2)
Prothrombin Time: 14.4 seconds (ref 11.4–15.2)

## 2019-11-10 LAB — CBC WITH DIFFERENTIAL/PLATELET
Abs Immature Granulocytes: 0.06 10*3/uL (ref 0.00–0.07)
Basophils Absolute: 0 10*3/uL (ref 0.0–0.1)
Basophils Relative: 0 %
Eosinophils Absolute: 0 10*3/uL (ref 0.0–0.5)
Eosinophils Relative: 0 %
HCT: 44.2 % (ref 39.0–52.0)
Hemoglobin: 15.3 g/dL (ref 13.0–17.0)
Immature Granulocytes: 0 %
Lymphocytes Relative: 3 %
Lymphs Abs: 0.5 10*3/uL — ABNORMAL LOW (ref 0.7–4.0)
MCH: 30.8 pg (ref 26.0–34.0)
MCHC: 34.6 g/dL (ref 30.0–36.0)
MCV: 88.9 fL (ref 80.0–100.0)
Monocytes Absolute: 0.9 10*3/uL (ref 0.1–1.0)
Monocytes Relative: 5 %
Neutro Abs: 16.4 10*3/uL — ABNORMAL HIGH (ref 1.7–7.7)
Neutrophils Relative %: 92 %
Platelets: 244 10*3/uL (ref 150–400)
RBC: 4.97 MIL/uL (ref 4.22–5.81)
RDW: 13.1 % (ref 11.5–15.5)
WBC: 17.8 10*3/uL — ABNORMAL HIGH (ref 4.0–10.5)
nRBC: 0 % (ref 0.0–0.2)

## 2019-11-10 LAB — COMPREHENSIVE METABOLIC PANEL
ALT: 13 U/L (ref 0–44)
AST: 13 U/L — ABNORMAL LOW (ref 15–41)
Albumin: 3.5 g/dL (ref 3.5–5.0)
Alkaline Phosphatase: 68 U/L (ref 38–126)
Anion gap: 12 (ref 5–15)
BUN: 19 mg/dL (ref 8–23)
CO2: 23 mmol/L (ref 22–32)
Calcium: 9.2 mg/dL (ref 8.9–10.3)
Chloride: 98 mmol/L (ref 98–111)
Creatinine, Ser: 0.99 mg/dL (ref 0.61–1.24)
GFR calc Af Amer: >60 mL/min (ref >60)
GFR calc non Af Amer: >60 mL/min (ref >60)
Glucose, Bld: 121 mg/dL — ABNORMAL HIGH (ref 70–99)
Potassium: 3.6 mmol/L (ref 3.5–5.1)
Sodium: 133 mmol/L — ABNORMAL LOW (ref 135–145)
Total Bilirubin: 1.2 mg/dL (ref 0.3–1.2)
Total Protein: 6.9 g/dL (ref 6.5–8.1)

## 2019-11-10 LAB — RAPID URINE DRUG SCREEN, HOSP PERFORMED
Amphetamines: NOT DETECTED
Barbiturates: NOT DETECTED
Benzodiazepines: POSITIVE — AB
Cocaine: NOT DETECTED
Opiates: NOT DETECTED
Tetrahydrocannabinol: POSITIVE — AB

## 2019-11-10 LAB — POC SARS CORONAVIRUS 2 AG -  ED: SARS Coronavirus 2 Ag: NEGATIVE

## 2019-11-10 LAB — APTT: aPTT: 35 seconds (ref 24–36)

## 2019-11-10 LAB — LACTIC ACID, PLASMA
Lactic Acid, Venous: 1.7 mmol/L (ref 0.5–1.9)
Lactic Acid, Venous: 1.6 mmol/L (ref 0.5–1.9)

## 2019-11-10 LAB — SARS CORONAVIRUS 2 (TAT 6-24 HRS): SARS Coronavirus 2: NEGATIVE

## 2019-11-10 LAB — HIV ANTIBODY (ROUTINE TESTING W REFLEX): HIV Screen 4th Generation wRfx: NONREACTIVE

## 2019-11-10 MED ORDER — THIAMINE HCL 100 MG PO TABS
100.0000 mg | ORAL_TABLET | Freq: Every day | ORAL | Status: DC
  Administered 2019-11-10 – 2019-11-14 (×5): 100 mg via ORAL
  Filled 2019-11-10 (×5): qty 1

## 2019-11-10 MED ORDER — IOHEXOL 300 MG/ML  SOLN
100.0000 mL | Freq: Once | INTRAMUSCULAR | Status: AC | PRN
  Administered 2019-11-10: 100 mL via INTRAVENOUS

## 2019-11-10 MED ORDER — MORPHINE SULFATE (PF) 4 MG/ML IV SOLN
4.0000 mg | Freq: Once | INTRAVENOUS | Status: AC
  Administered 2019-11-10: 14:00:00 4 mg via INTRAVENOUS
  Filled 2019-11-10: qty 1

## 2019-11-10 MED ORDER — PIPERACILLIN-TAZOBACTAM 3.375 G IVPB 30 MIN
3.3750 g | Freq: Once | INTRAVENOUS | Status: AC
  Administered 2019-11-10: 16:00:00 3.375 g via INTRAVENOUS
  Filled 2019-11-10: qty 50

## 2019-11-10 MED ORDER — ONDANSETRON 4 MG PO TBDP: 4.0000 mg | ORAL_TABLET | Freq: Four times a day (QID) | ORAL | Status: DC | PRN

## 2019-11-10 MED ORDER — THIAMINE HCL 100 MG/ML IJ SOLN: 100.0000 mg | Freq: Every day | INTRAMUSCULAR | Status: DC

## 2019-11-10 MED ORDER — LORAZEPAM 2 MG/ML IJ SOLN: 1.0000 mg | INTRAMUSCULAR | Status: AC | PRN

## 2019-11-10 MED ORDER — FOLIC ACID 1 MG PO TABS
1.0000 mg | ORAL_TABLET | Freq: Every day | ORAL | Status: DC
  Administered 2019-11-10 – 2019-11-14 (×5): 1 mg via ORAL
  Filled 2019-11-10 (×5): qty 1

## 2019-11-10 MED ORDER — MORPHINE SULFATE (PF) 2 MG/ML IV SOLN
2.0000 mg | INTRAVENOUS | Status: DC | PRN
  Administered 2019-11-10 – 2019-11-14 (×15): 2 mg via INTRAVENOUS
  Filled 2019-11-10 (×14): qty 1

## 2019-11-10 MED ORDER — SODIUM CHLORIDE 0.9 % IV BOLUS
1000.0000 mL | Freq: Once | INTRAVENOUS | Status: AC
  Administered 2019-11-10: 13:00:00 1000 mL via INTRAVENOUS

## 2019-11-10 MED ORDER — DULOXETINE HCL 60 MG PO CPEP
60.0000 mg | ORAL_CAPSULE | Freq: Every day | ORAL | Status: DC
  Administered 2019-11-10 – 2019-11-14 (×5): 60 mg via ORAL
  Filled 2019-11-10 (×6): qty 1

## 2019-11-10 MED ORDER — ACETAMINOPHEN 650 MG RE SUPP: 650.0000 mg | Freq: Four times a day (QID) | RECTAL | Status: DC | PRN

## 2019-11-10 MED ORDER — ACETAMINOPHEN 325 MG PO TABS
650.0000 mg | ORAL_TABLET | Freq: Four times a day (QID) | ORAL | Status: DC | PRN
  Administered 2019-11-10 – 2019-11-14 (×6): 650 mg via ORAL
  Filled 2019-11-10 (×6): qty 2

## 2019-11-10 MED ORDER — LACTATED RINGERS IV SOLN: INTRAVENOUS | Status: DC

## 2019-11-10 MED ORDER — PIPERACILLIN-TAZOBACTAM 3.375 G IVPB
3.3750 g | Freq: Three times a day (TID) | INTRAVENOUS | Status: DC
  Administered 2019-11-10 – 2019-11-13 (×8): 3.375 g via INTRAVENOUS
  Filled 2019-11-10 (×7): qty 50

## 2019-11-10 MED ORDER — ADULT MULTIVITAMIN W/MINERALS CH
1.0000 | ORAL_TABLET | Freq: Every day | ORAL | Status: DC
  Administered 2019-11-10 – 2019-11-14 (×5): 1 via ORAL
  Filled 2019-11-10 (×5): qty 1

## 2019-11-10 MED ORDER — ONDANSETRON HCL 4 MG/2ML IJ SOLN
4.0000 mg | Freq: Four times a day (QID) | INTRAMUSCULAR | Status: DC | PRN
  Administered 2019-11-10: 4 mg via INTRAVENOUS
  Filled 2019-11-10: qty 2

## 2019-11-10 MED ORDER — LORAZEPAM 1 MG PO TABS
1.0000 mg | ORAL_TABLET | ORAL | Status: AC | PRN
  Administered 2019-11-10 – 2019-11-12 (×3): 1 mg via ORAL
  Filled 2019-11-10: qty 1
  Filled 2019-11-10: qty 2
  Filled 2019-11-10: qty 1

## 2019-11-10 NOTE — ED Notes (Signed)
Attempted report 

## 2019-11-10 NOTE — Consult Note (Signed)
Reason for Consult: Diverticulitis Referring Physician: Lorin Mercy MD  Paul [name] is an 68 y.o. male.  HPI: Asked see patient at the request of Dr. Inda Merlin for abdominal pain.  Patient relates a 1 day history of left lower quadrant abdominal pain that is worsened throughout the night.  He has never had this pain before.  The pain is sharp in nature location is left lower quadrant of abdomen made worse with movement or compression.  CT scan was obtained which showed acute diverticulitis with microperforation without abscess.  He denies fever or chills.  This is his first bout of this kind of pain he states.  Bowel function overall is normal  Past Medical History:  Diagnosis Date  . Anxiety   . Depression   . Hepatitis    Hep C treated 12 years ago viral load negative after a year of treatment   . Primary localized osteoarthritis of right knee 09/19/2019  . S/P TKR (total knee replacement), left   11/12/2018 09/19/2019    Past Surgical History:  Procedure Laterality Date  . REPLACEMENT TOTAL KNEE Left 11/12/2018  . shouler arthroscopy     bilateral   . TOTAL KNEE ARTHROPLASTY Right 09/30/2019   Procedure: TOTAL KNEE ARTHROPLASTY;  Surgeon: Elsie Saas, MD;  Location: [facility];  Service: Orthopedics;  Laterality: Right;    Family History  Adopted: Yes  Family history unknown: Yes    Social History:  reports that he has never smoked. He has never used smokeless tobacco. He reports current alcohol use. He reports current drug use. Drug: Marijuana.  Allergies: No Known Allergies  Medications: I have reviewed the patient's current medications.  Results for orders placed or performed during the hospital encounter of 11/10/19 (from the past 48 hour(s))  Lactic acid, plasma     Status: None   Collection Time: 11/10/19 12:49 PM  Result Value Ref Range   Lactic Acid, Venous 1.7 0.5 - 1.9 mmol/L    Comment: Performed at [facility], [place] [address], [place], [place] [address]   Comprehensive metabolic panel     Status: Abnormal   Collection Time: 11/10/19 12:49 PM  Result Value Ref Range   Sodium 133 (L) 135 - 145 mmol/L   Potassium 3.6 3.5 - 5.1 mmol/L   Chloride 98 98 - 111 mmol/L   CO2 23 22 - 32 mmol/L   Glucose, Bld 121 (H) 70 - 99 mg/dL   BUN 19 8 - 23 mg/dL   Creatinine, Ser 0.99 0.61 - 1.24 mg/dL   Calcium 9.2 8.9 - 10.3 mg/dL   Total Protein 6.9 6.5 - 8.1 g/dL   Albumin 3.5 3.5 - 5.0 g/dL   AST 13 (L) 15 - 41 U/L   ALT 13 0 - 44 U/L   Alkaline Phosphatase 68 38 - 126 U/L   Total Bilirubin 1.2 0.3 - 1.2 mg/dL   GFR calc non Af Amer >60 >60 mL/min   GFR calc Af Amer >60 >60 mL/min   Anion gap 12 5 - 15    Comment: Performed at [facility] [address] [place], [place] [address]  CBC WITH DIFFERENTIAL     Status: Abnormal   Collection Time: 11/10/19 12:49 PM  Result Value Ref Range   WBC 17.8 (H) 4.0 - 10.5 K/uL   RBC 4.97 4.22 - 5.81 MIL/uL   Hemoglobin 15.3 13.0 - 17.0 g/dL   HCT 44.2 39.0 - 52.0 %   MCV 88.9 80.0 -  100.0 fL   MCH 30.8 26.0 - 34.0 pg   MCHC 34.6 30.0 - 36.0 g/dL   RDW 13.1 11.5 - 15.5 %   Platelets 244 150 - 400 K/uL   nRBC 0.0 0.0 - 0.2 %   Neutrophils Relative % 92 %   Neutro Abs 16.4 (H) 1.7 - 7.7 K/uL   Lymphocytes Relative 3 %   Lymphs Abs 0.5 (L) 0.7 - 4.0 K/uL   Monocytes Relative 5 %   Monocytes Absolute 0.9 0.1 - 1.0 K/uL   Eosinophils Relative 0 %   Eosinophils Absolute 0.0 0.0 - 0.5 K/uL   Basophils Relative 0 %   Basophils Absolute 0.0 0.0 - 0.1 K/uL   Immature Granulocytes 0 %   Abs Immature Granulocytes 0.06 0.00 - 0.07 K/uL    Comment: Performed at [facility] [address], [place], [place] [address]  APTT     Status: None   Collection Time: 11/10/19 12:49 PM  Result Value Ref Range   aPTT 35 24 - 36 seconds    Comment: Performed at [facility] [address] [place], [place] [address]  Protime-INR     Status: None   Collection Time: 11/10/19 12:49 PM  Result Value  Ref Range   Prothrombin Time 14.4 11.4 - 15.2 seconds   INR 1.1 0.8 - 1.2    Comment: (NOTE) INR goal varies based on device and disease states. Performed at [facility], [place] [address], [place], [place] [address]   POC SARS Coronavirus 2 Ag-ED - Nasal Swab (BD Veritor Kit)     Status: None   Collection Time: 11/10/19  1:26 PM  Result Value Ref Range   SARS Coronavirus 2 Ag NEGATIVE NEGATIVE    Comment: (NOTE) SARS-CoV-2 antigen NOT DETECTED.  Negative results are presumptive.  Negative results do not preclude SARS-CoV-2 infection and should not be used as the sole basis for treatment or other patient management decisions, including infection  control decisions, particularly in the presence of clinical signs and  symptoms consistent with COVID-19, or in those who have been in contact with the virus.  Negative results must be combined with clinical observations, patient history, and epidemiological information. The expected result is Negative. Fact Sheet for Patients: PodPark.tn Fact Sheet for Healthcare Providers: GiftContent.is This test is not yet approved or cleared by the [place] FDA and  has been authorized for detection and/or diagnosis of SARS-CoV-2 by FDA under an Emergency Use Authorization (EUA).  This EUA will remain in effect (meaning this test can be used) for the duration of  the COVID-19 de claration under Section 564(b)(1) of the Act, 21 U.S.C. section 360bbb-3(b)(1), unless the authorization is terminated or revoked sooner.   Urinalysis, Routine w reflex microscopic     Status: Abnormal   Collection Time: 11/10/19  2:06 PM  Result Value Ref Range   Color, Urine AMBER (A) YELLOW    Comment: BIOCHEMICALS MAY BE AFFECTED BY COLOR   APPearance CLEAR CLEAR   Specific Gravity, Urine 1.032 (H) 1.005 - 1.030   pH 6.0 5.0 - 8.0   Glucose, UA NEGATIVE NEGATIVE mg/dL   Hgb urine dipstick NEGATIVE  NEGATIVE   Bilirubin Urine NEGATIVE NEGATIVE   Ketones, ur NEGATIVE NEGATIVE mg/dL   Protein, ur 100 (A) NEGATIVE mg/dL   Nitrite NEGATIVE NEGATIVE   Leukocytes,Ua NEGATIVE NEGATIVE   RBC / HPF 0-5 0 - 5 RBC/hpf   WBC, UA 11-20 0 - 5  WBC/hpf   Bacteria, UA NONE SEEN NONE SEEN   Mucus PRESENT    Non Squamous Epithelial 0-5 (A) NONE SEEN    Comment: Performed at [facility], [place] [address], [place], [place] [address]  Lactic acid, plasma     Status: None   Collection Time: 11/10/19  3:47 PM  Result Value Ref Range   Lactic Acid, Venous 1.6 0.5 - 1.9 mmol/L    Comment: Performed at [facility] [address] [place], [place] [address]    CT ABDOMEN PELVIS W CONTRAST  Result Date: 11/10/2019 CLINICAL DATA:  Lower abdominal pain, fever, nausea, and diarrhea beginning last night. EXAM: CT ABDOMEN AND PELVIS WITH CONTRAST TECHNIQUE: Multidetector CT imaging of the abdomen and pelvis was performed using the standard protocol following bolus administration of intravenous contrast. CONTRAST:  165m OMNIPAQUE IOHEXOL 300 MG/ML  SOLN COMPARISON:  None. FINDINGS: Lower Chest: No acute findings. Hepatobiliary: No hepatic masses identified. Gallbladder is unremarkable. No evidence of biliary ductal dilatation. Pancreas:  No mass or inflammatory changes. Spleen: Within normal limits in size and appearance. Adrenals/Urinary Tract: No masses identified. No evidence of hydronephrosis. Stomach/Bowel: Mild sigmoid diverticulitis is seen with a few tiny extraluminal air bubbles consistent with micro perforation. No evidence of free intraperitoneal air. No evidence of abscess or ascites. Tiny hiatal hernia noted. Vascular/Lymphatic: No pathologically enlarged lymph nodes. No abdominal aortic aneurysm. Aortic atherosclerosis incidentally noted. Reproductive:  No mass or other significant abnormality. Other:  None. Musculoskeletal:  No suspicious bone lesions identified. IMPRESSION: Mild sigmoid  diverticulitis, with microperforation. No evidence of free intraperitoneal air or abscess. Aortic Atherosclerosis (ICD10-I70.0). Electronically Signed   By: JMarlaine HindM.D.   On: 11/10/2019 15:05   DG Chest Port 1 View  Result Date: 11/10/2019 CLINICAL DATA:  Fever. EXAM: PORTABLE CHEST 1 VIEW COMPARISON:  None. FINDINGS: The heart size and mediastinal contours are within normal limits. Aortic atherosclerosis incidentally noted. Both lungs are clear. The visualized skeletal structures are unremarkable. IMPRESSION: No active disease. Electronically Signed   By: JMarlaine HindM.D.   On: 11/10/2019 13:05    Review of Systems  Constitutional: Positive for fatigue.  Gastrointestinal: Positive for abdominal pain.  All other systems reviewed and are negative.  Blood pressure (!) 145/85, pulse 73, temperature 98.9 F (37.2 C), temperature source Oral, resp. rate (!) 24, SpO2 100 %. Physical Exam  Constitutional: He is oriented to person, place, and time. He appears well-developed and well-nourished.  HENT:  Head: Normocephalic.  Eyes: Pupils are equal, round, and reactive to light.  Cardiovascular: Normal rate and regular rhythm.  Respiratory: Effort normal. No respiratory distress.  GI: There is abdominal tenderness in the left lower quadrant. There is guarding. There is no rigidity.  Musculoskeletal:        General: Normal range of motion.     Cervical back: Normal range of motion.  Neurological: He is alert and oriented to person, place, and time.  Skin: Skin is warm and dry.  Psychiatric: He has a normal mood and affect. His behavior is normal. Judgment and thought content normal.    Assessment/Plan: Acute diverticulitis with microperforation without signs of systemic illness  Agree with IV antibiotics and bowel rest for now.  No acute surgical indication at this point time but we will follow while he is hospitalized.  Paul [name] A Izaih Kataoka 11/10/2019, 5:21 PM

## 2019-11-10 NOTE — ED Provider Notes (Signed)
[facility] EMERGENCY DEPARTMENT Provider Note   CSN: [id number] Arrival date & time: 11/10/19  1204     History Chief Complaint  Patient presents with  . Abdominal Pain    Paul [name] is a 68 y.o. male.  HPI 68 year old male history of anxiety, depression, hep C presents today with 1 day history of abdominal pain and fever.  He began having some abdominal pain yesterday diffusely through his lower abdomen.  He has had several loose stools.  He has had fever up to 101.  No headache, sore throat, nasal congestion, cough, or vomiting although he has had some nausea.  He has been taking Tylenol at home for fever.  He does work in          Past Medical History:  Diagnosis Date  . Anxiety   . Depression   . Hepatitis    Hep C treated 12 years ago viral load negative after a year of treatment   . Primary localized osteoarthritis of right knee 09/19/2019  . S/P TKR (total knee replacement), left   11/12/2018 09/19/2019    Patient Active Problem List   Diagnosis Date Noted  . Primary localized osteoarthritis of right knee 09/19/2019  . S/P TKR (total knee replacement), left   11/12/2018 09/19/2019  . History of hepatitis C 12/24/2012  . Depression 03/19/2012    Past Surgical History:  Procedure Laterality Date  . REPLACEMENT TOTAL KNEE Left 11/12/2018  . shouler arthroscopy     bilateral   . TOTAL KNEE ARTHROPLASTY Right 09/30/2019   Procedure: TOTAL KNEE ARTHROPLASTY;  Surgeon: Salvatore Marvel, MD;  Location: [facility];  Service: Orthopedics;  Laterality: Right;       Family History  Adopted: Yes  Family history unknown: Yes    Social History   Tobacco Use  . Smoking status: Never Smoker  . Smokeless tobacco: Never Used  Substance Use Topics  . Alcohol use: Yes    Comment: daily 2 beers   . Drug use: Never    Home Medications Prior to Admission medications   Medication Sig Start Date End Date Taking? Authorizing Provider  ALPRAZolam  Prudy Feeler) 0.5 MG tablet Take 1 tablet (0.5 mg total) by mouth 2 (two) times daily as needed for anxiety. 08/08/19   Margaree Mackintosh, MD  aspirin EC 325 MG EC tablet Take 1 tablet (325 mg total) by mouth daily with breakfast. 10/02/19   Shepperson, Kirstin, PA-C  DULoxetine (CYMBALTA) 60 MG capsule TAKE 1 CAPSULE BY MOUTH EVERY DAY 10/01/19   Margaree Mackintosh, MD  gabapentin (NEURONTIN) 300 MG capsule 1 po qhs at bedtime for nerve pain 10/01/19   Shepperson, Kirstin, PA-C  oxyCODONE (OXY IR/ROXICODONE) 5 MG immediate release tablet 1 po q 4 hrs prn pain.  Patient had a total knee replacement surgery on 09/30/2019 10/01/19   Julien Girt, PA-C    Allergies    Patient has no known allergies.  Review of Systems   Review of Systems  Physical Exam Updated Vital Signs BP 127/84   Pulse 90   Temp 98.9 F (37.2 C) (Oral)   Resp 16   SpO2 100%   Physical Exam  ED Results / Procedures / Treatments   Labs (all labs ordered are listed, but only abnormal results are displayed) Labs Reviewed  CULTURE, BLOOD (ROUTINE X 2)  CULTURE, BLOOD (ROUTINE X 2)  URINE CULTURE  LACTIC ACID, PLASMA  LACTIC ACID, PLASMA  COMPREHENSIVE METABOLIC PANEL  CBC WITH  DIFFERENTIAL/PLATELET  APTT  PROTIME-INR  URINALYSIS, ROUTINE W REFLEX MICROSCOPIC  POC SARS CORONAVIRUS 2 AG -  ED    EKG EKG Interpretation  Date/Time:  Sunday November 10 2019 12:31:46 EST Ventricular Rate:  77 PR Interval:    QRS Duration: 85 QT Interval:  356 QTC Calculation: 403 R Axis:   -3 Text Interpretation: Sinus rhythm Baseline wander in lead(s) V4 Confirmed by Gladyse Corvin ([id number]) on 11/10/2019 1:14:42 PM   Radiology CT ABDOMEN PELVIS W CONTRAST  Result Date: 11/10/2019 CLINICAL DATA:  Lower abdominal pain, fever, nausea, and diarrhea beginning last night. EXAM: CT ABDOMEN AND PELVIS WITH CONTRAST TECHNIQUE: Multidetector CT imaging of the abdomen and pelvis was performed using the standard protocol following bolus  administration of intravenous contrast. CONTRAST:  100mL OMNIPAQUE IOHEXOL 300 MG/ML  SOLN COMPARISON:  None. FINDINGS: Lower Chest: No acute findings. Hepatobiliary: No hepatic masses identified. Gallbladder is unremarkable. No evidence of biliary ductal dilatation. Pancreas:  No mass or inflammatory changes. Spleen: Within normal limits in size and appearance. Adrenals/Urinary Tract: No masses identified. No evidence of hydronephrosis. Stomach/Bowel: Mild sigmoid diverticulitis is seen with a few tiny extraluminal air bubbles consistent with micro perforation. No evidence of free intraperitoneal air. No evidence of abscess or ascites. Tiny hiatal hernia noted. Vascular/Lymphatic: No pathologically enlarged lymph nodes. No abdominal aortic aneurysm. Aortic atherosclerosis incidentally noted. Reproductive:  No mass or other significant abnormality. Other:  None. Musculoskeletal:  No suspicious bone lesions identified. IMPRESSION: Mild sigmoid diverticulitis, with microperforation. No evidence of free intraperitoneal air or abscess. Aortic Atherosclerosis (ICD10-I70.0). Electronically Signed   By: John A Stahl M.D.   On: 11/10/2019 15:05   DG Chest Port 1 View  Result Date: 11/10/2019 CLINICAL DATA:  Fever. EXAM: PORTABLE CHEST 1 VIEW COMPARISON:  None. FINDINGS: The heart size and mediastinal contours are within normal limits. Aortic atherosclerosis incidentally noted. Both lungs are clear. The visualized skeletal structures are unremarkable. IMPRESSION: No active disease. Electronically Signed   By: John A Stahl M.D.   On: 11/10/2019 13:05    Procedures Procedures (including critical care time)  Medications Ordered in ED Medications  sodium chloride 0.9 % bolus 1,000 mL (1,000 mLs Intravenous New Bag/Given 11/10/19 1252)    ED Course  I have reviewed the triage vital signs and the nursing notes.  Pertinent labs & imaging results that were available during my care of the patient were reviewed by  me and considered in my medical decision making (see chart for details).    MDM Rules/Calculators/A&P                     68  year old male presents today with abdominal pain and tenderness.  Evaluation here is significant for diverticulitis with microperforation.  White blood cell count elevated at 17,800 otherwise he is hemodynamically stable and electrolytes are grossly normal.  And IV fluids and antibiotics.  He will be admitted for ongoing evaluation and treatment.  Consult has been placed to hospitalist and general surgery. 3:45 PM Discussed with Dr. Lorin Mercy- she will see for admission Discussed with Dr. Brantley Stage and will see in consultation  Final Clinical Impression(s) / ED Diagnoses Final diagnoses:  Diverticulitis    Rx / DC Orders ED Discharge Orders    None       Pattricia Boss, MD 11/10/19 1642

## 2019-11-10 NOTE — ED Notes (Signed)
Dr. Lorin Mercy in to see pt at this time

## 2019-11-10 NOTE — ED Triage Notes (Signed)
Pt presents to the ED with lower abdominal pain, generalized fatigue, nausea, diarrhea, and reports a temperature of 102 last night. Pt denying any shortness of breath.   Reports taking 1000 mg of tylenol at 1000. 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

## 2019-11-10 NOTE — ED Notes (Signed)
This RN called CT about status of patient's scan. CT reports they will be ready for him soon.

## 2019-11-10 NOTE — H&P (Signed)
History and Physical    [name] ZOX:[id number] DOB: Mar 02, 1951 DOA: 11/10/2019  PCP: Paul Mackintosh, MD Consultants:  Paul [name] - orthopedics Patient coming from:  Home - lives alone; NOK: Daughter, Paul [name], [phone]  Chief Complaint: abdominal pain  HPI: Paul [name] is a 68 y.o. male with medical history significant of depression/anxiety and remote Hep C with recent (10/29) L TKR presenting with abdominal pain.  He reports waking up yesterday AM with pain in his lower abdomen.  He developed fever to 102.2 and had worsening pain.  He took Tylenol.  No SOB, cough.  Mild nausea without vomiting.  Last BM was this AM with diarrhea x 3 today.  Knee replacement in Oct, maybe 1 dose of antibiotics.  He just finished a course of prednisone for sciatica.     ED Course:  Nurse presenting with abd pain, fever, chills, diarrhea.  CT with diverticulitis with microperf.  Leukocytosis.  Hemodynamically stable.  Giving IVF and Zosyn, will notify surgery.  Review of Systems: As per HPI; otherwise review of systems reviewed and negative.   Ambulatory Status:  Ambulates without assistance  Past Medical History:  Diagnosis Date  . Anxiety   . Depression   . Hepatitis    Hep C treated 12 years ago viral load negative after a year of treatment   . Primary localized osteoarthritis of right knee 09/19/2019  . S/P TKR (total knee replacement), left   11/12/2018 09/19/2019    Past Surgical History:  Procedure Laterality Date  . REPLACEMENT TOTAL KNEE Left 11/12/2018  . shouler arthroscopy     bilateral   . TOTAL KNEE ARTHROPLASTY Right 09/30/2019   Procedure: TOTAL KNEE ARTHROPLASTY;  Surgeon: Paul Marvel, MD;  Location: [facility];  Service: Orthopedics;  Laterality: Right;    Social History   Socioeconomic History  . Marital status: Legally Separated    Spouse name: Not on file  . Number of children: Not on file  . Years of education: Not on file  . Highest education level: Not on  file  Occupational History  . Occupation: nurse  Tobacco Use  . Smoking status: Never Smoker  . Smokeless tobacco: Never Used  Substance and Sexual Activity  . Alcohol use: Yes    Comment: daily 2-3 beers   . Drug use: Yes    Types: Marijuana    Comment: every day or two  . Sexual activity: Yes  Other Topics Concern  . Not on file  Social History Narrative  . Not on file   Social Determinants of Health   Financial Resource Strain:   . Difficulty of Paying Living Expenses: Not on file  Food Insecurity:   . Worried About Programme researcher, broadcasting/film/video in the Last Year: Not on file  . Ran Out of Food in the Last Year: Not on file  Transportation Needs:   . Lack of Transportation (Medical): Not on file  . Lack of Transportation (Non-Medical): Not on file  Physical Activity:   . Days of Exercise per Week: Not on file  . Minutes of Exercise per Session: Not on file  Stress:   . Feeling of Stress : Not on file  Social Connections:   . Frequency of Communication with Friends and Family: Not on file  . Frequency of Social Gatherings with Friends and Family: Not on file  . Attends Religious Services: Not on file  . Active Member of Clubs or Organizations: Not on file  . Attends Club or  Organization Meetings: Not on file  . Marital Status: Not on file  Intimate Partner Violence:   . Fear of Current or Ex-Partner: Not on file  . Emotionally Abused: Not on file  . Physically Abused: Not on file  . Sexually Abused: Not on file    No Known Allergies  Family History  Adopted: Yes  Family history unknown: Yes    Prior to Admission medications   Medication Sig Start Date End Date Taking? Authorizing Provider  ALPRAZolam Prudy Feeler) 0.5 MG tablet Take 1 tablet (0.5 mg total) by mouth 2 (two) times daily as needed for anxiety. 08/08/19   Paul Mackintosh, MD  aspirin EC 325 MG EC tablet Take 1 tablet (325 mg total) by mouth daily with breakfast. 10/02/19   [name], Kirstin, PA-C  DULoxetine  (CYMBALTA) 60 MG capsule TAKE 1 CAPSULE BY MOUTH EVERY DAY 10/01/19   Paul Mackintosh, MD  gabapentin (NEURONTIN) 300 MG capsule 1 po qhs at bedtime for nerve pain 10/01/19   [name], Kirstin, PA-C  oxyCODONE (OXY IR/ROXICODONE) 5 MG immediate release tablet 1 po q 4 hrs prn pain.  Patient had a total knee replacement surgery on 09/30/2019 10/01/19   Paul Girt, PA-C    Physical Exam: Vitals:   11/10/19 1230 11/10/19 1330 11/10/19 1345 11/10/19 1400  BP: 117/78 122/79 136/81 (!) 145/85  Pulse: 77 76 67 73  Resp: (!) 21 17 14  (!) 24  Temp:      TempSrc:      SpO2: 100% 100% 100% 100%     . General:  Appears calm and comfortable and is NAD . Eyes:  PERRL, EOMI, normal lids, iris . ENT:  grossly normal hearing, lips & tongue, mmm; appropriate dentition . Neck:  no LAD, masses or thyromegaly . Cardiovascular:  RRR, no m/r/g. No LE edema.  Marland Kitchen Respiratory:   CTA bilaterally with no wheezes/rales/rhonchi.  Normal respiratory effort. . Abdomen:  soft, diffusely TTP particularly in B LQ, ND, NABS, no apparent peritoneal signs . Skin:  no rash or induration seen on limited exam . Musculoskeletal:  grossly normal tone BUE/BLE, good ROM, no bony abnormality . Psychiatric:  grossly normal mood and affect, speech fluent and appropriate, AOx3 . Neurologic:  CN 2-12 grossly intact, moves all extremities in coordinated fashion, sensation intact    Radiological Exams on Admission: CT ABDOMEN PELVIS W CONTRAST  Result Date: 11/10/2019 CLINICAL DATA:  Lower abdominal pain, fever, nausea, and diarrhea beginning last night. EXAM: CT ABDOMEN AND PELVIS WITH CONTRAST TECHNIQUE: Multidetector CT imaging of the abdomen and pelvis was performed using the standard protocol following bolus administration of intravenous contrast. CONTRAST:  OMNIPAQUE IOHEXOL 300 MG/ML  SOLN COMPARISON:  None. FINDINGS: Lower Chest: No acute findings. Hepatobiliary: No hepatic masses identified. Gallbladder is  unremarkable. No evidence of biliary ductal dilatation. Pancreas:  No mass or inflammatory changes. Spleen: Within normal limits in size and appearance. Adrenals/Urinary Tract: No masses identified. No evidence of hydronephrosis. Stomach/Bowel: Mild sigmoid diverticulitis is seen with a few tiny extraluminal air bubbles consistent with micro perforation. No evidence of free intraperitoneal air. No evidence of abscess or ascites. Tiny hiatal hernia noted. Vascular/Lymphatic: No pathologically enlarged lymph nodes. No abdominal aortic aneurysm. Aortic atherosclerosis incidentally noted. Reproductive:  No mass or other significant abnormality. Other:  None. Musculoskeletal:  No suspicious bone lesions identified. IMPRESSION: Mild sigmoid diverticulitis, with microperforation. No evidence of free intraperitoneal air or abscess. Aortic Atherosclerosis (ICD10-I70.0). Electronically Signed   By: Danae Orleans  M.D.   On: 11/10/2019 15:05   DG Chest Port 1 View  Result Date: 11/10/2019 CLINICAL DATA:  Fever. EXAM: PORTABLE CHEST 1 VIEW COMPARISON:  None. FINDINGS: The heart size and mediastinal contours are within normal limits. Aortic atherosclerosis incidentally noted. Both lungs are clear. The visualized skeletal structures are unremarkable. IMPRESSION: No active disease. Electronically Signed   By: Danae Orleans M.D.   On: 11/10/2019 13:05    EKG: Independently reviewed.  NSR with rate 77; no evidence of acute ischemia   Labs on Admission: I have personally reviewed the available labs and imaging studies at the time of the admission.  Pertinent labs:   Na++ 133 Glucose 121 Lactate 1.7 WBC 17.8 INR 1.1 UA: 100 protein BD COVID negative, confirmatory testing pending Blood and urine cultures pending  Assessment/Plan Principal Problem:   Diverticulitis of intestine with perforation Active Problems:   Depression with anxiety   Marijuana abuse   Alcohol abuse   Diverticulitis -Patient's symptoms  are c/w diverticulitis and his CT supports this as a diagnosis -His SIRS criteria are leukocytosis (17.8), fever, and tachypnea -He does not have apparent evidence of organ dysfunction and so this does not appear to be sepsis at this time -For now, will give bowel rest, IVF, pain medication with morphine, nausea medication with Zofran, and treat with Zosyn for intraabdominal infection -Given microperforation on CT, surgery has been consulted; however the patient does not appear to need acute surgical intervention at this time  Depressionanxiety -Very pleasant and appropriate, appears to be compensated at this time -Continue Cymbalta -Hold PO Xanax but Ativan as per CIWA protocol is ordered  ETOH abuse -Patient with acknowledged 2-3 beers daily -He is at increased risk for complications of withdrawal including seizures, DTs -Will observe -CIWA protocol -Will order MVI, thiamine, folate -TOC team consult regarding ETOH/marijuana counseling  Marijuana abuse -Cessation encouraged; this should be encouraged on an ongoing basis -UDS ordered    Note: This patient has been tested and is negative for the novel coronavirus COVID-19 by BD testing; confirmatory testing is pending.  DVT prophylaxis:  SCDs Code Status:  Full - confirmed with patient Family Communication: None present; his ex-wife and daughter both work in the ER and are aware of hospitalization  Disposition Plan:  Home once clinically improved Consults called: Surgery  Admission status: Admit - It is my clinical opinion that admission to INPATIENT is reasonable and necessary because of the expectation that this patient will require hospital care that crosses at least 2 midnights to treat this condition based on the medical complexity of the problems presented.  Given the aforementioned information, the predictability of an adverse outcome is felt to be significant.    Jonah Blue MD [facility]   How to contact  the [facility] Attending or Consulting provider 7A - 7P or covering provider during after hours 7P -7A, for this patient?  1. Check the care team in [facility] and look for a) attending/consulting TRH provider listed and b) the [facility] team listed 2. Log into [email or web address] and use [place]'s universal password to access. If you do not have the password, please contact the hospital operator. 3. Locate the [facility] provider you are looking for under [facility] and page to a number that you can be directly reached. 4. If you still have difficulty reaching the provider, please page the [facility] (Director on Call) for the Hospitalists listed on amion for assistance.   11/10/2019, 4:51 PM

## 2019-11-10 NOTE — Progress Notes (Signed)
Patient admitted to unit from emergency department. Patient settled in bed and oriented to unit and hospital routine. Pt resting comfortably in bed with call bell in reach and side rails up. Instructed patient to utilize call bell for assistance. Will continue to monitor closely for remainder of shift.

## 2019-11-11 LAB — COMPREHENSIVE METABOLIC PANEL
ALT: 11 U/L (ref 0–44)
AST: 11 U/L — ABNORMAL LOW (ref 15–41)
Albumin: 2.9 g/dL — ABNORMAL LOW (ref 3.5–5.0)
Alkaline Phosphatase: 68 U/L (ref 38–126)
Anion gap: 8 (ref 5–15)
BUN: 14 mg/dL (ref 8–23)
CO2: 25 mmol/L (ref 22–32)
Calcium: 8.5 mg/dL — ABNORMAL LOW (ref 8.9–10.3)
Chloride: 100 mmol/L (ref 98–111)
Creatinine, Ser: 1.11 mg/dL (ref 0.61–1.24)
GFR calc Af Amer: >60 mL/min (ref >60)
GFR calc non Af Amer: >60 mL/min (ref >60)
Glucose, Bld: 114 mg/dL — ABNORMAL HIGH (ref 70–99)
Potassium: 3.9 mmol/L (ref 3.5–5.1)
Sodium: 133 mmol/L — ABNORMAL LOW (ref 135–145)
Total Bilirubin: 1.1 mg/dL (ref 0.3–1.2)
Total Protein: 5.5 g/dL — ABNORMAL LOW (ref 6.5–8.1)

## 2019-11-11 LAB — CBC
HCT: 39.8 % (ref 39.0–52.0)
HCT: 41.4 % (ref 39.0–52.0)
Hemoglobin: 13.5 g/dL (ref 13.0–17.0)
Hemoglobin: 14 g/dL (ref 13.0–17.0)
MCH: 30.5 pg (ref 26.0–34.0)
MCH: 30.8 pg (ref 26.0–34.0)
MCHC: 33.9 g/dL (ref 30.0–36.0)
MCHC: 33.8 g/dL (ref 30.0–36.0)
MCV: 90 fL (ref 80.0–100.0)
MCV: 91 fL (ref 80.0–100.0)
Platelets: 230 10*3/uL (ref 150–400)
Platelets: 198 10*3/uL (ref 150–400)
RBC: 4.42 MIL/uL (ref 4.22–5.81)
RBC: 4.55 MIL/uL (ref 4.22–5.81)
RDW: 13.1 % (ref 11.5–15.5)
RDW: 13 % (ref 11.5–15.5)
WBC: 17.3 10*3/uL — ABNORMAL HIGH (ref 4.0–10.5)
WBC: 15.9 10*3/uL — ABNORMAL HIGH (ref 4.0–10.5)
nRBC: 0 % (ref 0.0–0.2)
nRBC: 0 % (ref 0.0–0.2)

## 2019-11-11 LAB — CREATININE, SERUM
Creatinine, Ser: 1.03 mg/dL (ref 0.61–1.24)
GFR calc Af Amer: >60 mL/min (ref >60)
GFR calc non Af Amer: >60 mL/min (ref >60)

## 2019-11-11 LAB — PHOSPHORUS: Phosphorus: 2.9 mg/dL (ref 2.5–4.6)

## 2019-11-11 LAB — HEMOGLOBIN A1C
Hgb A1c MFr Bld: 4.7 % — ABNORMAL LOW (ref 4.8–5.6)
Mean Plasma Glucose: 88.19 mg/dL

## 2019-11-11 LAB — MAGNESIUM: Magnesium: 1.9 mg/dL (ref 1.7–2.4)

## 2019-11-11 LAB — URINE CULTURE: Culture: NO GROWTH

## 2019-11-11 MED ORDER — ENOXAPARIN SODIUM 40 MG/0.4ML ~~LOC~~ SOLN
40.0000 mg | SUBCUTANEOUS | Status: DC
  Administered 2019-11-11 – 2019-11-14 (×4): 40 mg via SUBCUTANEOUS
  Filled 2019-11-11 (×4): qty 0.4

## 2019-11-11 MED ORDER — CYCLOBENZAPRINE HCL 5 MG PO TABS: 5.0000 mg | ORAL_TABLET | Freq: Once | ORAL | Status: DC

## 2019-11-11 NOTE — Progress Notes (Signed)
PROGRESS NOTE    Paul [name]  CWC:[id number] DOB: 08/26/1951 DOA: 11/10/2019 PCP: Margaree Mackintosh, MD  Brief Narrative: Paul [name] is a 68 y.o. male with medical history significant of depression/anxiety and remote Hep C with recent (10/29) L TKR presented to the ED 12/20 with abdominal pain, fever, mild nausea and vomiting  -CT the ED showed mild diverticulitis with microperforation   Assessment & Plan:   Acute diverticulitis with microperforation -CT findings noted, white count was 17.8 yesterday, now improving slowly to 15 -No fevers overnight, start clear liquids, continue IV fluids, pain control/supportive care -General surgery consulting, recommended conservative management -Patient reports having a colonoscopy in the last 3 to 5 years which noted diverticulosis  Depressionanxiety -Continue Cymbalta  ETOH abuse -Patient drinks about 3 beers daily, currently on CIWA protocol, no withdrawal yet  Mild hyperglycemia -Check hemoglobin A1c  DVT prophylaxis:   Add Lovenox Code Status:  Full - Family Communication:  Discussed with patient Disposition Plan:  Home likely in 1 to 2 days Consultants:   General surgery   Procedures:   Antimicrobials:    Subjective: -Mild abdominal discomfort, some nausea overnight but feels better today  Objective: Vitals:   11/10/19 2226 11/11/19 0230 11/11/19 0624 11/11/19 0743  BP: 116/61 112/69 119/68   Pulse: 90 92 84   Resp: 14 14 14    Temp: 99.4 F (37.4 C) 99.7 F (37.6 C) 99.4 F (37.4 C) 99.3 F (37.4 C)  TempSrc: Oral Oral Oral Oral  SpO2: 99% 98% 99%     Intake/Output Summary (Last 24 hours) at 11/11/2019 1223 Last data filed at 11/11/2019 1037 Gross per 24 hour  Intake 2204.21 ml  Output 253 ml  Net 1951.21 ml   There were no vitals filed for this visit.  Examination:  General exam: Averagely built male sitting up in bed, AAOx3, no distress  respiratory system: Clear to auscultation bilaterally  Cardiovascular system: S1 & S2 heard, RRR Gastrointestinal system: Soft, nondistended, mild left lower quadrant tenderness with minimal guarding, bowel sounds increased Central nervous system: Alert and oriented. No focal neurological deficits. Extremities: No edema Skin: No rashes, lesions or ulcers Psychiatry:  Mood & affect appropriate.     Data Reviewed:   CBC: Recent Labs  Lab 11/10/19 1249 11/11/19 0427 11/11/19 0723  WBC 17.8* 17.3* 15.9*  NEUTROABS 16.4*  --   --   HGB 15.3 13.5 14.0  HCT 44.2 39.8 41.4  MCV 88.9 90.0 91.0  PLT 244 230 198   Basic Metabolic Panel: Recent Labs  Lab 11/10/19 1249 11/11/19 0427 11/11/19 0723  NA 133* 133*  --   K 3.6 3.9  --   CL 98 100  --   CO2 23 25  --   GLUCOSE 121* 114*  --   BUN 19 14  --   CREATININE 0.99 1.11 1.03  CALCIUM 9.2 8.5*  --   MG  --  1.9  --   PHOS  --  2.9  --    GFR: CrCl cannot be calculated (Unknown ideal weight.). Liver Function Tests: Recent Labs  Lab 11/10/19 1249 11/11/19 0427  AST 13* 11*  ALT 13 11  ALKPHOS 68 68  BILITOT 1.2 1.1  PROT 6.9 5.5*  ALBUMIN 3.5 2.9*   No results for input(s): LIPASE, AMYLASE in the last 168 hours. No results for input(s): AMMONIA in the last 168 hours. Coagulation Profile: Recent Labs  Lab 11/10/19 1249  INR 1.1   Cardiac Enzymes:  No results for input(s): CKTOTAL, CKMB, CKMBINDEX, TROPONINI in the last 168 hours. BNP (last 3 results) No results for input(s): PROBNP in the last 8760 hours. HbA1C: No results for input(s): HGBA1C in the last 72 hours. CBG: No results for input(s): GLUCAP in the last 168 hours. Lipid Profile: No results for input(s): CHOL, HDL, LDLCALC, TRIG, CHOLHDL, LDLDIRECT in the last 72 hours. Thyroid Function Tests: No results for input(s): TSH, T4TOTAL, FREET4, T3FREE, THYROIDAB in the last 72 hours. Anemia Panel: No results for input(s): VITAMINB12, FOLATE, FERRITIN, TIBC, IRON, RETICCTPCT in the last 72 hours. Urine  analysis:    Component Value Date/Time   COLORURINE AMBER (A) 11/10/2019 1406   APPEARANCEUR CLEAR 11/10/2019 1406   LABSPEC 1.032 (H) 11/10/2019 1406   PHURINE 6.0 11/10/2019 1406   GLUCOSEU NEGATIVE 11/10/2019 1406   HGBUR NEGATIVE 11/10/2019 1406   BILIRUBINUR NEGATIVE 11/10/2019 1406   BILIRUBINUR NEG 10/07/2019 1529   KETONESUR NEGATIVE 11/10/2019 1406   PROTEINUR 100 (A) 11/10/2019 1406   UROBILINOGEN 0.2 10/07/2019 1529   NITRITE NEGATIVE 11/10/2019 1406   LEUKOCYTESUR NEGATIVE 11/10/2019 1406   Sepsis Labs: @LABRCNTIP (procalcitonin:4,lacticidven:4)  ) Recent Results (from the past 240 hour(s))  Blood Culture (routine x 2)     Status: None (Preliminary result)   Collection Time: 11/10/19 12:57 PM   Specimen: BLOOD  Result Value Ref Range Status   Specimen Description BLOOD RIGHT ANTECUBITAL  Final   Special Requests   Final    BOTTLES DRAWN AEROBIC AND ANAEROBIC Blood Culture results may not be optimal due to an excessive volume of blood received in culture bottles   Culture   Final    NO GROWTH < 24 HOURS Performed at [facility], [address] [place], [place] [address]    Report Status PENDING  Incomplete  Blood Culture (routine x 2)     Status: None (Preliminary result)   Collection Time: 11/10/19 12:58 PM   Specimen: BLOOD RIGHT HAND  Result Value Ref Range Status   Specimen Description BLOOD RIGHT HAND  Final   Special Requests   Final    BOTTLES DRAWN AEROBIC AND ANAEROBIC Blood Culture results may not be optimal due to an excessive volume of blood received in culture bottles   Culture   Final    NO GROWTH < 24 HOURS Performed at [facility], [address], [place], [place] [address]    Report Status PENDING  Incomplete  SARS CORONAVIRUS 2 (TAT 6-24 HRS) Nasopharyngeal Nasopharyngeal Swab     Status: None   Collection Time: 11/10/19  2:04 PM   Specimen: Nasopharyngeal Swab  Result Value Ref Range Status   SARS Coronavirus 2 NEGATIVE  NEGATIVE Final    Comment: (NOTE) SARS-CoV-2 target nucleic acids are NOT DETECTED. The SARS-CoV-2 RNA is generally detectable in upper and lower respiratory specimens during the acute phase of infection. Negative results do not preclude SARS-CoV-2 infection, do not rule out co-infections with other pathogens, and should not be used as the sole basis for treatment or other patient management decisions. Negative results must be combined with clinical observations, patient history, and epidemiological information. The expected result is Negative. Fact Sheet for Patients: HairSlick.no Fact Sheet for Healthcare Providers: quierodirigir.com This test is not yet approved or cleared by the [place] FDA and  has been authorized for detection and/or diagnosis of SARS-CoV-2 by FDA under an Emergency Use Authorization (EUA). This EUA will remain  in effect (meaning this test can be  used) for the duration of the COVID-19 declaration under Section 56 4(b)(1) of the Act, 21 U.S.C. section 360bbb-3(b)(1), unless the authorization is terminated or revoked sooner. Performed at [facility], [address] [place], [place] [address]   Urine culture     Status: None   Collection Time: 11/10/19  2:06 PM   Specimen: In/Out Cath Urine  Result Value Ref Range Status   Specimen Description IN/OUT CATH URINE  Final   Special Requests NONE  Final   Culture   Final    NO GROWTH Performed at [facility], [address] [place], [place] [address]    Report Status 11/11/2019 FINAL  Final         Radiology Studies: CT ABDOMEN PELVIS W CONTRAST  Result Date: 11/10/2019 CLINICAL DATA:  Lower abdominal pain, fever, nausea, and diarrhea beginning last night. EXAM: CT ABDOMEN AND PELVIS WITH CONTRAST TECHNIQUE: Multidetector CT imaging of the abdomen and pelvis was performed using the standard protocol following bolus administration  of intravenous contrast. CONTRAST:  OMNIPAQUE IOHEXOL 300 MG/ML  SOLN COMPARISON:  None. FINDINGS: Lower Chest: No acute findings. Hepatobiliary: No hepatic masses identified. Gallbladder is unremarkable. No evidence of biliary ductal dilatation. Pancreas:  No mass or inflammatory changes. Spleen: Within normal limits in size and appearance. Adrenals/Urinary Tract: No masses identified. No evidence of hydronephrosis. Stomach/Bowel: Mild sigmoid diverticulitis is seen with a few tiny extraluminal air bubbles consistent with micro perforation. No evidence of free intraperitoneal air. No evidence of abscess or ascites. Tiny hiatal hernia noted. Vascular/Lymphatic: No pathologically enlarged lymph nodes. No abdominal aortic aneurysm. Aortic atherosclerosis incidentally noted. Reproductive:  No mass or other significant abnormality. Other:  None. Musculoskeletal:  No suspicious bone lesions identified. IMPRESSION: Mild sigmoid diverticulitis, with microperforation. No evidence of free intraperitoneal air or abscess. Aortic Atherosclerosis (ICD10-I70.0). Electronically Signed   By: Danae Orleans M.D.   On: 11/10/2019 15:05   DG Chest Port 1 View  Result Date: 11/10/2019 CLINICAL DATA:  Fever. EXAM: PORTABLE CHEST 1 VIEW COMPARISON:  None. FINDINGS: The heart size and mediastinal contours are within normal limits. Aortic atherosclerosis incidentally noted. Both lungs are clear. The visualized skeletal structures are unremarkable. IMPRESSION: No active disease. Electronically Signed   By: Danae Orleans M.D.   On: 11/10/2019 13:05        Scheduled Meds: . DULoxetine  60 mg Oral Daily  . enoxaparin (LOVENOX) injection  40 mg Subcutaneous Q24H  . folic acid  1 mg Oral Daily  . multivitamin with minerals  1 tablet Oral Daily  . thiamine  100 mg Oral Daily   Or  . thiamine  100 mg Intravenous Daily   Continuous Infusions: . lactated ringers 75 mL/hr at 11/11/19 1036  . piperacillin-tazobactam (ZOSYN)   IV 12.5 mL/hr at 11/11/19 0500     LOS: 1 day    Time spent:    Zannie Cove, MD [facility] 11/11/2019, 12:23 PM

## 2019-11-11 NOTE — Progress Notes (Signed)
Subjective: Pleasant and cooperative.  Alert.  No obvious distress. States that pain is still present but 50% better Had 3 loose stools since admission No nausea Lab work today shows WBC 17,300.  Hemoglobin stable 13.5.  Creatinine 1.11.  Potassium 3.9  Objective: Vital signs in last 24 hours: Temp:  [98.9 F (37.2 C)-102 F (38.9 C)] 99.4 F (37.4 C) (12/21 0624) Pulse Rate:  [67-92] 84 (12/21 0624) Resp:  [9-24] 14 (12/21 0624) BP: (112-145)/(61-85) 119/68 (12/21 0624) SpO2:  [98 %-100 %] 99 % (12/21 0624) Last BM Date: 11/10/19  Intake/Output from previous day: 12/20 0701 - 12/21 0700 In: 2144.2 [P.O.:60; I.V.:1042; IV Piggyback:1042.2] Out: 3 [Stool:3] Intake/Output this shift: Total I/O In: 1144.2 [P.O.:60; I.V.:1042; IV Piggyback:42.2] Out: 3 [Stool:3]  General appearance: Alert and cooperative.  Does not appear uncomfortable.  Mental status normal Resp: clear to auscultation bilaterally GI: Soft.  Hypoactive bowel sounds.  Some tenderness lower midline and left lower quadrant but no mass or peritoneal signs. Extremities: extremities normal, atraumatic, no cyanosis or edema  Lab Results:  Recent Labs    11/10/19 1249 11/11/19 0427  WBC 17.8* 17.3*  HGB 15.3 13.5  HCT 44.2 39.8  PLT 244 230   BMET Recent Labs    11/10/19 1249 11/11/19 0427  NA 133* 133*  K 3.6 3.9  CL 98 100  CO2 23 25  GLUCOSE 121* 114*  BUN 19 14  CREATININE 0.99 1.11  CALCIUM 9.2 8.5*   PT/INR Recent Labs    11/10/19 1249  LABPROT 14.4  INR 1.1   ABG No results for input(s): PHART, HCO3 in the last 72 hours.  Invalid input(s): PCO2, PO2  Studies/Results: CT ABDOMEN PELVIS W CONTRAST  Result Date: 11/10/2019 CLINICAL DATA:  Lower abdominal pain, fever, nausea, and diarrhea beginning last night. EXAM: CT ABDOMEN AND PELVIS WITH CONTRAST TECHNIQUE: Multidetector CT imaging of the abdomen and pelvis was performed using the standard protocol following bolus  administration of intravenous contrast. CONTRAST:  156mL OMNIPAQUE IOHEXOL 300 MG/ML  SOLN COMPARISON:  None. FINDINGS: Lower Chest: No acute findings. Hepatobiliary: No hepatic masses identified. Gallbladder is unremarkable. No evidence of biliary ductal dilatation. Pancreas:  No mass or inflammatory changes. Spleen: Within normal limits in size and appearance. Adrenals/Urinary Tract: No masses identified. No evidence of hydronephrosis. Stomach/Bowel: Mild sigmoid diverticulitis is seen with a few tiny extraluminal air bubbles consistent with micro perforation. No evidence of free intraperitoneal air. No evidence of abscess or ascites. Tiny hiatal hernia noted. Vascular/Lymphatic: No pathologically enlarged lymph nodes. No abdominal aortic aneurysm. Aortic atherosclerosis incidentally noted. Reproductive:  No mass or other significant abnormality. Other:  None. Musculoskeletal:  No suspicious bone lesions identified. IMPRESSION: Mild sigmoid diverticulitis, with microperforation. No evidence of free intraperitoneal air or abscess. Aortic Atherosclerosis (ICD10-I70.0). Electronically Signed   By: Marlaine Hind M.D.   On: 11/10/2019 15:05   DG Chest Port 1 View  Result Date: 11/10/2019 CLINICAL DATA:  Fever. EXAM: PORTABLE CHEST 1 VIEW COMPARISON:  None. FINDINGS: The heart size and mediastinal contours are within normal limits. Aortic atherosclerosis incidentally noted. Both lungs are clear. The visualized skeletal structures are unremarkable. IMPRESSION: No active disease. Electronically Signed   By: Marlaine Hind M.D.   On: 11/10/2019 13:05    Anti-infectives: Anti-infectives (From admission, onward)   Start     Dose/Rate Route Frequency Ordered Stop   11/10/19 2200  piperacillin-tazobactam (ZOSYN) IVPB 3.375 g     3.375 g 12.5 mL/hr over 240 Minutes Intravenous  Every 8 hours 11/10/19 1642     11/10/19 1530  piperacillin-tazobactam (ZOSYN) IVPB 3.375 g     3.375 g 100 mL/hr over 30 Minutes  Intravenous  Once 11/10/19 1527 11/10/19 1625      Assessment/Plan:  Acute sigmoid diverticulitis.  Microperforation but no evidence of obstruction or bleeding. No indication for acute surgical intervention Explained algorithms of care to patient  Leukocytosis.  Hopefully this will resolve as his infectious process comes under control  Diarrhea.  Probably physiologic.  Doubt nosocomial infection  Allow clear liquids Ambulate Repeat labs tomorrow Lovenox for DVT prophylaxis   LOS: 1 day    Paul [name] 11/11/2019

## 2019-11-12 LAB — C DIFFICILE QUICK SCREEN W PCR REFLEX
C Diff antigen: NEGATIVE
C Diff interpretation: NOT DETECTED
C Diff toxin: NEGATIVE

## 2019-11-12 LAB — CBC
HCT: 37.7 % — ABNORMAL LOW (ref 39.0–52.0)
Hemoglobin: 12.8 g/dL — ABNORMAL LOW (ref 13.0–17.0)
MCH: 30.7 pg (ref 26.0–34.0)
MCHC: 34 g/dL (ref 30.0–36.0)
MCV: 90.4 fL (ref 80.0–100.0)
Platelets: 221 10*3/uL (ref 150–400)
RBC: 4.17 MIL/uL — ABNORMAL LOW (ref 4.22–5.81)
RDW: 12.9 % (ref 11.5–15.5)
WBC: 11.8 10*3/uL — ABNORMAL HIGH (ref 4.0–10.5)
nRBC: 0 % (ref 0.0–0.2)

## 2019-11-12 LAB — BASIC METABOLIC PANEL
Anion gap: 11 (ref 5–15)
BUN: 10 mg/dL (ref 8–23)
CO2: 26 mmol/L (ref 22–32)
Calcium: 8.7 mg/dL — ABNORMAL LOW (ref 8.9–10.3)
Chloride: 98 mmol/L (ref 98–111)
Creatinine, Ser: 1.12 mg/dL (ref 0.61–1.24)
GFR calc Af Amer: >60 mL/min (ref >60)
GFR calc non Af Amer: >60 mL/min (ref >60)
Glucose, Bld: 100 mg/dL — ABNORMAL HIGH (ref 70–99)
Potassium: 3.7 mmol/L (ref 3.5–5.1)
Sodium: 135 mmol/L (ref 135–145)

## 2019-11-12 MED ORDER — LOPERAMIDE HCL 1 MG/7.5ML PO SUSP
2.0000 mg | ORAL | Status: DC | PRN
  Administered 2019-11-12 – 2019-11-14 (×3): 2 mg via ORAL
  Filled 2019-11-12 (×5): qty 15

## 2019-11-12 NOTE — Progress Notes (Signed)
PROGRESS NOTE  Paul [name] ZOX:[id number] DOB: Dec 29, 1950 DOA: 11/10/2019 PCP: Margaree Mackintosh, MD  HPI/Recap of past 34 hours: 68 year old male history of depression/anxiety, hepatitis C, left total knee replacement who presented to ED [facility] on 11/10/2019 from home with complaints of abdominal pain, fever, nausea and vomiting.  CT abdomen and pelvis showed mild diverticulitis with microperforation.  Admitted by [facility] and surgery consulted.  11/12/19: Seen and examined.  Reports multiple watery stools.  C. difficile PCR negative.  GI panel in process.   Assessment/Plan: Principal Problem:   Diverticulitis of intestine with perforation Active Problems:   Depression with anxiety   Marijuana abuse   Alcohol abuse  Sepsis secondary to acute diverticulitis with microperforation Presented with leukocytosis, T-max 102, and CT findings suggestive of acute diverticulitis with microperforation Sepsis physiology is resolving Started on Zosyn, continue Leukocytosis trending down, T-max 99.7. Blood cultures x2 - today Pain management as needed Tolerating a clear liquid diet Ambulate as tolerated General surgery following  Diarrhea Reports multiple watery stools.  C. difficile PCR negative.  GI panel in process. Continue IV fluid Closely monitor electrolytes and replete  Mild hyperglycemia Hemoglobin A1c 4.7  Depression/anxiety Continue Cymbalta  Alcohol abuse Continue CIWA protocol No sign of alcohol withdrawal   DVT prophylaxis: Lovenox subcu daily Code Status:Full - Family Communication:  None at bedside. Disposition Plan: Home likely in 1 to 2 days or when surgery signs off.  Consultants:   General surgery   Procedures:  None.  Antimicrobials:    Objective: Vitals:   11/11/19 1423 11/11/19 2048 11/12/19 0555 11/12/19 1636  BP: 124/74 119/72 128/73 126/82  Pulse: 76 82 77 81  Resp:  18 18 18   Temp: 99 F (37.2 C) 99.7 F (37.6 C) 99 F (37.2 C) 99.1  F (37.3 C)  TempSrc: Oral Oral Oral Oral  SpO2: 100% 100% 99% 100%    Intake/Output Summary (Last 24 hours) at 11/12/2019 2039 Last data filed at 11/12/2019 1638 Gross per 24 hour  Intake 652.5 ml  Output 1304 ml  Net -651.5 ml   There were no vitals filed for this visit.  Exam:  . General: 68 y.o. year-old male well developed well nourished in no acute distress.  Alert and oriented x3. . Cardiovascular: Regular rate and rhythm with no rubs or gallops.  No thyromegaly or JVD noted.   Marland Kitchen Respiratory: Clear to auscultation with no wheezes or rales. Good inspiratory effort. . Abdomen: Soft left lower quadrant tenderness on palpation.  Bowel sounds present.  . Musculoskeletal: No lower extremity edema. 2/4 pulses in all 4 extremities. Marland Kitchen Psychiatry: Mood is appropriate for condition and setting   Data Reviewed: CBC: Recent Labs  Lab 11/10/19 1249 11/11/19 0427 11/11/19 0723 11/12/19 0232  WBC 17.8* 17.3* 15.9* 11.8*  NEUTROABS 16.4*  --   --   --   HGB 15.3 13.5 14.0 12.8*  HCT 44.2 39.8 41.4 37.7*  MCV 88.9 90.0 91.0 90.4  PLT 244 230 198 221   Basic Metabolic Panel: Recent Labs  Lab 11/10/19 1249 11/11/19 0427 11/11/19 0723 11/12/19 0232  NA 133* 133*  --  135  K 3.6 3.9  --  3.7  CL 98 100  --  98  CO2 23 25  --  26  GLUCOSE 121* 114*  --  100*  BUN 19 14  --  10  CREATININE 0.99 1.11 1.03 1.12  CALCIUM 9.2 8.5*  --  8.7*  MG  --  1.9  --   --  PHOS  --  2.9  --   --    GFR: CrCl cannot be calculated (Unknown ideal weight.). Liver Function Tests: Recent Labs  Lab 11/10/19 1249 11/11/19 0427  AST 13* 11*  ALT 13 11  ALKPHOS 68 68  BILITOT 1.2 1.1  PROT 6.9 5.5*  ALBUMIN 3.5 2.9*   No results for input(s): LIPASE, AMYLASE in the last 168 hours. No results for input(s): AMMONIA in the last 168 hours. Coagulation Profile: Recent Labs  Lab 11/10/19 1249  INR 1.1   Cardiac Enzymes: No results for input(s): CKTOTAL, CKMB, CKMBINDEX, TROPONINI  in the last 168 hours. BNP (last 3 results) No results for input(s): PROBNP in the last 8760 hours. HbA1C: Recent Labs    11/11/19 0723  HGBA1C 4.7*   CBG: No results for input(s): GLUCAP in the last 168 hours. Lipid Profile: No results for input(s): CHOL, HDL, LDLCALC, TRIG, CHOLHDL, LDLDIRECT in the last 72 hours. Thyroid Function Tests: No results for input(s): TSH, T4TOTAL, FREET4, T3FREE, THYROIDAB in the last 72 hours. Anemia Panel: No results for input(s): VITAMINB12, FOLATE, FERRITIN, TIBC, IRON, RETICCTPCT in the last 72 hours. Urine analysis:    Component Value Date/Time   COLORURINE AMBER (A) 11/10/2019 1406   APPEARANCEUR CLEAR 11/10/2019 1406   LABSPEC 1.032 (H) 11/10/2019 1406   PHURINE 6.0 11/10/2019 1406   GLUCOSEU NEGATIVE 11/10/2019 1406   HGBUR NEGATIVE 11/10/2019 1406   BILIRUBINUR NEGATIVE 11/10/2019 1406   BILIRUBINUR NEG 10/07/2019 1529   KETONESUR NEGATIVE 11/10/2019 1406   PROTEINUR 100 (A) 11/10/2019 1406   UROBILINOGEN 0.2 10/07/2019 1529   NITRITE NEGATIVE 11/10/2019 1406   LEUKOCYTESUR NEGATIVE 11/10/2019 1406   Sepsis Labs: @LABRCNTIP (procalcitonin:4,lacticidven:4)  ) Recent Results (from the past 240 hour(s))  Blood Culture (routine x 2)     Status: None (Preliminary result)   Collection Time: 11/10/19 12:57 PM   Specimen: BLOOD  Result Value Ref Range Status   Specimen Description BLOOD RIGHT ANTECUBITAL  Final   Special Requests   Final    BOTTLES DRAWN AEROBIC AND ANAEROBIC Blood Culture results may not be optimal due to an excessive volume of blood received in culture bottles   Culture   Final    NO GROWTH 2 DAYS Performed at [facility], [address] [place], [place] [address]    Report Status PENDING  Incomplete  Blood Culture (routine x 2)     Status: None (Preliminary result)   Collection Time: 11/10/19 12:58 PM   Specimen: BLOOD RIGHT HAND  Result Value Ref Range Status   Specimen Description BLOOD RIGHT HAND   Final   Special Requests   Final    BOTTLES DRAWN AEROBIC AND ANAEROBIC Blood Culture results may not be optimal due to an excessive volume of blood received in culture bottles   Culture   Final    NO GROWTH 2 DAYS Performed at [facility], [address], [place], [place] [address]    Report Status PENDING  Incomplete  SARS CORONAVIRUS 2 (TAT 6-24 HRS) Nasopharyngeal Nasopharyngeal Swab     Status: None   Collection Time: 11/10/19  2:04 PM   Specimen: Nasopharyngeal Swab  Result Value Ref Range Status   SARS Coronavirus 2 NEGATIVE NEGATIVE Final    Comment: (NOTE) SARS-CoV-2 target nucleic acids are NOT DETECTED. The SARS-CoV-2 RNA is generally detectable in upper and lower respiratory specimens during the acute phase of infection. Negative results do not preclude SARS-CoV-2 infection, do not rule  out co-infections with other pathogens, and should not be used as the sole basis for treatment or other patient management decisions. Negative results must be combined with clinical observations, patient history, and epidemiological information. The expected result is Negative. Fact Sheet for Patients: HairSlick.no Fact Sheet for Healthcare Providers: quierodirigir.com This test is not yet approved or cleared by the [place] FDA and  has been authorized for detection and/or diagnosis of SARS-CoV-2 by FDA under an Emergency Use Authorization (EUA). This EUA will remain  in effect (meaning this test can be used) for the duration of the COVID-19 declaration under Section 56 4(b)(1) of the Act, 21 U.S.C. section 360bbb-3(b)(1), unless the authorization is terminated or revoked sooner. Performed at [facility], [address], [place], [place] [address]   Urine culture     Status: None   Collection Time: 11/10/19  2:06 PM   Specimen: In/Out Cath Urine  Result Value Ref Range Status   Specimen Description IN/OUT CATH  URINE  Final   Special Requests NONE  Final   Culture   Final    NO GROWTH Performed at [facility], [address], [place], [place] [address]    Report Status 11/11/2019 FINAL  Final  C Difficile Quick Screen w PCR reflex     Status: None   Collection Time: 11/12/19  8:57 AM   Specimen: Stool  Result Value Ref Range Status   C Diff antigen NEGATIVE NEGATIVE Final   C Diff toxin NEGATIVE NEGATIVE Final   C Diff interpretation No C. difficile detected.  Final    Comment: Performed at [facility], [address], [place], [place] [address]      Studies: No results found.  Scheduled Meds: . DULoxetine  60 mg Oral Daily  . enoxaparin (LOVENOX) injection  40 mg Subcutaneous Q24H  . folic acid  1 mg Oral Daily  . multivitamin with minerals  1 tablet Oral Daily  . thiamine  100 mg Oral Daily   Or  . thiamine  100 mg Intravenous Daily    Continuous Infusions: . lactated ringers 75 mL/hr at 11/12/19 1704  . piperacillin-tazobactam (ZOSYN)  IV 3.375 g (11/12/19 1421)     LOS: 2 days     Darlin Drop, MD [facility] Pager [phone]  If 7PM-7AM, please contact night-coverage [email or web address] Password [facility] 11/12/2019, 8:39 PM

## 2019-11-12 NOTE — Progress Notes (Addendum)
Subjective: Tolerating clear liquids but having a lot of diarrhea. He says he may have had as much as 10 stools in the last 24 hours, and this is stressful and anxiety producing. Abdominal pain improved. No fever.  No tachycardia. Leukocytosis resolving with WBC 11,800.  Be met normal  C/o lumbosacral back pain for 6-8 weeks. No radicular pain. Had steroid injection with Dr. Noemi Chapel. Scheduled to see Dr. Ronnald Ramp from neurosurgery  next week.   Objective: Vital signs in last 24 hours: Temp:  [99 F (37.2 C)-99.7 F (37.6 C)] 99 F (37.2 C) (12/22 0555) Pulse Rate:  [76-82] 77 (12/22 0555) Resp:  [18] 18 (12/22 0555) BP: (119-128)/(72-74) 128/73 (12/22 0555) SpO2:  [99 %-100 %] 99 % (12/22 0555) Last BM Date: 11/11/19  Intake/Output from previous day: 12/21 0701 - 12/22 0700 In: 1795.9 [P.O.:240; I.V.:1555.9] Out: 1050 [Urine:1050] Intake/Output this shift: Total I/O In: 1795.9 [P.O.:240; I.V.:1555.9] Out: 300 [Urine:300]   EXAM: General appearance: Alert and cooperative.  Does not appear uncomfortable.  Mental status normal.  Mild anxiety and frustration secondary to diarrhea Resp: clear to auscultation bilaterally GI: Soft.  Hypoactive bowel sounds.  Some tenderness lower midline and left lower quadrant but no mass or peritoneal signs.  This has improved. Extremities: extremities normal, atraumatic, no cyanosis or edema   Lab Results:  Recent Labs    11/11/19 0723 11/12/19 0232  WBC 15.9* 11.8*  HGB 14.0 12.8*  HCT 41.4 37.7*  PLT 198 221   BMET Recent Labs    11/11/19 0427 11/11/19 0723 11/12/19 0232  NA 133*  --  135  K 3.9  --  3.7  CL 100  --  98  CO2 25  --  26  GLUCOSE 114*  --  100*  BUN 14  --  10  CREATININE 1.11 1.03 1.12  CALCIUM 8.5*  --  8.7*   PT/INR Recent Labs    11/10/19 1249  LABPROT 14.4  INR 1.1   ABG No results for input(s): PHART, HCO3 in the last 72 hours.  Invalid input(s): PCO2, PO2  Studies/Results: CT ABDOMEN  PELVIS W CONTRAST  Result Date: 11/10/2019 CLINICAL DATA:  Lower abdominal pain, fever, nausea, and diarrhea beginning last night. EXAM: CT ABDOMEN AND PELVIS WITH CONTRAST TECHNIQUE: Multidetector CT imaging of the abdomen and pelvis was performed using the standard protocol following bolus administration of intravenous contrast. CONTRAST:  145m OMNIPAQUE IOHEXOL 300 MG/ML  SOLN COMPARISON:  None. FINDINGS: Lower Chest: No acute findings. Hepatobiliary: No hepatic masses identified. Gallbladder is unremarkable. No evidence of biliary ductal dilatation. Pancreas:  No mass or inflammatory changes. Spleen: Within normal limits in size and appearance. Adrenals/Urinary Tract: No masses identified. No evidence of hydronephrosis. Stomach/Bowel: Mild sigmoid diverticulitis is seen with a few tiny extraluminal air bubbles consistent with micro perforation. No evidence of free intraperitoneal air. No evidence of abscess or ascites. Tiny hiatal hernia noted. Vascular/Lymphatic: No pathologically enlarged lymph nodes. No abdominal aortic aneurysm. Aortic atherosclerosis incidentally noted. Reproductive:  No mass or other significant abnormality. Other:  None. Musculoskeletal:  No suspicious bone lesions identified. IMPRESSION: Mild sigmoid diverticulitis, with microperforation. No evidence of free intraperitoneal air or abscess. Aortic Atherosclerosis (ICD10-I70.0). Electronically Signed   By: JMarlaine HindM.D.   On: 11/10/2019 15:05   DG Chest Port 1 View  Result Date: 11/10/2019 CLINICAL DATA:  Fever. EXAM: PORTABLE CHEST 1 VIEW COMPARISON:  None. FINDINGS: The heart size and mediastinal contours are within normal limits. Aortic atherosclerosis incidentally noted.  Both lungs are clear. The visualized skeletal structures are unremarkable. IMPRESSION: No active disease. Electronically Signed   By: Marlaine Hind M.D.   On: 11/10/2019 13:05    Anti-infectives: Anti-infectives (From admission, onward)   Start      Dose/Rate Route Frequency Ordered Stop   11/10/19 2200  piperacillin-tazobactam (ZOSYN) IVPB 3.375 g     3.375 g 12.5 mL/hr over 240 Minutes Intravenous Every 8 hours 11/10/19 1642     11/10/19 1530  piperacillin-tazobactam (ZOSYN) IVPB 3.375 g     3.375 g 100 mL/hr over 30 Minutes Intravenous  Once 11/10/19 1527 11/10/19 1625      Assessment/Plan:   Acute sigmoid diverticulitis.  Microperforation but no evidence of obstruction or bleeding. No indication for acute surgical intervention Explained algorithms of care to patient  Leukocytosis.    Resolving.  Suspect infectious aspect of diverticulitis improving  Diarrhea.   This is now more of a problem and, although possibly physiologic, is more profound than we usually see in resolving diverticulitis, especially this early in treatment course. We will check C. difficile and GI panel on stool Imodium as needed  Lumbosacral back pain. Ambulate. Ask NS to see if worsens.  Allow clear liquids Ambulate Repeat labs tomorrow Lovenox for DVT prophylaxis   LOS: 2 days    Adin Hector 11/12/2019

## 2019-11-13 ENCOUNTER — Encounter: Payer: Self-pay | Admitting: Internal Medicine

## 2019-11-13 ENCOUNTER — Telehealth: Payer: Self-pay | Admitting: Internal Medicine

## 2019-11-13 DIAGNOSIS — K5792 Diverticulitis of intestine, part unspecified, without perforation or abscess without bleeding: Secondary | ICD-10-CM

## 2019-11-13 LAB — CBC
HCT: 38.1 % — ABNORMAL LOW (ref 39.0–52.0)
Hemoglobin: 12.9 g/dL — ABNORMAL LOW (ref 13.0–17.0)
MCH: 30.1 pg (ref 26.0–34.0)
MCHC: 33.9 g/dL (ref 30.0–36.0)
MCV: 88.8 fL (ref 80.0–100.0)
Platelets: 257 10*3/uL (ref 150–400)
RBC: 4.29 MIL/uL (ref 4.22–5.81)
RDW: 12.8 % (ref 11.5–15.5)
WBC: 7.5 10*3/uL (ref 4.0–10.5)
nRBC: 0 % (ref 0.0–0.2)

## 2019-11-13 LAB — BASIC METABOLIC PANEL
Anion gap: 12 (ref 5–15)
BUN: 7 mg/dL — ABNORMAL LOW (ref 8–23)
CO2: 25 mmol/L (ref 22–32)
Calcium: 8.6 mg/dL — ABNORMAL LOW (ref 8.9–10.3)
Chloride: 98 mmol/L (ref 98–111)
Creatinine, Ser: 0.89 mg/dL (ref 0.61–1.24)
GFR calc Af Amer: >60 mL/min (ref >60)
GFR calc non Af Amer: >60 mL/min (ref >60)
Glucose, Bld: 86 mg/dL (ref 70–99)
Potassium: 3.6 mmol/L (ref 3.5–5.1)
Sodium: 135 mmol/L (ref 135–145)

## 2019-11-13 MED ORDER — AMOXICILLIN-POT CLAVULANATE 875-125 MG PO TABS
1.0000 | ORAL_TABLET | Freq: Two times a day (BID) | ORAL | Status: DC
  Administered 2019-11-13 – 2019-11-14 (×3): 1 via ORAL
  Filled 2019-11-13 (×3): qty 1

## 2019-11-13 NOTE — Progress Notes (Signed)
  Subjective: Overall improving.  Afebrile.  Vital signs stable. Abdominal pain significantly improved.  Tolerating full liquids.  Wants to advance diet Diarrhea less with Imodium C. difficile screen negative.  GI panel pending. Ambulating in hall several times a day.  Lumbosacral back pain persist but no symptoms of radiculopathy.  Scheduled to see Dr. Wilhemina Bonito next Tuesday as outpatient WBC now normal at 7500.  Hemoglobin 12.9.  Stable.  Electrolytes normal.  Objective: Vital signs in last 24 hours: Temp:  [98.8 F (37.1 C)-99.1 F (37.3 C)] 98.8 F (37.1 C) (12/23 0600) Pulse Rate:  [70-81] 72 (12/23 0600) Resp:  [18-19] 19 (12/23 0600) BP: (111-126)/(67-82) 111/79 (12/23 0600) SpO2:  [98 %-100 %] 98 % (12/23 0600) Last BM Date: 11/12/19  Intake/Output from previous day: 12/22 0701 - 12/23 0700 In: -  Out: 904 [Urine:900; Stool:4] Intake/Output this shift: No intake/output data recorded.    EXAM: General appearance:Alert and cooperative. Does not appear uncomfortable. Mental status normal.. Much more relaxed and pleasant. Resp:clear to auscultation bilaterally NW:GNFA.  bowel sounds very active, almost hyperactive..  Lower abdominal tenderness minimal. Extremities:extremities normal, atraumatic, no cyanosis or edema   Lab Results:  Recent Labs    11/12/19 0232 11/13/19 0156  WBC 11.8* 7.5  HGB 12.8* 12.9*  HCT 37.7* 38.1*  PLT 221 257   BMET Recent Labs    11/12/19 0232 11/13/19 0156  NA 135 135  K 3.7 3.6  CL 98 98  CO2 26 25  GLUCOSE 100* 86  BUN 10 7*  CREATININE 1.12 0.89  CALCIUM 8.7* 8.6*   PT/INR Recent Labs    11/10/19 1249  LABPROT 14.4  INR 1.1   ABG No results for input(s): PHART, HCO3 in the last 72 hours.  Invalid input(s): PCO2, PO2  Studies/Results: No results found.  Anti-infectives: Anti-infectives (From admission, onward)   Start     Dose/Rate Route Frequency Ordered Stop   11/10/19 2200   piperacillin-tazobactam (ZOSYN) IVPB 3.375 g     3.375 g 12.5 mL/hr over 240 Minutes Intravenous Every 8 hours 11/10/19 1642     11/10/19 1530  piperacillin-tazobactam (ZOSYN) IVPB 3.375 g     3.375 g 100 mL/hr over 30 Minutes Intravenous  Once 11/10/19 1527 11/10/19 1625      Assessment/Plan:    Acute sigmoid diverticulitis.Microperforation but no evidence of obstruction or bleeding. No indication for acute surgical intervention Explained algorithms of care to patient Advance to soft diet Switch from Zosyn to Augmentin Probable discharge home tomorrow Will arrange follow-up with one of our colorectal surgeons in about 2 weeks We will arrange referral back to Dr. Cristina Gong for colonoscopy in 6 to 8 weeks  Leukocytosis.    Resolved  Diarrhea.    Improved with Imodium.  C. difficile negative.  GI panel pending.  Hopefully this will abate and be a self-limited process.  Lumbosacral back pain. Ambulate. Ask NS to see if worsens.  Has appointment with Dr. Wilhemina Bonito next Tuesday   LOS: 3 days    Adin Hector 11/13/2019

## 2019-11-13 NOTE — Progress Notes (Signed)
PROGRESS NOTE    Paul [name]  ZOX:[id number] DOB: 1951-06-21 DOA: 11/10/2019 PCP: Margaree Mackintosh, MD   Brief Narrative:   68 year old gentleman prior history of hepatitis C, left total knee replacement.,  Anxiety, depression presents to ED with complaints of fever, nausea vomiting and abdominal pain.  CT abdomen and pelvis shows mild diverticulitis with microperforation.  General surgery consulted and recommendations given. Patient seen today reports diarrhea has improved.  General surgery advance his diet from liquid to soft diet today and if he is able to tolerate soft diet, plan for discharge in the morning.   Assessment & Plan:   Principal Problem:   Diverticulitis of intestine with perforation Active Problems:   Depression with anxiety   Marijuana abuse   Alcohol abuse    Sepsis secondary to acute diverticulitis with microperforation Improving. He was started on Zosyn plan to transition him to oral antibiotics on discharge General surgery consulted and appreciate recommendations.  Plan for discharge tomorrow if he is able to tolerate soft diet. Follow blood cultures. Improving diarrhea.  GI pathogen PCR is pending.  C. difficile PCR is negative.   Depression/anxiety Continue with Cymbalta.    History of alcohol use Continue with CIWA protocol, no signs of withdrawal at this time.      DVT prophylaxis: (Lovenox Code Status: Full code Family Communication: None at bedside Disposition Plan: Possible discharge tomorrow  Consultants:   General surgery  Procedures: None Antimicrobials: Zosyn to Augmentin  Subjective: Patient reports no nausea or vomiting, diarrhea is improving but has not resolved.  Abdominal pain has improved.  Objective: Vitals:   11/12/19 1636 11/12/19 2216 11/13/19 0600 11/13/19 1409  BP: 126/82 119/67 111/79 113/62  Pulse: 81 70 72 81  Resp: 18 18 19    Temp: 99.1 F (37.3 C) 98.9 F (37.2 C) 98.8 F (37.1 C) 98.9 F (37.2 C)   TempSrc: Oral     SpO2: 100% 99% 98% 98%    Intake/Output Summary (Last 24 hours) at 11/13/2019 1516 Last data filed at 11/12/2019 1638 Gross per 24 hour  Intake --  Output 202 ml  Net -202 ml   There were no vitals filed for this visit.  Examination:  General exam: Appears calm and comfortable  Respiratory system: Clear to auscultation. Respiratory effort normal. Cardiovascular system: S1 & S2 heard, RRR. No JVD, murmurs No pedal edema. Gastrointestinal system: Abdomen is SOFT , discomfort in the lower abdomen but no tenderness.  Central nervous system: Alert and oriented. No focal neurological deficits. Extremities: Symmetric 5 x 5 power. Skin: No rashes, lesions or ulcers Psychiatry:  Mood & affect appropriate.     Data Reviewed: I have personally reviewed following labs and imaging studies  CBC: Recent Labs  Lab 11/10/19 1249 11/11/19 0427 11/11/19 0723 11/12/19 0232 11/13/19 0156  WBC 17.8* 17.3* 15.9* 11.8* 7.5  NEUTROABS 16.4*  --   --   --   --   HGB 15.3 13.5 14.0 12.8* 12.9*  HCT 44.2 39.8 41.4 37.7* 38.1*  MCV 88.9 90.0 91.0 90.4 88.8  PLT 244 230 198 221 257   Basic Metabolic Panel: Recent Labs  Lab 11/10/19 1249 11/11/19 0427 11/11/19 0723 11/12/19 0232 11/13/19 0156  NA 133* 133*  --  135 135  K 3.6 3.9  --  3.7 3.6  CL 98 100  --  98 98  CO2 23 25  --  26 25  GLUCOSE 121* 114*  --  100* 86  BUN 19 14  --  10 7*  CREATININE 0.99 1.11 1.03 1.12 0.89  CALCIUM 9.2 8.5*  --  8.7* 8.6*  MG  --  1.9  --   --   --   PHOS  --  2.9  --   --   --    GFR: CrCl cannot be calculated (Unknown ideal weight.). Liver Function Tests: Recent Labs  Lab 11/10/19 1249 11/11/19 0427  AST 13* 11*  ALT 13 11  ALKPHOS 68 68  BILITOT 1.2 1.1  PROT 6.9 5.5*  ALBUMIN 3.5 2.9*   No results for input(s): LIPASE, AMYLASE in the last 168 hours. No results for input(s): AMMONIA in the last 168 hours. Coagulation Profile: Recent Labs  Lab 11/10/19 1249   INR 1.1   Cardiac Enzymes: No results for input(s): CKTOTAL, CKMB, CKMBINDEX, TROPONINI in the last 168 hours. BNP (last 3 results) No results for input(s): PROBNP in the last 8760 hours. HbA1C: Recent Labs    11/11/19 0723  HGBA1C 4.7*   CBG: No results for input(s): GLUCAP in the last 168 hours. Lipid Profile: No results for input(s): CHOL, HDL, LDLCALC, TRIG, CHOLHDL, LDLDIRECT in the last 72 hours. Thyroid Function Tests: No results for input(s): TSH, T4TOTAL, FREET4, T3FREE, THYROIDAB in the last 72 hours. Anemia Panel: No results for input(s): VITAMINB12, FOLATE, FERRITIN, TIBC, IRON, RETICCTPCT in the last 72 hours. Sepsis Labs: Recent Labs  Lab 11/10/19 1249 11/10/19 1547  LATICACIDVEN 1.7 1.6    Recent Results (from the past 240 hour(s))  Blood Culture (routine x 2)     Status: None (Preliminary result)   Collection Time: 11/10/19 12:57 PM   Specimen: BLOOD  Result Value Ref Range Status   Specimen Description BLOOD RIGHT ANTECUBITAL  Final   Special Requests   Final    BOTTLES DRAWN AEROBIC AND ANAEROBIC Blood Culture results may not be optimal due to an excessive volume of blood received in culture bottles   Culture   Final    NO GROWTH 3 DAYS Performed at [facility], [address] [place], [place] [address]    Report Status PENDING  Incomplete  Blood Culture (routine x 2)     Status: None (Preliminary result)   Collection Time: 11/10/19 12:58 PM   Specimen: BLOOD RIGHT HAND  Result Value Ref Range Status   Specimen Description BLOOD RIGHT HAND  Final   Special Requests   Final    BOTTLES DRAWN AEROBIC AND ANAEROBIC Blood Culture results may not be optimal due to an excessive volume of blood received in culture bottles   Culture   Final    NO GROWTH 3 DAYS Performed at [facility], [address] [place], [place] [address]    Report Status PENDING  Incomplete  SARS CORONAVIRUS 2 (TAT 6-24 HRS) Nasopharyngeal Nasopharyngeal Swab      Status: None   Collection Time: 11/10/19  2:04 PM   Specimen: Nasopharyngeal Swab  Result Value Ref Range Status   SARS Coronavirus 2 NEGATIVE NEGATIVE Final    Comment: (NOTE) SARS-CoV-2 target nucleic acids are NOT DETECTED. The SARS-CoV-2 RNA is generally detectable in upper and lower respiratory specimens during the acute phase of infection. Negative results do not preclude SARS-CoV-2 infection, do not rule out co-infections with other pathogens, and should not be used as the sole basis for treatment or other patient management decisions. Negative results must be combined with clinical observations, patient history, and epidemiological information. The expected result is Negative. Fact Sheet for  Patients: HairSlick.no Fact Sheet for Healthcare Providers: quierodirigir.com This test is not yet approved or cleared by the [place] FDA and  has been authorized for detection and/or diagnosis of SARS-CoV-2 by FDA under an Emergency Use Authorization (EUA). This EUA will remain  in effect (meaning this test can be used) for the duration of the COVID-19 declaration under Section 56 4(b)(1) of the Act, 21 U.S.C. section 360bbb-3(b)(1), unless the authorization is terminated or revoked sooner. Performed at [facility], [address] [place], [place] [address]   Urine culture     Status: None   Collection Time: 11/10/19  2:06 PM   Specimen: In/Out Cath Urine  Result Value Ref Range Status   Specimen Description IN/OUT CATH URINE  Final   Special Requests NONE  Final   Culture   Final    NO GROWTH Performed at [facility], [address] [place], [place] [address]    Report Status 11/11/2019 FINAL  Final  C Difficile Quick Screen w PCR reflex     Status: None   Collection Time: 11/12/19  8:57 AM   Specimen: Stool  Result Value Ref Range Status   C Diff antigen NEGATIVE NEGATIVE Final   C Diff toxin  NEGATIVE NEGATIVE Final   C Diff interpretation No C. difficile detected.  Final    Comment: Performed at [facility], [address], [place], [place] [address]         Radiology Studies: No results found.      Scheduled Meds: . amoxicillin-clavulanate  1 tablet Oral Q12H  . DULoxetine  60 mg Oral Daily  . enoxaparin (LOVENOX) injection  40 mg Subcutaneous Q24H  . folic acid  1 mg Oral Daily  . multivitamin with minerals  1 tablet Oral Daily  . thiamine  100 mg Oral Daily   Continuous Infusions: . lactated ringers 75 mL/hr at 11/13/19 0509     LOS: 3 days       Kathlen Mody, MD [facility]  11/13/2019, 3:16 PM

## 2019-11-13 NOTE — Telephone Encounter (Signed)
Called patient to check on him- hospitalized with diverticulitis and microperforation. WBC has improved. He says he may be discharged tomorrow. Says he is feeling better.

## 2019-11-14 LAB — BASIC METABOLIC PANEL
Anion gap: 13 (ref 5–15)
BUN: 7 mg/dL — ABNORMAL LOW (ref 8–23)
CO2: 28 mmol/L (ref 22–32)
Calcium: 9 mg/dL (ref 8.9–10.3)
Chloride: 99 mmol/L (ref 98–111)
Creatinine, Ser: 0.9 mg/dL (ref 0.61–1.24)
GFR calc Af Amer: >60 mL/min (ref >60)
GFR calc non Af Amer: >60 mL/min (ref >60)
Glucose, Bld: 92 mg/dL (ref 70–99)
Potassium: 4.4 mmol/L (ref 3.5–5.1)
Sodium: 140 mmol/L (ref 135–145)

## 2019-11-14 MED ORDER — THIAMINE HCL 100 MG PO TABS: 100.0000 mg | ORAL_TABLET | Freq: Every day | ORAL | Status: DC

## 2019-11-14 MED ORDER — FOLIC ACID 1 MG PO TABS: 1.0000 mg | ORAL_TABLET | Freq: Every day | ORAL | 1 refills | Status: DC

## 2019-11-14 MED ORDER — LOPERAMIDE HCL 1 MG/7.5ML PO SUSP: 2.0000 mg | ORAL | 0 refills | Status: DC | PRN

## 2019-11-14 MED ORDER — ALPRAZOLAM 0.5 MG PO TABS: 0.5000 mg | ORAL_TABLET | Freq: Two times a day (BID) | ORAL | Status: DC | PRN

## 2019-11-14 MED ORDER — ADULT MULTIVITAMIN W/MINERALS CH: 1.0000 | ORAL_TABLET | Freq: Every day | ORAL | Status: DC

## 2019-11-14 MED ORDER — AMOXICILLIN-POT CLAVULANATE 875-125 MG PO TABS: 1.0000 | ORAL_TABLET | Freq: Two times a day (BID) | ORAL | 0 refills | Status: AC

## 2019-11-14 NOTE — Progress Notes (Signed)
   Subjective/Chief Complaint: PT doing well with no pain Tol PO    Objective: Vital signs in last 24 hours: Temp:  [98.8 F (37.1 C)-98.9 F (37.2 C)] 98.8 F (37.1 C) (12/24 0701) Pulse Rate:  [66-81] 66 (12/24 0701) Resp:  [17-18] 17 (12/24 0701) BP: (99-125)/(62-75) 125/72 (12/24 0701) SpO2:  [98 %] 98 % (12/24 0701) Last BM Date: 11/13/19  Intake/Output from previous day: 12/23 0701 - 12/24 0700 In: 1140 [P.O.:240; I.V.:900] Out: -  Intake/Output this shift: No intake/output data recorded.  Constitutional: No acute distress, conversant, appears states age. Eyes: Anicteric sclerae, moist conjunctiva, no lid lag Lungs: Clear to auscultation bilaterally, normal respiratory effort CV: regular rate and rhythm, no murmurs, no peripheral edema, pedal pulses 2+ GI: Soft, no masses or hepatosplenomegaly, non-tender to palpation Skin: No rashes, palpation reveals normal turgor Psychiatric: appropriate judgment and insight, oriented to person, place, and time   Lab Results:  Recent Labs    11/12/19 0232 11/13/19 0156  WBC 11.8* 7.5  HGB 12.8* 12.9*  HCT 37.7* 38.1*  PLT 221 257   BMET Recent Labs    11/13/19 0156 11/14/19 0439  NA 135 140  K 3.6 4.4  CL 98 99  CO2 25 28  GLUCOSE 86 92  BUN 7* 7*  CREATININE 0.89 0.90  CALCIUM 8.6* 9.0   PT/INR No results for input(s): LABPROT, INR in the last 72 hours. ABG No results for input(s): PHART, HCO3 in the last 72 hours.  Invalid input(s): PCO2, PO2  Studies/Results: No results found.  Anti-infectives: Anti-infectives (From admission, onward)   Start     Dose/Rate Route Frequency Ordered Stop   11/13/19 1200  amoxicillin-clavulanate (AUGMENTIN) 875-125 MG per tablet 1 tablet     1 tablet Oral Every 12 hours 11/13/19 0632     11/10/19 2200  piperacillin-tazobactam (ZOSYN) IVPB 3.375 g  Status:  Discontinued     3.375 g 12.5 mL/hr over 240 Minutes Intravenous Every 8 hours 11/10/19 1642 11/13/19 0632   11/10/19 1530  piperacillin-tazobactam (ZOSYN) IVPB 3.375 g     3.375 g 100 mL/hr over 30 Minutes Intravenous  Once 11/10/19 1527 11/10/19 1625      Assessment/Plan: Acute sigmoid diverticulitis.Microperforation but no evidence of obstruction or bleeding. No indication for acute surgical intervention  [name] to adv diet as tol Will arrange follow-up with one of our colorectal surgeons in about 2 weeks We will arrange referral back to Dr. Cristina Gong for colonoscopy in 6 to 8 weeks  Leukocytosis.  Resolved  Diarrhea.  Improved with Imodium.  C. difficile negative.  GI panel pending.  Hopefully this will abate and be a self-limited process.  Lumbosacral back pain. Ambulate. Ask NS to see if worsens.  Has appointment with Dr. Wilhemina Bonito next Tuesday  -[place] for home with abx today   LOS: 4 days    Paul [name] 11/14/2019

## 2019-11-14 NOTE — Progress Notes (Signed)
Patient discharged to home. Verbalized understanding of all discharge instructions including diet, discharge medications and follow up MD visits.

## 2019-11-15 LAB — CULTURE, BLOOD (ROUTINE X 2)
Culture: NO GROWTH
Culture: NO GROWTH

## 2019-11-15 NOTE — Discharge Summary (Signed)
Physician Discharge Summary  Paul [name] ZOX:[id number] DOB: February 13, 1951 DOA: 11/10/2019  PCP: Margaree Mackintosh, MD  Admit date: 11/10/2019 Discharge date: 11/14/2019  Admitted From: Home.  Disposition:  Home.   Recommendations for Outpatient Follow-up:  1. Follow up with PCP in 1-2 weeks 2. Please obtain BMP/CBC in one week Please follow up with surgery as needed.   Discharge Condition:stable.  CODE STATUS: full code.  Diet recommendation: Heart Healthy     Brief/Interim Summary: 68 year old gentleman prior history of hepatitis C, left total knee replacement.,  Anxiety, depression presents to ED with complaints of fever, nausea vomiting and abdominal pain.  CT abdomen and pelvis shows mild diverticulitis with microperforation.  General surgery consulted and recommendations given. Patient seen today reports diarrhea has improved.  General surgery advance his diet from liquid to soft diet and he was able to tolerate without any issues.   Discharge Diagnoses:  Principal Problem:   Diverticulitis of intestine with perforation Active Problems:   Depression with anxiety   Marijuana abuse   Alcohol abuse  Sepsis secondary to acute diverticulitis with microperforation Resolved.  He was started on Zosyn plan to transition him to oral antibiotics on discharge General surgery consulted and appreciate recommendations.   Negative blood cultures.  Improving diarrhea.  GI pathogen PCR is pending, recommend outpatient follow up.  C. difficile PCR is negative.   Depression/anxiety Continue with Cymbalta.    History of alcohol use  no signs of withdrawal at this time.  Discharge Instructions  Discharge Instructions    Diet - low sodium heart healthy   Complete by: As directed    Discharge instructions   Complete by: As directed    Please follow up with General surgery and Gastroenterology as recommended.   Increase activity slowly   Complete by: As directed       Allergies as of 11/14/2019   No Known Allergies     Medication List    TAKE these medications   ALPRAZolam 0.5 MG tablet Commonly known as: Xanax Take 1 tablet (0.5 mg total) by mouth 2 (two) times daily as needed for anxiety.   amoxicillin-clavulanate 875-125 MG tablet Commonly known as: AUGMENTIN Take 1 tablet by mouth every 12 (twelve) hours for 10 days.   DULoxetine 60 MG capsule Commonly known as: CYMBALTA TAKE 1 CAPSULE BY MOUTH EVERY DAY   folic acid 1 MG tablet Commonly known as: FOLVITE Take 1 tablet (1 mg total) by mouth daily.   loperamide HCl 1 MG/7.5ML suspension Commonly known as: IMODIUM Take 15 mLs (2 mg total) by mouth as needed for diarrhea or loose stools.   multivitamin with minerals Tabs tablet Take 1 tablet by mouth daily.   thiamine 100 MG tablet Take 1 tablet (100 mg total) by mouth daily.      Follow-up Information    Bernette Redbird, MD Follow up.   Specialty: Gastroenterology Why: Schedule an appointment for colonoscopy in 6-8 weeks.  Contact information: [address] [place] [address] [phone]        Surgery, May. Call.   Specialty: General Surgery Why: Call and schedule an appointment to see a colorectal surgeon after colonoscopy.  Contact information: [address] [place] [address] [phone]        Margaree Mackintosh, MD. Schedule an appointment as soon as possible for a visit in 1 week(s).   Specialty: Internal Medicine Contact information: [phone] [place] [address] [phone]  No Known Allergies  Consultations:  General surgery.    Procedures/Studies: CT ABDOMEN PELVIS W CONTRAST  Result Date: 11/10/2019 CLINICAL DATA:  Lower abdominal pain, fever, nausea, and diarrhea beginning last night. EXAM: CT ABDOMEN AND PELVIS WITH CONTRAST TECHNIQUE: Multidetector CT imaging of the abdomen and pelvis was performed using the  standard protocol following bolus administration of intravenous contrast. CONTRAST:  OMNIPAQUE IOHEXOL 300 MG/ML  SOLN COMPARISON:  None. FINDINGS: Lower Chest: No acute findings. Hepatobiliary: No hepatic masses identified. Gallbladder is unremarkable. No evidence of biliary ductal dilatation. Pancreas:  No mass or inflammatory changes. Spleen: Within normal limits in size and appearance. Adrenals/Urinary Tract: No masses identified. No evidence of hydronephrosis. Stomach/Bowel: Mild sigmoid diverticulitis is seen with a few tiny extraluminal air bubbles consistent with micro perforation. No evidence of free intraperitoneal air. No evidence of abscess or ascites. Tiny hiatal hernia noted. Vascular/Lymphatic: No pathologically enlarged lymph nodes. No abdominal aortic aneurysm. Aortic atherosclerosis incidentally noted. Reproductive:  No mass or other significant abnormality. Other:  None. Musculoskeletal:  No suspicious bone lesions identified. IMPRESSION: Mild sigmoid diverticulitis, with microperforation. No evidence of free intraperitoneal air or abscess. Aortic Atherosclerosis (ICD10-I70.0). Electronically Signed   By: Danae Orleans M.D.   On: 11/10/2019 15:05   DG Chest Port 1 View  Result Date: 11/10/2019 CLINICAL DATA:  Fever. EXAM: PORTABLE CHEST 1 VIEW COMPARISON:  None. FINDINGS: The heart size and mediastinal contours are within normal limits. Aortic atherosclerosis incidentally noted. Both lungs are clear. The visualized skeletal structures are unremarkable. IMPRESSION: No active disease. Electronically Signed   By: Danae Orleans M.D.   On: 11/10/2019 13:05       Subjective: No chest pain or sob. No nausea, vomiting, diarrhea improved.   Discharge Exam: Vitals:   11/13/19 2215 11/14/19 0701  BP: 99/75 125/72  Pulse: 75 66  Resp: 18 17  Temp: 98.8 F (37.1 C) 98.8 F (37.1 C)  SpO2: 98% 98%   Vitals:   11/13/19 0600 11/13/19 1409 11/13/19 2215 11/14/19 0701  BP: 111/79  113/62 99/75 125/72  Pulse: 72 81 75 66  Resp: 19  18 17   Temp: 98.8 F (37.1 C) 98.9 F (37.2 C) 98.8 F (37.1 C) 98.8 F (37.1 C)  TempSrc:      SpO2: 98% 98% 98% 98%    General: Pt is alert, awake, not in acute distress Cardiovascular: RRR, S1/S2 +, no rubs, no gallops Respiratory: CTA bilaterally, no wheezing, no rhonchi Abdominal: Soft, NT, ND, bowel sounds + Extremities: no edema, no cyanosis    The results of significant diagnostics from this hospitalization (including imaging, microbiology, ancillary and laboratory) are listed below for reference.     Microbiology: Recent Results (from the past 240 hour(s))  Blood Culture (routine x 2)     Status: None   Collection Time: 11/10/19 12:57 PM   Specimen: BLOOD  Result Value Ref Range Status   Specimen Description BLOOD RIGHT ANTECUBITAL  Final   Special Requests   Final    BOTTLES DRAWN AEROBIC AND ANAEROBIC Blood Culture results may not be optimal due to an excessive volume of blood received in culture bottles   Culture   Final    NO GROWTH 5 DAYS Performed at [facility], [address], [place], [place] [address]    Report Status 11/15/2019 FINAL  Final  Blood Culture (routine x 2)     Status: None   Collection Time: 11/10/19 12:58 PM   Specimen: BLOOD RIGHT  HAND  Result Value Ref Range Status   Specimen Description BLOOD RIGHT HAND  Final   Special Requests   Final    BOTTLES DRAWN AEROBIC AND ANAEROBIC Blood Culture results may not be optimal due to an excessive volume of blood received in culture bottles   Culture   Final    NO GROWTH 5 DAYS Performed at [facility], [address] [place], [place] [address]    Report Status 11/15/2019 FINAL  Final  SARS CORONAVIRUS 2 (TAT 6-24 HRS) Nasopharyngeal Nasopharyngeal Swab     Status: None   Collection Time: 11/10/19  2:04 PM   Specimen: Nasopharyngeal Swab  Result Value Ref Range Status   SARS Coronavirus 2 NEGATIVE NEGATIVE Final    Comment:  (NOTE) SARS-CoV-2 target nucleic acids are NOT DETECTED. The SARS-CoV-2 RNA is generally detectable in upper and lower respiratory specimens during the acute phase of infection. Negative results do not preclude SARS-CoV-2 infection, do not rule out co-infections with other pathogens, and should not be used as the sole basis for treatment or other patient management decisions. Negative results must be combined with clinical observations, patient history, and epidemiological information. The expected result is Negative. Fact Sheet for Patients: HairSlick.no Fact Sheet for Healthcare Providers: quierodirigir.com This test is not yet approved or cleared by the [place] FDA and  has been authorized for detection and/or diagnosis of SARS-CoV-2 by FDA under an Emergency Use Authorization (EUA). This EUA will remain  in effect (meaning this test can be used) for the duration of the COVID-19 declaration under Section 56 4(b)(1) of the Act, 21 U.S.C. section 360bbb-3(b)(1), unless the authorization is terminated or revoked sooner. Performed at [facility], [address], [place], [place] [address]   Urine culture     Status: None   Collection Time: 11/10/19  2:06 PM   Specimen: In/Out Cath Urine  Result Value Ref Range Status   Specimen Description IN/OUT CATH URINE  Final   Special Requests NONE  Final   Culture   Final    NO GROWTH Performed at [facility], [address], [place], [place] [address]    Report Status 11/11/2019 FINAL  Final  C Difficile Quick Screen w PCR reflex     Status: None   Collection Time: 11/12/19  8:57 AM   Specimen: Stool  Result Value Ref Range Status   C Diff antigen NEGATIVE NEGATIVE Final   C Diff toxin NEGATIVE NEGATIVE Final   C Diff interpretation No C. difficile detected.  Final    Comment: Performed at [facility], [address], [place], [place] [address]      Labs: BNP (last 3 results) No results for input(s): BNP in the last 8760 hours. Basic Metabolic Panel: Recent Labs  Lab 11/10/19 1249 11/11/19 0427 11/11/19 0723 11/12/19 0232 11/13/19 0156 11/14/19 0439  NA 133* 133*  --  135 135 140  K 3.6 3.9  --  3.7 3.6 4.4  CL 98 100  --  98 98 99  CO2 23 25  --  26 25 28   GLUCOSE 121* 114*  --  100* 86 92  BUN 19 14  --  10 7* 7*  CREATININE 0.99 1.11 1.03 1.12 0.89 0.90  CALCIUM 9.2 8.5*  --  8.7* 8.6* 9.0  MG  --  1.9  --   --   --   --   PHOS  --  2.9  --   --   --   --  Liver Function Tests: Recent Labs  Lab 11/10/19 1249 11/11/19 0427  AST 13* 11*  ALT 13 11  ALKPHOS 68 68  BILITOT 1.2 1.1  PROT 6.9 5.5*  ALBUMIN 3.5 2.9*   No results for input(s): LIPASE, AMYLASE in the last 168 hours. No results for input(s): AMMONIA in the last 168 hours. CBC: Recent Labs  Lab 11/10/19 1249 11/11/19 0427 11/11/19 0723 11/12/19 0232 11/13/19 0156  WBC 17.8* 17.3* 15.9* 11.8* 7.5  NEUTROABS 16.4*  --   --   --   --   HGB 15.3 13.5 14.0 12.8* 12.9*  HCT 44.2 39.8 41.4 37.7* 38.1*  MCV 88.9 90.0 91.0 90.4 88.8  PLT 244 230 198 221 257   Cardiac Enzymes: No results for input(s): CKTOTAL, CKMB, CKMBINDEX, TROPONINI in the last 168 hours. BNP: Invalid input(s): POCBNP CBG: No results for input(s): GLUCAP in the last 168 hours. D-Dimer No results for input(s): DDIMER in the last 72 hours. Hgb A1c No results for input(s): HGBA1C in the last 72 hours. Lipid Profile No results for input(s): CHOL, HDL, LDLCALC, TRIG, CHOLHDL, LDLDIRECT in the last 72 hours. Thyroid function studies No results for input(s): TSH, T4TOTAL, T3FREE, THYROIDAB in the last 72 hours.  Invalid input(s): FREET3 Anemia work up No results for input(s): VITAMINB12, FOLATE, FERRITIN, TIBC, IRON, RETICCTPCT in the last 72 hours. Urinalysis    Component Value Date/Time   COLORURINE AMBER (A) 11/10/2019 1406   APPEARANCEUR CLEAR 11/10/2019 1406    LABSPEC 1.032 (H) 11/10/2019 1406   PHURINE 6.0 11/10/2019 1406   GLUCOSEU NEGATIVE 11/10/2019 1406   HGBUR NEGATIVE 11/10/2019 1406   BILIRUBINUR NEGATIVE 11/10/2019 1406   BILIRUBINUR NEG 10/07/2019 1529   KETONESUR NEGATIVE 11/10/2019 1406   PROTEINUR 100 (A) 11/10/2019 1406   UROBILINOGEN 0.2 10/07/2019 1529   NITRITE NEGATIVE 11/10/2019 1406   LEUKOCYTESUR NEGATIVE 11/10/2019 1406   Sepsis Labs Invalid input(s): PROCALCITONIN,  WBC,  LACTICIDVEN Microbiology Recent Results (from the past 240 hour(s))  Blood Culture (routine x 2)     Status: None   Collection Time: 11/10/19 12:57 PM   Specimen: BLOOD  Result Value Ref Range Status   Specimen Description BLOOD RIGHT ANTECUBITAL  Final   Special Requests   Final    BOTTLES DRAWN AEROBIC AND ANAEROBIC Blood Culture results may not be optimal due to an excessive volume of blood received in culture bottles   Culture   Final    NO GROWTH 5 DAYS Performed at [facility], [address], [place], [place] [address]    Report Status 11/15/2019 FINAL  Final  Blood Culture (routine x 2)     Status: None   Collection Time: 11/10/19 12:58 PM   Specimen: BLOOD RIGHT HAND  Result Value Ref Range Status   Specimen Description BLOOD RIGHT HAND  Final   Special Requests   Final    BOTTLES DRAWN AEROBIC AND ANAEROBIC Blood Culture results may not be optimal due to an excessive volume of blood received in culture bottles   Culture   Final    NO GROWTH 5 DAYS Performed at [facility], [address], [place], [place] [address]    Report Status 11/15/2019 FINAL  Final  SARS CORONAVIRUS 2 (TAT 6-24 HRS) Nasopharyngeal Nasopharyngeal Swab     Status: None   Collection Time: 11/10/19  2:04 PM   Specimen: Nasopharyngeal Swab  Result Value Ref Range Status   SARS Coronavirus 2 NEGATIVE NEGATIVE Final  Comment: (NOTE) SARS-CoV-2 target nucleic acids are NOT DETECTED. The SARS-CoV-2 RNA is generally detectable in upper and  lower respiratory specimens during the acute phase of infection. Negative results do not preclude SARS-CoV-2 infection, do not rule out co-infections with other pathogens, and should not be used as the sole basis for treatment or other patient management decisions. Negative results must be combined with clinical observations, patient history, and epidemiological information. The expected result is Negative. Fact Sheet for Patients: HairSlick.no Fact Sheet for Healthcare Providers: quierodirigir.com This test is not yet approved or cleared by the [place] FDA and  has been authorized for detection and/or diagnosis of SARS-CoV-2 by FDA under an Emergency Use Authorization (EUA). This EUA will remain  in effect (meaning this test can be used) for the duration of the COVID-19 declaration under Section 56 4(b)(1) of the Act, 21 U.S.C. section 360bbb-3(b)(1), unless the authorization is terminated or revoked sooner. Performed at [facility], [address] [place], [place] [address]   Urine culture     Status: None   Collection Time: 11/10/19  2:06 PM   Specimen: In/Out Cath Urine  Result Value Ref Range Status   Specimen Description IN/OUT CATH URINE  Final   Special Requests NONE  Final   Culture   Final    NO GROWTH Performed at [facility], [address], [place], [place] [address]    Report Status 11/11/2019 FINAL  Final  C Difficile Quick Screen w PCR reflex     Status: None   Collection Time: 11/12/19  8:57 AM   Specimen: Stool  Result Value Ref Range Status   C Diff antigen NEGATIVE NEGATIVE Final   C Diff toxin NEGATIVE NEGATIVE Final   C Diff interpretation No C. difficile detected.  Final    Comment: Performed at [facility], [address] [place], [place] [address]     Time coordinating discharge: 32 minutes  SIGNED:   Kathlen Mody, MD  [facility]

## 2019-11-16 LAB — GI PATHOGEN PANEL BY PCR, STOOL

## 2019-11-19 ENCOUNTER — Telehealth: Payer: Self-pay

## 2019-11-19 NOTE — Telephone Encounter (Addendum)
Patient was d/c over the holiday weekend.  Called patient to go over his medications, patient stated only new medication is amoxicillin-clavulanate (AUGMENTIN) 875-125 MG tablet and he is taking it every 12 hours. Patient states he is starting to feel a little bit better, We scheduled a hospital follow for next week in office.

## 2019-11-26 ENCOUNTER — Telehealth (INDEPENDENT_AMBULATORY_CARE_PROVIDER_SITE_OTHER): Payer: Commercial Managed Care - PPO | Admitting: Internal Medicine

## 2019-11-26 ENCOUNTER — Encounter: Payer: Self-pay | Admitting: Internal Medicine

## 2019-11-26 DIAGNOSIS — K5792 Diverticulitis of intestine, part unspecified, without perforation or abscess without bleeding: Secondary | ICD-10-CM | POA: Diagnosis not present

## 2019-11-26 MED ORDER — METRONIDAZOLE 500 MG PO TABS: 500.0000 mg | ORAL_TABLET | Freq: Three times a day (TID) | ORAL | 0 refills | Status: DC

## 2019-11-26 MED ORDER — CIPROFLOXACIN HCL 500 MG PO TABS: 500.0000 mg | ORAL_TABLET | Freq: Two times a day (BID) | ORAL | 0 refills | Status: DC

## 2019-11-26 NOTE — Telephone Encounter (Signed)
Spoke with patient by phone. He is at work. Has appt later in the week. Low grade fever around 100 degrees. No shaking chills.Some malaise. No severe abdominal pain but has diarrhea when could be aggravted by Augmentin po. Recommend soft diet. Does not feel ill enough to return to hospital. Asked about atherosclerosis on Abd CT. This is a common finding. Can discuss later. Total cholesterol has improved from 232 to 200 and LDL improved from 150 to 112.  He has appt day after tomorrow. I suggest we switch Augmentin to Cipro and Flagyl and follow up day after tomorrow. Colonoscopy was suggested to him in 6 weeks at hospital and surgeon is to see him around the same time.

## 2019-11-26 NOTE — Telephone Encounter (Signed)
Paul [name] [phone]  [name] called to say he has diarrhea, fever and has run out of his antibiotic. He has hospital follow up on Thursday to come in the office. I did let him know as long as he is running fever he can not come in, that we may need to do as virtual visit. Since he is still sick, should we do virtual visit today?

## 2019-11-27 ENCOUNTER — Telehealth (INDEPENDENT_AMBULATORY_CARE_PROVIDER_SITE_OTHER): Payer: Commercial Managed Care - PPO | Admitting: Internal Medicine

## 2019-11-27 DIAGNOSIS — Z7689 Persons encountering health services in other specified circumstances: Secondary | ICD-10-CM

## 2019-11-27 MED ORDER — SULFAMETHOXAZOLE-TRIMETHOPRIM 800-160 MG PO TABS: 1.0000 | ORAL_TABLET | Freq: Two times a day (BID) | ORAL | 0 refills | Status: DC

## 2019-11-27 NOTE — Telephone Encounter (Signed)
Pt texted last evening.I subsequently called him. He feels like he had an adverse reaction to Cipro in the past. Says pharmacist was not too keen on this Rx but no warning about interactions with SSRI comes up in Epic just reminder about tendinitis. In any case, He is reluctant to take it. He may take Flagyl. Sulfa with Flagyl is an option.

## 2019-11-28 ENCOUNTER — Ambulatory Visit (INDEPENDENT_AMBULATORY_CARE_PROVIDER_SITE_OTHER): Payer: Commercial Managed Care - PPO | Admitting: Internal Medicine

## 2019-11-28 ENCOUNTER — Other Ambulatory Visit: Payer: Self-pay

## 2019-11-28 VITALS — BP 120/70 | HR 70 | Temp 98.0°F

## 2019-11-28 DIAGNOSIS — K5792 Diverticulitis of intestine, part unspecified, without perforation or abscess without bleeding: Secondary | ICD-10-CM

## 2019-11-28 DIAGNOSIS — Z9289 Personal history of other medical treatment: Secondary | ICD-10-CM

## 2019-11-28 DIAGNOSIS — Z8659 Personal history of other mental and behavioral disorders: Secondary | ICD-10-CM | POA: Diagnosis not present

## 2019-11-28 DIAGNOSIS — Z8619 Personal history of other infectious and parasitic diseases: Secondary | ICD-10-CM

## 2019-11-28 NOTE — Progress Notes (Signed)
Subjective:    Patient ID: Paul [name], male    DOB: 1951/09/21, 69 y.o.   MRN: [id number]  HPI 69 year old Male with history of H. pylori diagnosed in September 2020 after presenting with issues of GE reflux and bloating.  He was treated with Biaxin 500 mg twice daily for 14 days, Protonix 40 mg twice daily for 14 days and amoxicillin 1 g twice daily for 14 days.  He was told to have repeat H. pylori breath test 4 weeks later but did not do so.  Had right knee arthroplasty by Dr. Thurston Hole on November 9 and did well.  He was seen here November 16 for routine health maintenance exam.  I suggested repeat H. pylori breath test but he said he was feeling okay so we deferred it.  On December 20, he was admitted with sigmoid diverticulitis with microperforation.  He received IV Zosyn.  He was seen by general surgery.  He had negative blood cultures.  His white blood cell count was 17,800 and normalized prior to his discharge.  He is now being seen for hospital follow-up.  Reports that he has slowly recovered and seems to be doing better.  He has an appointment in the near future to see Dr. Matthias Hughs.  He may need repeat colonoscopy with microperforation.  History of depression treated with Cymbalta.  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  He works 12-hour shifts.    Review of Systems no nausea vomiting or diarrhea.  Appetite is fairly good.     Objective:   Physical Exam Vital signs stable.  Skin warm and dry.  Nodes none.  Neck is supple.  Chest clear.  Cardiac exam regular rate and rhythm normal S1 and S2.  Abdomen no hepatosplenomegaly masses or significant tenderness.  Affect is normal.       Assessment & Plan:  Status post diverticulitis with microperforation sigmoid colon requiring hospitalization and treatment with IV Zosyn.  History of H. pylori- treated  Plan: He has follow-up appointment with Dr. Matthias Hughs in the near future to see if he needs repeat  colonoscopy at this time.

## 2019-12-12 ENCOUNTER — Other Ambulatory Visit: Payer: Self-pay

## 2019-12-12 ENCOUNTER — Other Ambulatory Visit: Payer: Commercial Managed Care - PPO | Admitting: Internal Medicine

## 2019-12-12 DIAGNOSIS — A048 Other specified bacterial intestinal infections: Secondary | ICD-10-CM

## 2019-12-13 LAB — H. PYLORI BREATH TEST: H. pylori Breath Test: DETECTED — AB

## 2019-12-15 ENCOUNTER — Encounter: Payer: Self-pay | Admitting: Internal Medicine

## 2019-12-15 ENCOUNTER — Telehealth: Payer: Self-pay | Admitting: Internal Medicine

## 2019-12-15 MED ORDER — LEVOFLOXACIN 500 MG PO TABS: ORAL_TABLET | ORAL | 0 refills | Status: DC

## 2019-12-15 MED ORDER — AMOXICILLIN 500 MG PO CAPS: ORAL_CAPSULE | ORAL | 0 refills | Status: DC

## 2019-12-15 MED ORDER — PANTOPRAZOLE SODIUM 40 MG PO TBEC: DELAYED_RELEASE_TABLET | ORAL | 3 refills | Status: DC

## 2019-12-15 NOTE — Telephone Encounter (Signed)
Called pt to suggest another regimen for H.pylori. He is ageeable to trying it. Tested positive again recently.   Will try Levaquin 500 mg daily for 14 days #14 Amoxicillin 500mg  2 po bid (1000 mg total bid)  for 7 days #28 Protonix 40 mg one po bid x 14 days #28  Will check H.pylori stool antigen in 4-6 weeks after finishes treatment

## 2019-12-15 NOTE — Patient Instructions (Signed)
Continue to advance diet slowly.  See Dr. Matthias Hughs regarding possible need for colonoscopy.

## 2019-12-23 ENCOUNTER — Other Ambulatory Visit: Payer: Self-pay | Admitting: Internal Medicine

## 2020-01-01 ENCOUNTER — Other Ambulatory Visit: Payer: Self-pay

## 2020-01-01 MED ORDER — PANTOPRAZOLE SODIUM 40 MG PO TBEC: DELAYED_RELEASE_TABLET | ORAL | 3 refills | Status: DC

## 2020-01-01 NOTE — Telephone Encounter (Signed)
Patient called is wanting a 3 month supply on pantoprazole okay to send in?

## 2020-01-13 ENCOUNTER — Other Ambulatory Visit: Payer: Self-pay | Admitting: Internal Medicine

## 2020-01-14 LAB — HELICOBACTER PYLORI  SPECIAL ANTIGEN
MICRO NUMBER:: 10175165
SPECIMEN QUALITY: ADEQUATE

## 2020-02-06 ENCOUNTER — Other Ambulatory Visit: Payer: Self-pay | Admitting: Internal Medicine

## 2020-02-06 NOTE — Telephone Encounter (Signed)
Why does pt need refill? He was doing better. If has recurrence of H. Pylori symptoms, he has to contact Dr. Matthias Hughs.

## 2020-02-06 NOTE — Telephone Encounter (Signed)
Called patient he said he clicked on the wrong rx he said he does not need this medicine.

## 2020-03-27 ENCOUNTER — Other Ambulatory Visit: Payer: Self-pay | Admitting: Internal Medicine

## 2020-04-02 ENCOUNTER — Other Ambulatory Visit: Payer: Self-pay | Admitting: Internal Medicine

## 2020-04-07 ENCOUNTER — Telehealth: Payer: Self-pay | Admitting: Internal Medicine

## 2020-04-07 DIAGNOSIS — R197 Diarrhea, unspecified: Secondary | ICD-10-CM

## 2020-04-07 NOTE — Telephone Encounter (Signed)
Patient called today and I spoke with him personally.  He has a 3 to 4-day history of diarrhea.  This started even before he went to his son's graduation at [employer] this past weekend.  Today he has seen streaks of blood in his stool.  He has no fever chills nausea or vomiting.  He called Dr. Donavan Burnet office and they advised ER evaluation.  He called here for advice.  Patient has a history of H. pylori.  He was treated for this in September 2020.  In December 2020 he was admitted with sigmoid diverticulitis with microperforation.  He was treated with IV Zosyn.  White blood cell count was 17,800.  He is not complaining of abdominal pain.  He has had 3-4 stools a day for 3 to 4 days.  Advised clear liquids and continue to monitor symptoms.  Call back if symptoms worsen.  He is not complaining of abdominal pain but has burning sensation left rib that he has had for some time starting around the time he had H. pylori.

## 2020-04-07 NOTE — Telephone Encounter (Signed)
Pt called and said he has had diarhea for past few days but within the last hour it has been bloody as well. I put him on hold and Dr Lenord Fellers picked up the phone and talked to him

## 2020-04-07 NOTE — Telephone Encounter (Signed)
Patient called today. I spoke with him personally. He has 3-4 day history of diarrhea that started prior to going to son's graduation at [employer] this past weekend.Today has seen streaks of blood in stool and is concerned.

## 2020-05-01 ENCOUNTER — Telehealth: Payer: Self-pay | Admitting: Internal Medicine

## 2020-05-01 NOTE — Telephone Encounter (Signed)
OV next week.

## 2020-05-01 NOTE — Telephone Encounter (Signed)
Paul [name] [phone]  Paul [name] called to say he wanted to come in and talk about DULoxetine and also a couple of personal items like maybe trying some viagra.

## 2020-05-04 NOTE — Telephone Encounter (Signed)
Appointment scheduled.

## 2020-05-05 ENCOUNTER — Other Ambulatory Visit: Payer: Self-pay

## 2020-05-05 ENCOUNTER — Ambulatory Visit (INDEPENDENT_AMBULATORY_CARE_PROVIDER_SITE_OTHER): Payer: Commercial Managed Care - PPO | Admitting: Internal Medicine

## 2020-05-05 ENCOUNTER — Encounter: Payer: Self-pay | Admitting: Internal Medicine

## 2020-05-05 VITALS — BP 120/70 | HR 75 | Ht 72.0 in | Wt 174.0 lb

## 2020-05-05 DIAGNOSIS — N528 Other male erectile dysfunction: Secondary | ICD-10-CM | POA: Diagnosis not present

## 2020-05-05 DIAGNOSIS — N529 Male erectile dysfunction, unspecified: Secondary | ICD-10-CM | POA: Diagnosis not present

## 2020-05-05 MED ORDER — SILDENAFIL CITRATE 100 MG PO TABS: 50.0000 mg | ORAL_TABLET | Freq: Every day | ORAL | 0 refills | Status: DC | PRN

## 2020-05-05 NOTE — Patient Instructions (Signed)
Viagra 100 mg tabs generic Take one half to one tab po one hour before intercourse

## 2020-05-05 NOTE — Progress Notes (Signed)
Subjective:    Patient ID: Paul [name], male    DOB: Mar 27, 1951, 69 y.o.   MRN: [id number]  HPI 69 year old Male in today to discuss medication for erectile dysfunction.   Issues with H.pylori and diverticulitis have resolved. Occasionally has diarrhea.  Remains on Cymbalta. Mood is stable.   Has re-connected with male friend recently.Son graduated from college. Daughter is in nursing school and works at [employer].  Review of Systems no new complaints     Objective:   Physical Exam BP 120/70 pulse 75 pulse ox 98% weight 174 pounds   Affect normal.     Assessment & Plan:  Rx for Viagra 100 mg tablets generic as requested. Discussed how to take medication properly.

## 2020-05-16 ENCOUNTER — Emergency Department (HOSPITAL_COMMUNITY)
Admission: EM | Admit: 2020-05-16 | Discharge: 2020-05-17 | Disposition: A | Discharge status: Home / Self Care | Payer: Commercial Managed Care - PPO | Attending: Emergency Medicine | Admitting: Emergency Medicine

## 2020-05-16 ENCOUNTER — Other Ambulatory Visit: Payer: Self-pay

## 2020-05-16 ENCOUNTER — Encounter (HOSPITAL_COMMUNITY): Payer: Self-pay | Admitting: Emergency Medicine

## 2020-05-16 DIAGNOSIS — Z96652 Presence of left artificial knee joint: Secondary | ICD-10-CM | POA: Insufficient documentation

## 2020-05-16 DIAGNOSIS — Z96651 Presence of right artificial knee joint: Secondary | ICD-10-CM | POA: Insufficient documentation

## 2020-05-16 DIAGNOSIS — F1210 Cannabis abuse, uncomplicated: Secondary | ICD-10-CM | POA: Diagnosis not present

## 2020-05-16 DIAGNOSIS — R1030 Lower abdominal pain, unspecified: Secondary | ICD-10-CM | POA: Diagnosis not present

## 2020-05-16 DIAGNOSIS — K529 Noninfective gastroenteritis and colitis, unspecified: Secondary | ICD-10-CM | POA: Diagnosis not present

## 2020-05-16 DIAGNOSIS — R110 Nausea: Secondary | ICD-10-CM | POA: Diagnosis not present

## 2020-05-16 DIAGNOSIS — Z79899 Other long term (current) drug therapy: Secondary | ICD-10-CM | POA: Insufficient documentation

## 2020-05-16 DIAGNOSIS — R1033 Periumbilical pain: Secondary | ICD-10-CM | POA: Diagnosis present

## 2020-05-16 LAB — COMPREHENSIVE METABOLIC PANEL WITH GFR
ALT: 21 U/L (ref 0–44)
AST: 23 U/L (ref 15–41)
Albumin: 3.9 g/dL (ref 3.5–5.0)
Alkaline Phosphatase: 62 U/L (ref 38–126)
Anion gap: 11 (ref 5–15)
BUN: 18 mg/dL (ref 8–23)
CO2: 24 mmol/L (ref 22–32)
Calcium: 9.8 mg/dL (ref 8.9–10.3)
Chloride: 103 mmol/L (ref 98–111)
Creatinine, Ser: 0.99 mg/dL (ref 0.61–1.24)
GFR calc Af Amer: >60 mL/min
GFR calc non Af Amer: >60 mL/min
Glucose, Bld: 124 mg/dL — ABNORMAL HIGH (ref 70–99)
Potassium: 3.7 mmol/L (ref 3.5–5.1)
Sodium: 138 mmol/L (ref 135–145)
Total Bilirubin: 0.9 mg/dL (ref 0.3–1.2)
Total Protein: 7 g/dL (ref 6.5–8.1)

## 2020-05-16 LAB — CBC
HCT: 46.5 % (ref 39.0–52.0)
Hemoglobin: 15.8 g/dL (ref 13.0–17.0)
MCH: 30.1 pg (ref 26.0–34.0)
MCHC: 34 g/dL (ref 30.0–36.0)
MCV: 88.6 fL (ref 80.0–100.0)
Platelets: 265 10*3/uL (ref 150–400)
RBC: 5.25 MIL/uL (ref 4.22–5.81)
RDW: 13.6 % (ref 11.5–15.5)
WBC: 12.1 10*3/uL — ABNORMAL HIGH (ref 4.0–10.5)
nRBC: 0 % (ref 0.0–0.2)

## 2020-05-16 LAB — ABO/RH
ABO/RH(D): A POS
Weak D: POSITIVE

## 2020-05-16 LAB — TYPE AND SCREEN
ABO/RH(D): A POS
Antibody Screen: NEGATIVE

## 2020-05-16 NOTE — ED Triage Notes (Signed)
Pt c/o abdominal pain that started last night and blood in his stool that started today. Hx diverticulitis.

## 2020-05-17 ENCOUNTER — Emergency Department (HOSPITAL_COMMUNITY): Payer: Commercial Managed Care - PPO

## 2020-05-17 MED ORDER — AMOXICILLIN-POT CLAVULANATE 875-125 MG PO TABS
1.0000 | ORAL_TABLET | Freq: Two times a day (BID) | ORAL | 0 refills | Status: DC
Start: 2020-05-17 — End: 2021-02-15

## 2020-05-17 MED ORDER — MORPHINE SULFATE (PF) 4 MG/ML IV SOLN
4.0000 mg | Freq: Once | INTRAVENOUS | Status: AC
  Administered 2020-05-17: 4 mg via INTRAVENOUS
  Filled 2020-05-17: qty 1

## 2020-05-17 MED ORDER — ONDANSETRON HCL 4 MG/2ML IJ SOLN
4.0000 mg | Freq: Once | INTRAMUSCULAR | Status: AC
  Administered 2020-05-17: 4 mg via INTRAVENOUS
  Filled 2020-05-17: qty 2

## 2020-05-17 MED ORDER — AMOXICILLIN-POT CLAVULANATE 875-125 MG PO TABS
1.0000 | ORAL_TABLET | Freq: Once | ORAL | Status: AC
  Administered 2020-05-17: 1 via ORAL
  Filled 2020-05-17: qty 1

## 2020-05-17 MED ORDER — OXYCODONE-ACETAMINOPHEN 5-325 MG PO TABS
1.0000 | ORAL_TABLET | Freq: Once | ORAL | Status: AC
  Administered 2020-05-17: 1 via ORAL
  Filled 2020-05-17: qty 1

## 2020-05-17 MED ORDER — OXYCODONE-ACETAMINOPHEN 5-325 MG PO TABS: 1.0000 | ORAL_TABLET | ORAL | 0 refills | Status: DC | PRN

## 2020-05-17 MED ORDER — ONDANSETRON 4 MG PO TBDP
4.0000 mg | ORAL_TABLET | Freq: Once | ORAL | Status: AC
  Administered 2020-05-17: 4 mg via ORAL
  Filled 2020-05-17: qty 1

## 2020-05-17 MED ORDER — ONDANSETRON 4 MG PO TBDP
4.0000 mg | ORAL_TABLET | Freq: Three times a day (TID) | ORAL | 0 refills | Status: DC | PRN
Start: 2020-05-17 — End: 2022-02-11

## 2020-05-17 MED ORDER — IOHEXOL 300 MG/ML  SOLN
100.0000 mL | Freq: Once | INTRAMUSCULAR | Status: AC | PRN
  Administered 2020-05-17: 100 mL via INTRAVENOUS

## 2020-05-17 NOTE — ED Provider Notes (Signed)
[facility] EMERGENCY DEPARTMENT Provider Note   CSN: [id number] Arrival date & time: 05/16/20  1734     History Chief Complaint  Patient presents with   Abdominal Pain   GI Bleeding    [name] is a 69 y.o. male.  The history is provided by the patient and medical records.  Abdominal Pain Associated symptoms: diarrhea and nausea     69 y.o. M with hx of anxiety, depression, Hep C, presenting to the ED for abdominal pain.  States he started with diarrhea yesterday but today at work started having worsening abdominal pain, more so peri-umbilical and lower abdomen.  He has had some nausea without vomiting.  Pain worsened this evening, noticed blood in stool x3.  States bright red in color.  Denies melena.  He is not having any rectal pain.  States he has history of diverticulitis with similar pain but never had blood in his stool with prior episode.    Past Medical History:  Diagnosis Date   Anxiety    Depression    Hepatitis    Hep C treated 12 years ago viral load negative after a year of treatment    Primary localized osteoarthritis of right knee 09/19/2019   S/P TKR (total knee replacement), left   11/12/2018 09/19/2019    Patient Active Problem List   Diagnosis Date Noted   Erectile dysfunction 05/05/2020   Diverticulitis of intestine with perforation 11/10/2019   Marijuana abuse 11/10/2019   Alcohol abuse 11/10/2019   Primary localized osteoarthritis of right knee 09/19/2019   S/P TKR (total knee replacement), left   11/12/2018 09/19/2019   History of hepatitis C 12/24/2012   Depression with anxiety 03/19/2012    Past Surgical History:  Procedure Laterality Date   REPLACEMENT TOTAL KNEE Left 11/12/2018   shouler arthroscopy     bilateral    TOTAL KNEE ARTHROPLASTY Right 09/30/2019   Procedure: TOTAL KNEE ARTHROPLASTY;  Surgeon: Salvatore Marvel, MD;  Location: [facility];  Service: Orthopedics;  Laterality: Right;        Family History  Adopted: Yes  Family history unknown: Yes    Social History   Tobacco Use   Smoking status: Never Smoker   Smokeless tobacco: Never Used  Vaping Use   Vaping Use: Never used  Substance Use Topics   Alcohol use: Yes    Comment: daily 2-3 beers    Drug use: Yes    Types: Marijuana    Comment: every day or two    Home Medications Prior to Admission medications   Medication Sig Start Date End Date Taking? Authorizing Provider  ALPRAZolam (XANAX) 0.5 MG tablet TAKE 1 TABLET (0.5 MG TOTAL) BY MOUTH 2 (TWO) TIMES DAILY AS NEEDED FOR ANXIETY. Patient taking differently: Take 0.5 mg by mouth 2 (two) times daily as needed for anxiety or sleep.  04/02/20  Yes Baxley, Luanna Cole, MD  DULoxetine (CYMBALTA) 60 MG capsule TAKE 1 CAPSULE BY MOUTH EVERY DAY Patient taking differently: Take 60 mg by mouth daily.  03/27/20  Yes Baxley, Luanna Cole, MD  Multiple Vitamin (MULTIVITAMIN) capsule Take 1 capsule by mouth daily.   Yes [provider]  Omega-3 Fatty Acids (FISH OIL) 1000 MG CAPS Take by mouth.   Yes [provider]  pantoprazole (PROTONIX) 40 MG tablet One po daily for GERD Patient taking differently: Take 40 mg by mouth daily.  01/01/20  Yes Baxley, Luanna Cole, MD  sildenafil (VIAGRA) 100 MG tablet Take 0.5-1  tablets (50-100 mg total) by mouth daily as needed for erectile dysfunction. 05/05/20  Yes Baxley, Luanna Cole, MD  amoxicillin (AMOXIL) 500 MG capsule 2 po twice daily for 7 days for persistent H.pylori infection Patient not taking: Reported on 05/17/2020 12/15/19   Margaree Mackintosh, MD    Allergies    Ciprofloxacin and Flagyl [metronidazole]  Review of Systems   Review of Systems  Gastrointestinal: Positive for abdominal pain, blood in stool, diarrhea and nausea.  All other systems reviewed and are negative.   Physical Exam Updated Vital Signs BP (!) 141/96 (BP Location: Right Arm)    Pulse 67    Temp 98.1 F (36.7 C) (Oral)    Resp 16    Ht 5\' 11"   (1.803 m)    Wt 79.4 kg    SpO2 100%    BMI 24.41 kg/m   Physical Exam Vitals and nursing note reviewed.  Constitutional:      Appearance: He is well-developed.  HENT:     Head: Normocephalic and atraumatic.  Eyes:     Conjunctiva/sclera: Conjunctivae normal.     Pupils: Pupils are equal, round, and reactive to light.  Cardiovascular:     Rate and Rhythm: Normal rate and regular rhythm.     Heart sounds: Normal heart sounds.  Pulmonary:     Effort: Pulmonary effort is normal.     Breath sounds: Normal breath sounds.  Abdominal:     General: Bowel sounds are normal.     Palpations: Abdomen is soft.     Tenderness: There is abdominal tenderness.     Comments: Mild tenderness throughout lower abdomen, slightly worse on right compared with left, no peritoneal signs  Musculoskeletal:        General: Normal range of motion.     Cervical back: Normal range of motion.  Skin:    General: Skin is warm and dry.  Neurological:     Mental Status: He is alert and oriented to person, place, and time.     ED Results / Procedures / Treatments   Labs (all labs ordered are listed, but only abnormal results are displayed) Labs Reviewed  COMPREHENSIVE METABOLIC PANEL - Abnormal; Notable for the following components:      Result Value   Glucose, Bld 124 (*)    All other components within normal limits  CBC - Abnormal; Notable for the following components:   WBC 12.1 (*)    All other components within normal limits  POC OCCULT BLOOD, ED  TYPE AND SCREEN  ABO/RH    EKG None  Radiology CT ABDOMEN PELVIS W CONTRAST  Result Date: 05/17/2020 CLINICAL DATA:  Abdominal pain since yesterday, blood in stool EXAM: CT ABDOMEN AND PELVIS WITH CONTRAST TECHNIQUE: Multidetector CT imaging of the abdomen and pelvis was performed using the standard protocol following bolus administration of intravenous contrast. CONTRAST:  05/19/2020 OMNIPAQUE IOHEXOL 300 MG/ML  SOLN COMPARISON:  11/10/2019 FINDINGS:  Lower chest: No acute pleural or parenchymal lung disease. Hepatobiliary: No focal liver abnormality is seen. No gallstones, gallbladder wall thickening, or biliary dilatation. Pancreas: Unremarkable. No pancreatic ductal dilatation or surrounding inflammatory changes. Spleen: Normal in size without focal abnormality. Adrenals/Urinary Tract: Adrenal glands are unremarkable. Kidneys are normal, without renal calculi, focal lesion, or hydronephrosis. Bladder is unremarkable. Stomach/Bowel: There is marked mural thickening of the cecum extending into the mid transverse colon. Pericecal fat stranding and multiple small lymph nodes are seen, consistent with reactive change. Overall the findings are compatible with  inflammatory or infectious colitis. No bowel obstruction or ileus. There is diverticulosis of the sigmoid colon without diverticulitis. A normal appendix is seen in the right lower quadrant. Vascular/Lymphatic: Minimal atherosclerosis of the abdominal aorta unchanged. Subcentimeter lymph nodes right lower quadrant mesentery are reactive. No pathologic adenopathy. Reproductive: Prostate is unremarkable. Other: Mesenteric fat stranding right lower quadrant. No free fluid or free gas. No abdominal wall hernia. Musculoskeletal: No acute or destructive bony lesions. Reconstructed images demonstrate no additional findings. IMPRESSION: 1. Marked mural thickening of the cecum extending into the mid transverse colon, compatible with inflammatory or infectious colitis. 2. Sigmoid diverticulosis without diverticulitis. 3.  Aortic Atherosclerosis (ICD10-I70.0). Electronically Signed   By: Randa Ngo M.D.   On: 05/17/2020 02:28    Procedures Procedures (including critical care time)  Medications Ordered in ED Medications  ondansetron (ZOFRAN) injection 4 mg (4 mg Intravenous Given 05/17/20 0128)  morphine 4 MG/ML injection 4 mg (4 mg Intravenous Given 05/17/20 0128)  iohexol (OMNIPAQUE) 300 MG/ML solution 100 mL  (100 mLs Intravenous Contrast Given 05/17/20 0137)  oxyCODONE-acetaminophen (PERCOCET/ROXICET) 5-325 MG per tablet 1 tablet (1 tablet Oral Given 05/17/20 0335)  amoxicillin-clavulanate (AUGMENTIN) 875-125 MG per tablet 1 tablet (1 tablet Oral Given 05/17/20 0335)  ondansetron (ZOFRAN-ODT) disintegrating tablet 4 mg (4 mg Oral Given 05/17/20 0356)    ED Course  I have reviewed the triage vital signs and the nursing notes.  Pertinent labs & imaging results that were available during my care of the patient were reviewed by me and considered in my medical decision making (see chart for details).    MDM Rules/Calculators/A&P  69 year old male presenting to the ED with diarrhea and lower abdominal pain.  He has had some blood noted in the stool today.  He is afebrile and nontoxic.  Does have some mild tenderness along the lower abdomen but no peritoneal signs.  Labs with mild leukocytosis, hemoglobin stable.  Does have history of diverticulitis, states pain feels similar.  Will get CT scan.  3:04 AM CT with findings of colitis, no diverticulitis.  Patient remains hemodynamically stable here in the ED.  We discussed options of the antibiotic treatment-- he has had bad side effects from cipro/flagyl before (states it does not mesh will with his psychiatric medication and caused some suicidal thoughts) so he would prefer to avoid that which seems very reasonable.  We will start course of augmentin.  Will need to monitor symptoms closely.  Encouraged BRAT diet for now, progress as tolerated.  Close follow-up with PCP.  Return here for any new/acute changes.  Final Clinical Impression(s) / ED Diagnoses Final diagnoses:  Colitis    Rx / DC Orders ED Discharge Orders         Ordered    amoxicillin-clavulanate (AUGMENTIN) 875-125 MG tablet  Every 12 hours     Discontinue  Reprint     05/17/20 0307    oxyCODONE-acetaminophen (PERCOCET) 5-325 MG tablet  Every 4 hours PRN     Discontinue  Reprint      05/17/20 0307    ondansetron (ZOFRAN ODT) 4 MG disintegrating tablet  Every 8 hours PRN     Discontinue  Reprint     05/17/20 0307           Larene Pickett, PA-C 05/17/20 0515    Varney Biles, MD 05/20/20 [phone]

## 2020-05-17 NOTE — Progress Notes (Signed)
Patient had 75 ml isovue 300 extravasation left antecubital.Dr brown assessed patient,iv was removed,arm was elevated verbal instructions were given to the patient ,a verbal handoff was given to Shaleah,Benson RN,extravasation discharge orders were placed.

## 2020-05-17 NOTE — Discharge Instructions (Addendum)
Take the prescribed medication as directed.  Do not drive while taking pain medication.   May wish to follow BRAT diet now to help with diarrhea, progress back to normal as tolerated. Follow-up with your primary care doctor. Return to the ED for new or worsening symptoms-- severe pain, high fever, vomiting, cant hold down meds, etc.

## 2020-05-17 NOTE — ED Notes (Signed)
Pt. Transported to CT 

## 2020-05-18 ENCOUNTER — Telehealth: Payer: Self-pay | Admitting: Internal Medicine

## 2020-05-18 NOTE — Telephone Encounter (Signed)
[name] called to let us know that he has an appointment at Dr Buccini's office tomorrow.

## 2020-05-18 NOTE — Telephone Encounter (Signed)
[name] [phone]  [name] called to say he had gone to the ED over the weekend because he was having red blood in his stool and feeling bad, they done a CT, he also was running low grade fever of 100 last night, this morning it is 98.6, and he just feels bad, would like to come in and be seen. He called to make an appointment with GI but the PA can not see him for 2 weeks.

## 2020-05-18 NOTE — Telephone Encounter (Signed)
Called Dr Buccini's office they took message and will call me back.

## 2020-05-18 NOTE — Telephone Encounter (Signed)
Can you please call Dr. Buccini's office and tell them this is urgent.

## 2020-06-11 ENCOUNTER — Other Ambulatory Visit: Payer: Self-pay | Admitting: Internal Medicine

## 2020-06-15 ENCOUNTER — Encounter: Payer: Self-pay | Admitting: Internal Medicine

## 2020-06-15 LAB — HM COLONOSCOPY

## 2020-06-16 NOTE — Telephone Encounter (Signed)
[name] called to check on this refill.    sildenafil (VIAGRA) 100 MG tablet

## 2020-07-01 ENCOUNTER — Encounter: Payer: Self-pay | Admitting: Internal Medicine

## 2021-01-11 ENCOUNTER — Telehealth: Payer: Self-pay | Admitting: Internal Medicine

## 2021-01-11 NOTE — Telephone Encounter (Signed)
Refill x one year °

## 2021-01-11 NOTE — Telephone Encounter (Signed)
Received Fax RX request from  [facility] [address] [phone] - Fax [phone]  Medication - pantoprazole (PROTONIX) 40 MG tablet   Last Refill -  Last OV - 04/25/2020  Last CPE - 10/07/2019  Next Appointment -

## 2021-01-11 NOTE — Telephone Encounter (Signed)
Waiting on patient to reply, we have not sent medications to this pharmacy before.

## 2021-01-12 ENCOUNTER — Other Ambulatory Visit: Payer: Self-pay | Admitting: Internal Medicine

## 2021-01-12 MED ORDER — PANTOPRAZOLE SODIUM 40 MG PO TBEC: DELAYED_RELEASE_TABLET | ORAL | 3 refills | Status: DC

## 2021-01-22 ENCOUNTER — Other Ambulatory Visit: Payer: Self-pay | Admitting: Internal Medicine

## 2021-01-24 NOTE — Telephone Encounter (Signed)
His last CPE was November 2020. Please book before refilling.

## 2021-01-25 NOTE — Telephone Encounter (Signed)
Please sign rx

## 2021-01-25 NOTE — Telephone Encounter (Signed)
Patient called and scheduled CPE 

## 2021-01-25 NOTE — Telephone Encounter (Signed)
He said he will call back to book.

## 2021-02-15 ENCOUNTER — Telehealth: Payer: Self-pay | Admitting: Internal Medicine

## 2021-02-15 ENCOUNTER — Encounter: Payer: Self-pay | Admitting: Internal Medicine

## 2021-02-15 MED ORDER — AMOXICILLIN-POT CLAVULANATE 875-125 MG PO TABS: 1.0000 | ORAL_TABLET | Freq: Two times a day (BID) | ORAL | 0 refills | Status: DC

## 2021-02-15 NOTE — Telephone Encounter (Signed)
Paul [name] [phone]  [facility] [name] called to say he thinks he is having another round of his colitis, that started the day before yesterday, he is having diarrhea, bloating, starting to have chills and some left side abdominal pain. I ask if he had called his GI doctor, he finally said he was waiting on a call back from them, but he also said it takes several weeks to get appointments with them. He does not want to wait to long and end up going to ED.

## 2021-02-15 NOTE — Telephone Encounter (Signed)
Left voice mail message for a call back to get more information.

## 2021-02-15 NOTE — Telephone Encounter (Signed)
I left message for patient and he just returned my call. He has had left sided abdominal pain for 3 days with low grade fever. Says he was treated with antibiotic by ED physician Spring 2021 with similar symptoms and improved. Records indicate it was Augmentin that was prescribed. He just heard back from Dr. Donavan Burnet office and they can see him on Thursday. He is not vomiting. Does not need nausea medication. 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 Offered an appt here but already has appt with Dr. Matthias Hughs.  Advised clear liquid diet. Call in Augmentin 875 mg (#20) tabs to take one by mouth twice daily. Follow up with Dr. Matthias Hughs Thursday or to ED sooner if worse.   Patient is at his work and I am at my office. 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 He is agreeable to speaking with me by phone. The office is closed and a virtual visit could not be arranged at this time.

## 2021-02-16 ENCOUNTER — Other Ambulatory Visit: Payer: Self-pay

## 2021-02-16 ENCOUNTER — Telehealth: Payer: Self-pay | Admitting: Internal Medicine

## 2021-02-16 NOTE — Telephone Encounter (Signed)
error 

## 2021-03-18 ENCOUNTER — Other Ambulatory Visit: Payer: Self-pay

## 2021-03-18 ENCOUNTER — Other Ambulatory Visit: Payer: Commercial Managed Care - PPO | Admitting: Internal Medicine

## 2021-03-18 DIAGNOSIS — N528 Other male erectile dysfunction: Secondary | ICD-10-CM

## 2021-03-18 DIAGNOSIS — Z125 Encounter for screening for malignant neoplasm of prostate: Secondary | ICD-10-CM

## 2021-03-18 DIAGNOSIS — N529 Male erectile dysfunction, unspecified: Secondary | ICD-10-CM

## 2021-03-18 DIAGNOSIS — Z0000 Encounter for general adult medical examination without abnormal findings: Secondary | ICD-10-CM

## 2021-03-18 DIAGNOSIS — E7800 Pure hypercholesterolemia, unspecified: Secondary | ICD-10-CM

## 2021-03-18 DIAGNOSIS — K5792 Diverticulitis of intestine, part unspecified, without perforation or abscess without bleeding: Secondary | ICD-10-CM

## 2021-03-18 DIAGNOSIS — Z8619 Personal history of other infectious and parasitic diseases: Secondary | ICD-10-CM

## 2021-03-18 DIAGNOSIS — Z8659 Personal history of other mental and behavioral disorders: Secondary | ICD-10-CM

## 2021-03-19 LAB — CBC WITH DIFFERENTIAL/PLATELET
Absolute Monocytes: 709 cells/uL (ref 200–950)
Basophils Absolute: 20 cells/uL (ref 0–200)
Basophils Relative: 0.3 %
Eosinophils Absolute: 78 cells/uL (ref 15–500)
Eosinophils Relative: 1.2 %
HCT: 47.3 % (ref 38.5–50.0)
Hemoglobin: 16 g/dL (ref 13.2–17.1)
Lymphs Abs: 1066 cells/uL (ref 850–3900)
MCH: 30.3 pg (ref 27.0–33.0)
MCHC: 33.8 g/dL (ref 32.0–36.0)
MCV: 89.6 fL (ref 80.0–100.0)
MPV: 8.9 fL (ref 7.5–12.5)
Monocytes Relative: 10.9 %
Neutro Abs: 4628 cells/uL (ref 1500–7800)
Neutrophils Relative %: 71.2 %
Platelets: 237 10*3/uL (ref 140–400)
RBC: 5.28 10*6/uL (ref 4.20–5.80)
RDW: 13.7 % (ref 11.0–15.0)
Total Lymphocyte: 16.4 %
WBC: 6.5 10*3/uL (ref 3.8–10.8)

## 2021-03-19 LAB — COMPLETE METABOLIC PANEL WITH GFR
AG Ratio: 1.8 (calc) (ref 1.0–2.5)
ALT: 16 U/L (ref 9–46)
AST: 13 U/L (ref 10–35)
Albumin: 4.2 g/dL (ref 3.6–5.1)
Alkaline phosphatase (APISO): 64 U/L (ref 35–144)
BUN/Creatinine Ratio: 28 (calc) — ABNORMAL HIGH (ref 6–22)
BUN: 26 mg/dL — ABNORMAL HIGH (ref 7–25)
CO2: 26 mmol/L (ref 20–32)
Calcium: 9.4 mg/dL (ref 8.6–10.3)
Chloride: 101 mmol/L (ref 98–110)
Creat: 0.94 mg/dL (ref 0.70–1.25)
GFR, Est African American: 96 mL/min/{1.73_m2} (ref >=60)
GFR, Est Non African American: 83 mL/min/{1.73_m2} (ref >=60)
Globulin: 2.4 g/dL (calc) (ref 1.9–3.7)
Glucose, Bld: 86 mg/dL (ref 65–99)
Potassium: 4.2 mmol/L (ref 3.5–5.3)
Sodium: 137 mmol/L (ref 135–146)
Total Bilirubin: 0.5 mg/dL (ref 0.2–1.2)
Total Protein: 6.6 g/dL (ref 6.1–8.1)

## 2021-03-19 LAB — LIPID PANEL
Cholesterol: 235 mg/dL — ABNORMAL HIGH (ref <200)
HDL: 74 mg/dL (ref >=40)
LDL Cholesterol (Calc): 136 mg/dL (calc) — ABNORMAL HIGH
Non-HDL Cholesterol (Calc): 161 mg/dL (calc) — ABNORMAL HIGH (ref <130)
Total CHOL/HDL Ratio: 3.2 (calc) (ref <5.0)
Triglycerides: 123 mg/dL (ref <150)

## 2021-03-19 LAB — PSA: PSA: 1.29 ng/mL (ref <=4.00)

## 2021-03-19 LAB — HEMOGLOBIN A1C
Hgb A1c MFr Bld: 5.3 % of total Hgb (ref <5.7)
Mean Plasma Glucose: 105 mg/dL
eAG (mmol/L): 5.8 mmol/L

## 2021-03-25 ENCOUNTER — Encounter: Payer: Commercial Managed Care - PPO | Admitting: Internal Medicine

## 2021-03-27 ENCOUNTER — Other Ambulatory Visit: Payer: Self-pay | Admitting: Internal Medicine

## 2021-04-01 ENCOUNTER — Other Ambulatory Visit: Payer: Self-pay

## 2021-04-01 ENCOUNTER — Encounter: Payer: Self-pay | Admitting: Internal Medicine

## 2021-04-01 ENCOUNTER — Ambulatory Visit (INDEPENDENT_AMBULATORY_CARE_PROVIDER_SITE_OTHER): Payer: Commercial Managed Care - PPO | Admitting: Internal Medicine

## 2021-04-01 VITALS — BP 120/70 | HR 75 | Ht 70.0 in | Wt 178.0 lb

## 2021-04-01 DIAGNOSIS — Z8719 Personal history of other diseases of the digestive system: Secondary | ICD-10-CM

## 2021-04-01 DIAGNOSIS — Z8619 Personal history of other infectious and parasitic diseases: Secondary | ICD-10-CM

## 2021-04-01 DIAGNOSIS — Z0000 Encounter for general adult medical examination without abnormal findings: Secondary | ICD-10-CM | POA: Diagnosis not present

## 2021-04-01 DIAGNOSIS — Z8659 Personal history of other mental and behavioral disorders: Secondary | ICD-10-CM

## 2021-04-01 DIAGNOSIS — Z96651 Presence of right artificial knee joint: Secondary | ICD-10-CM

## 2021-04-01 DIAGNOSIS — Z96652 Presence of left artificial knee joint: Secondary | ICD-10-CM

## 2021-04-01 LAB — POCT URINALYSIS DIPSTICK
Appearance: NEGATIVE
Bilirubin, UA: NEGATIVE
Blood, UA: NEGATIVE
Glucose, UA: NEGATIVE
Ketones, UA: NEGATIVE
Leukocytes, UA: NEGATIVE
Nitrite, UA: NEGATIVE
Odor: NEGATIVE
Protein, UA: NEGATIVE
Spec Grav, UA: 1.01 (ref 1.010–1.025)
Urobilinogen, UA: 0.2 E.U./dL
pH, UA: 6.5 (ref 5.0–8.0)
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
## 2021-05-25 NOTE — Patient Instructions (Addendum)
Recommend pneumococcal vaccine in the near future.  Continue current medications.  Watch diet is total cholesterol and LDL cholesterol are slightly elevated.  Otherwise labs are within normal limits.  Recommend COVID booster.  Follow-up in 1 year or as needed.

## 2021-05-28 NOTE — Addendum Note (Signed)
Addended by: Margaree Mackintosh on: 05/28/2021 04:36 PM   Modules accepted: Level of Service

## 2021-06-17 IMAGING — DX DG CHEST 1V PORT
1 series · 1 of 1 positions shown · non-contrast
Comparison: None.

CLINICAL DATA: Fever.

EXAM:
PORTABLE CHEST 1 VIEW

[chest ap]
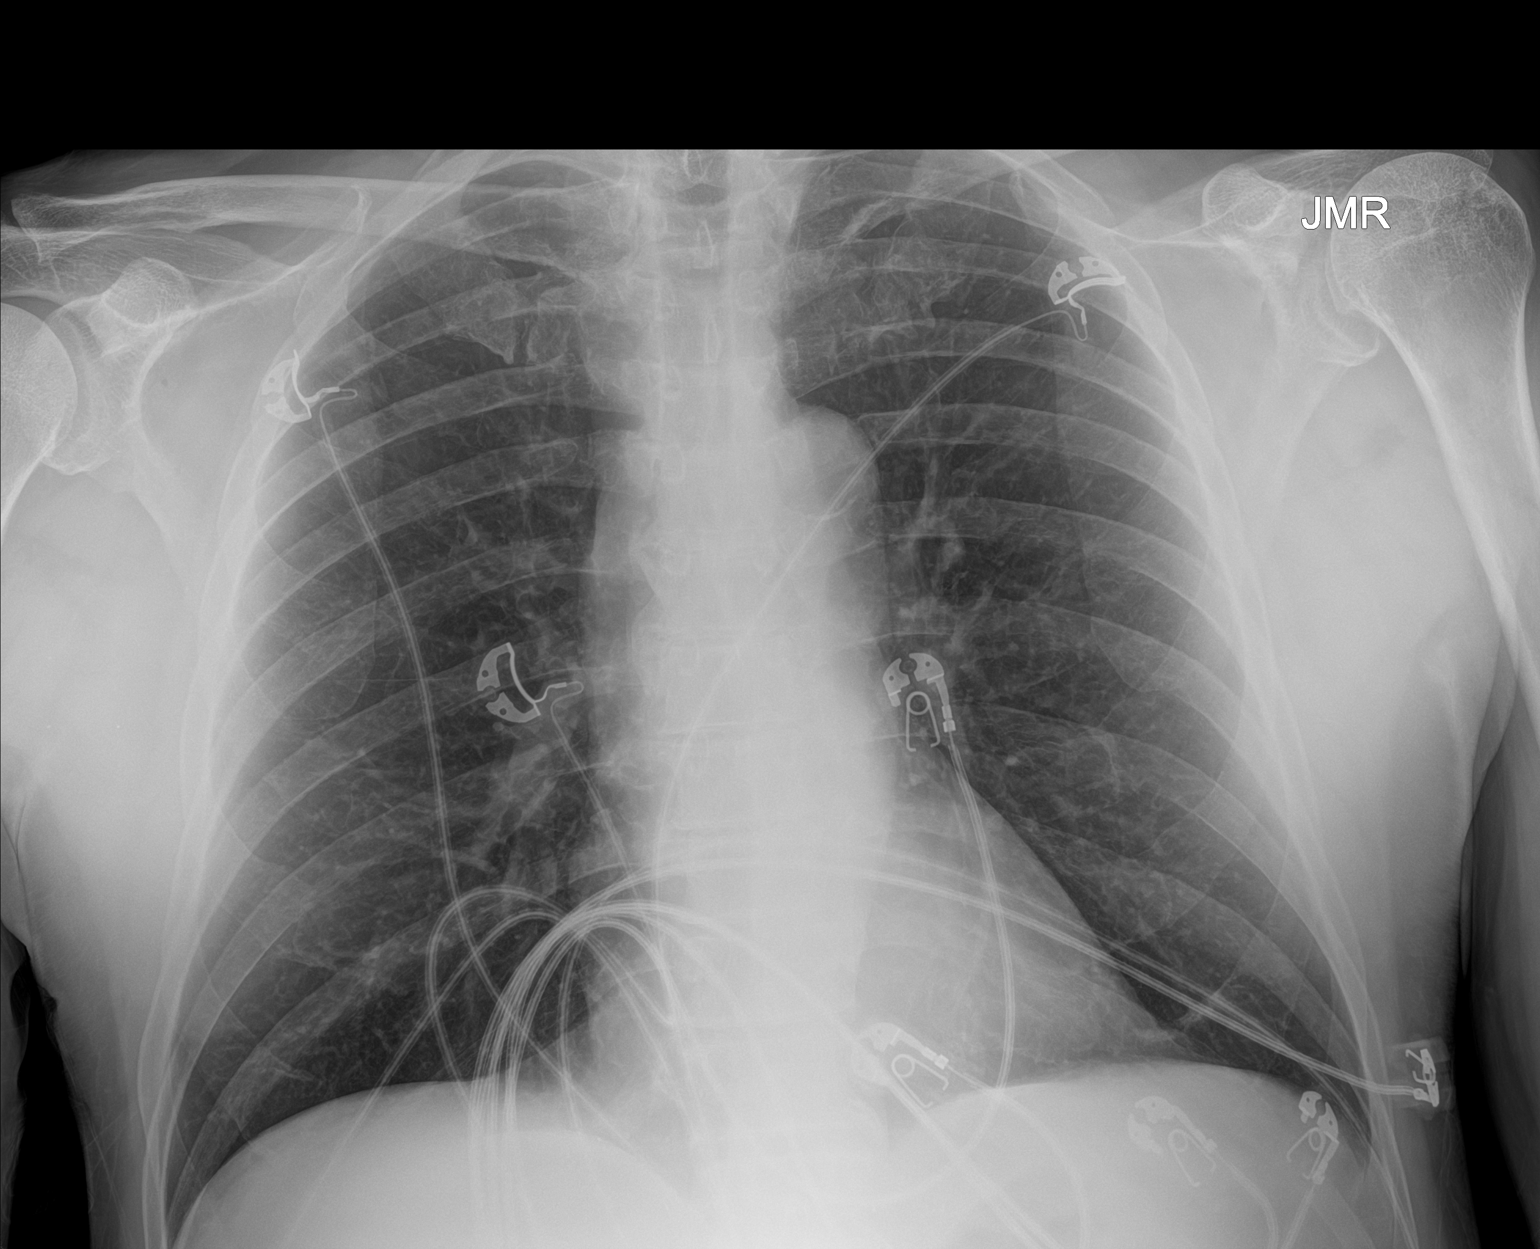

[1 of 1 positions shown; findings below may reference images not displayed]

FINDINGS: The heart size and mediastinal contours are within normal limits.
Aortic atherosclerosis incidentally noted. Both lungs are clear. The
visualized skeletal structures are unremarkable.
IMPRESSION: No active disease.

## 2021-06-17 IMAGING — CT CT ABD-PELV W/ CM
2 of 5 series · 16 of 46 positions shown, 18 images · IV contrast (APPLIED)
Comparison: None.

CLINICAL DATA: Lower abdominal pain, fever, nausea, and diarrhea
beginning last night.

EXAM:
CT ABDOMEN AND PELVIS WITH CONTRAST
TECHNIQUE: Multidetector CT imaging of the abdomen and pelvis was performed
using the standard protocol following bolus administration of
intravenous contrast.
CONTRAST:  100mL OMNIPAQUE IOHEXOL 300 MG/ML  SOLN

[Series 3: abd/ pelvis 5.0 i30f 2 · axial · 0.75mm/px · z∈[+1095,+1500]mm · 13 of 91 slices shown, 15 images]
[im 5/91  soft-tissue]
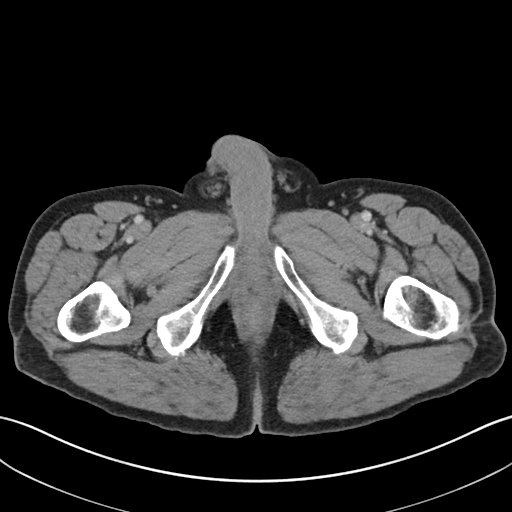
[im 5/91  bone]
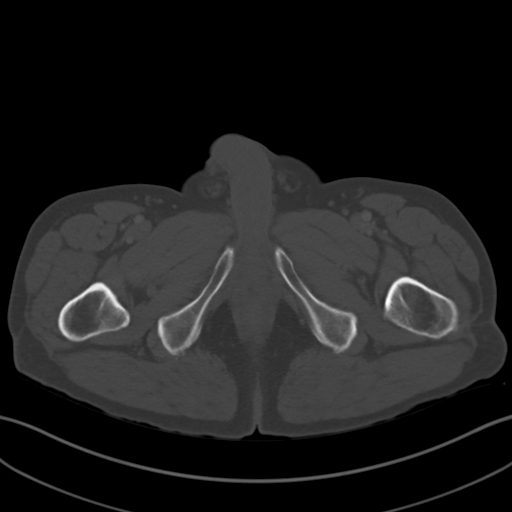
[im 15/91  soft-tissue]
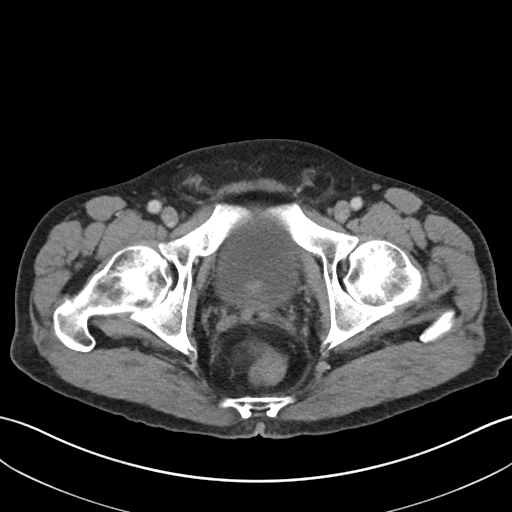
[im 19/91  soft-tissue]
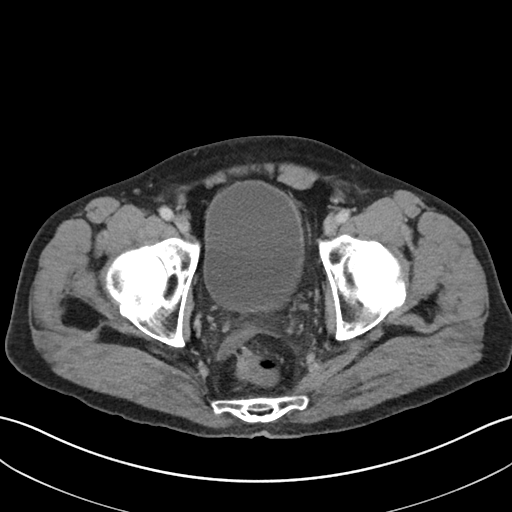
[im 24/91  soft-tissue]
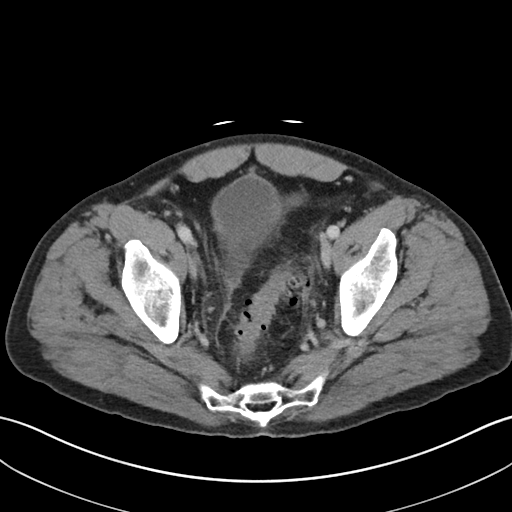
[im 34/91  soft-tissue]
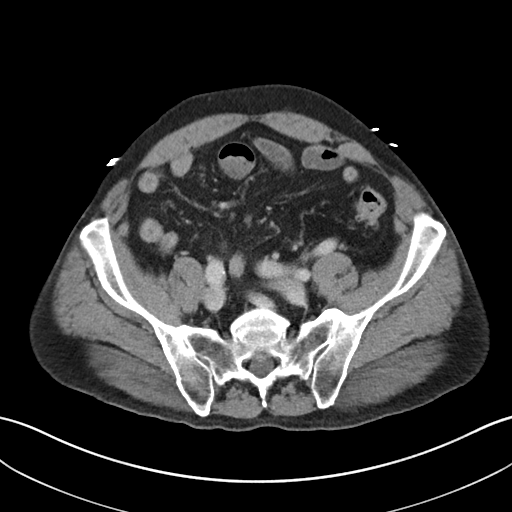
[im 38/91  soft-tissue]
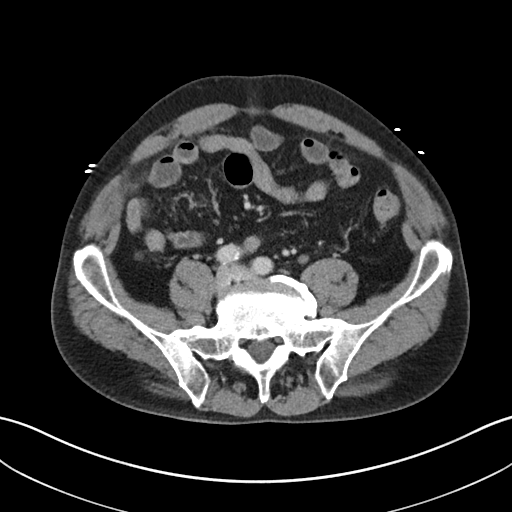
[im 48/91  soft-tissue]
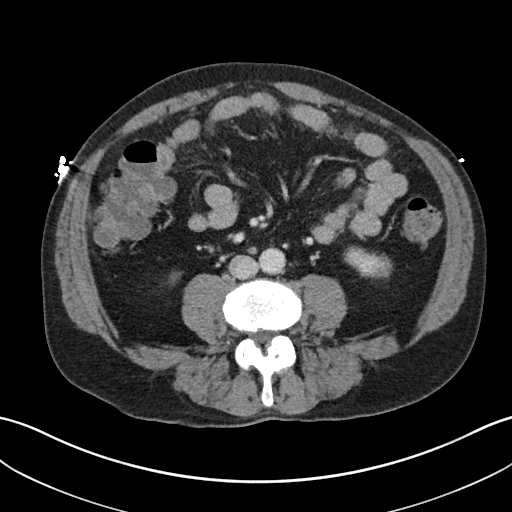
[im 53/91  soft-tissue]
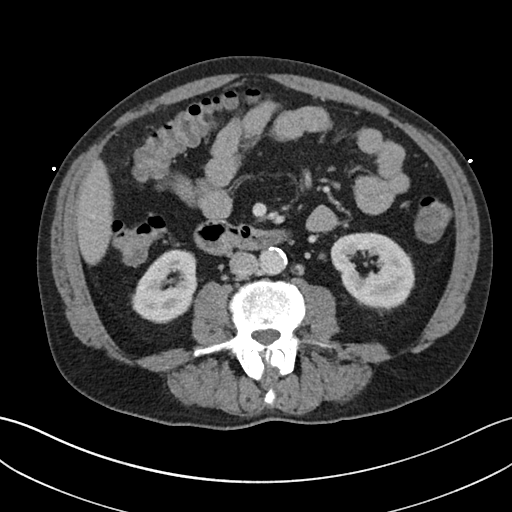
[im 57/91  soft-tissue]
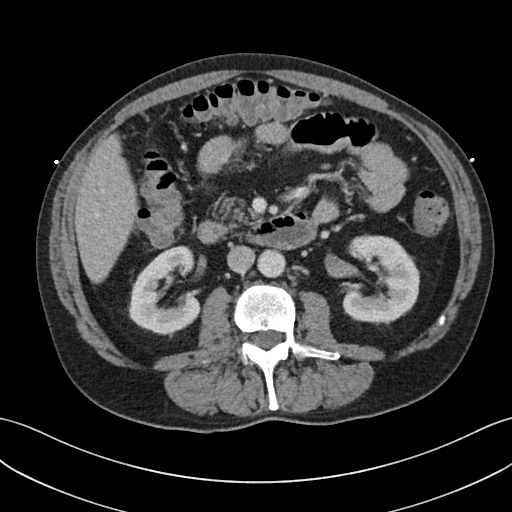
[im 57/91  bone]
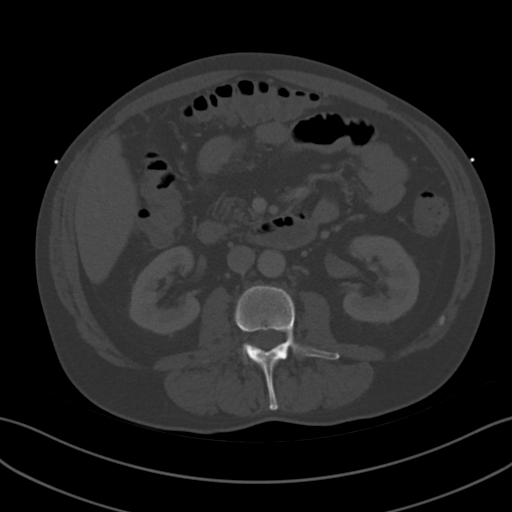
[im 67/91  soft-tissue]
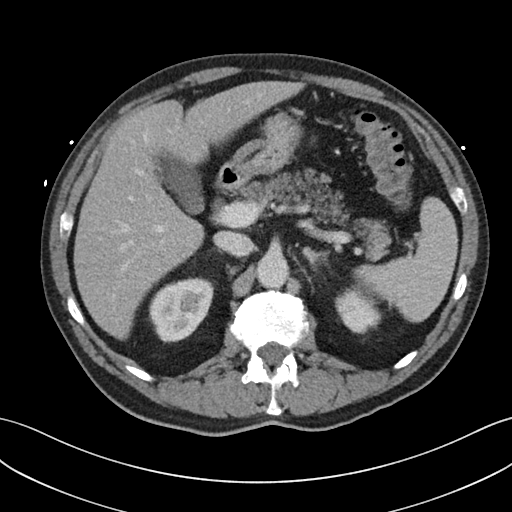
[im 72/91  soft-tissue]
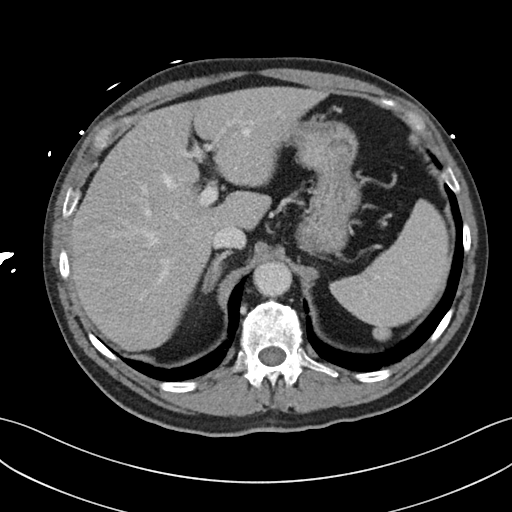
[im 76/91  soft-tissue]
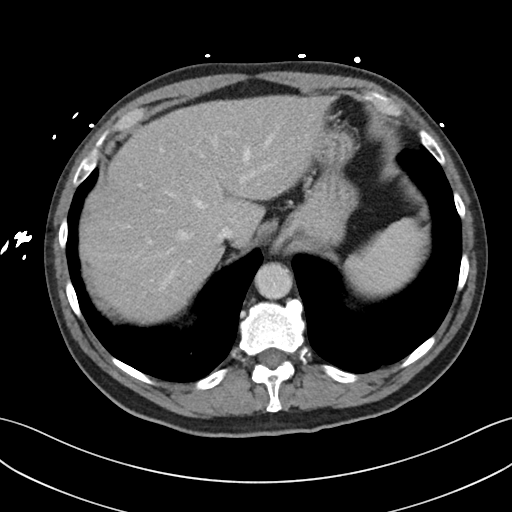
[im 86/91  soft-tissue]
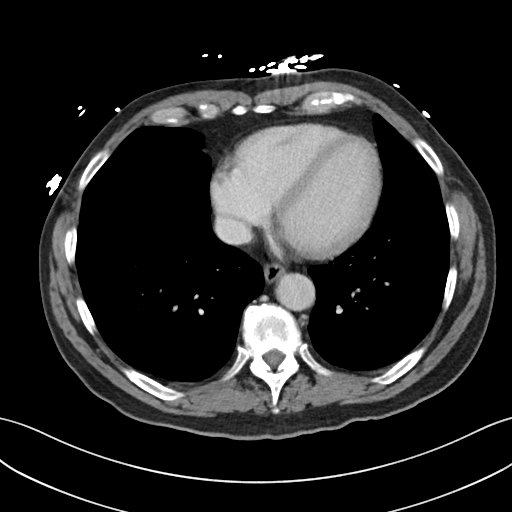

[Series 6: coronal soft tissue · coronal · 0.73mm/px · 3 of 100 slices shown]
[im 34/100  soft-tissue]
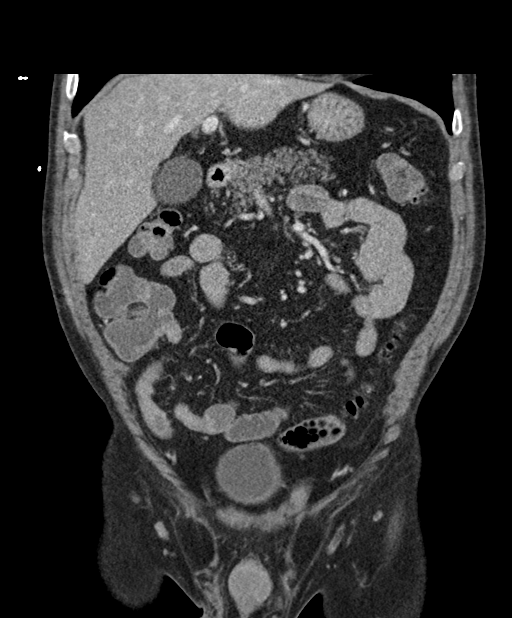
[im 45/100  soft-tissue]
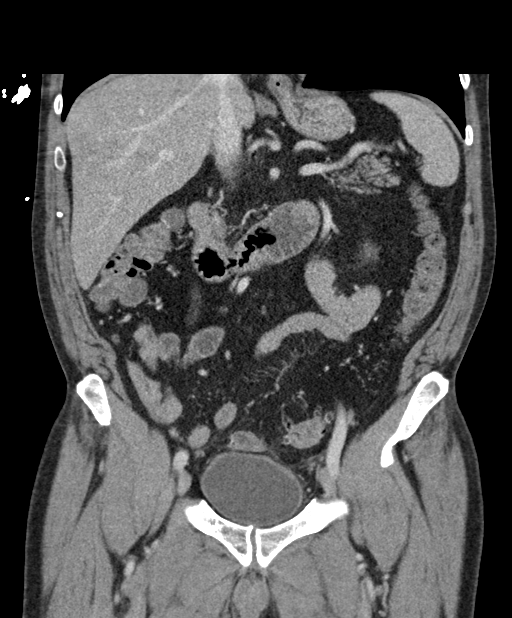
[im 56/100  soft-tissue]
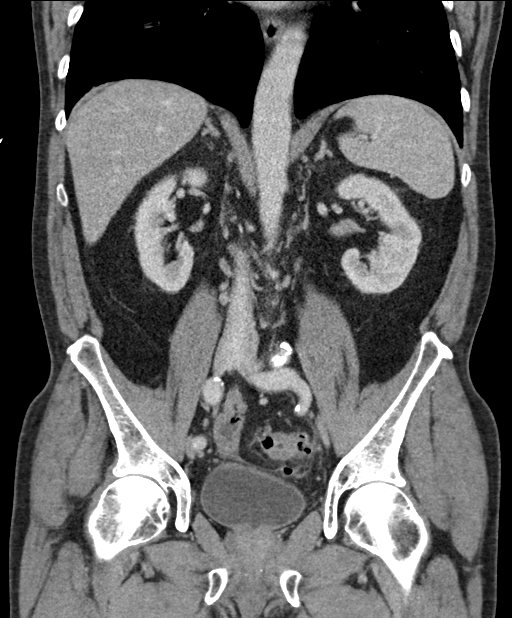

[16 of 46 positions shown; findings below may reference images not displayed]

FINDINGS: Lower Chest: No acute findings.

Hepatobiliary: No hepatic masses identified. Gallbladder is
unremarkable. No evidence of biliary ductal dilatation.

Pancreas:  No mass or inflammatory changes.

Spleen: Within normal limits in size and appearance.

Adrenals/Urinary Tract: No masses identified. No evidence of
hydronephrosis.

Stomach/Bowel: Mild sigmoid diverticulitis is seen with a few tiny
extraluminal air bubbles consistent with micro perforation. No
evidence of free intraperitoneal air. No evidence of abscess or
ascites. Tiny hiatal hernia noted.

Vascular/Lymphatic: No pathologically enlarged lymph nodes. No
abdominal aortic aneurysm. Aortic atherosclerosis incidentally
noted.

Reproductive:  No mass or other significant abnormality.

Other:  None.

Musculoskeletal:  No suspicious bone lesions identified.
IMPRESSION: Mild sigmoid diverticulitis, with microperforation. No evidence of
free intraperitoneal air or abscess.

Aortic Atherosclerosis ([id number]).

## 2021-06-28 ENCOUNTER — Other Ambulatory Visit: Payer: Self-pay

## 2021-06-28 MED ORDER — DULOXETINE HCL 60 MG PO CPEP: 60.0000 mg | ORAL_CAPSULE | Freq: Every day | ORAL | 0 refills | Status: DC

## 2021-07-01 ENCOUNTER — Other Ambulatory Visit: Payer: Self-pay | Admitting: Internal Medicine

## 2021-08-09 ENCOUNTER — Other Ambulatory Visit: Payer: Self-pay | Admitting: Internal Medicine

## 2021-09-27 ENCOUNTER — Other Ambulatory Visit: Payer: Self-pay | Admitting: Internal Medicine

## 2021-09-27 NOTE — Telephone Encounter (Signed)
LVM to CM to schedule CPE for May so we can refill medication

## 2021-09-29 NOTE — Telephone Encounter (Signed)
Scheduled CPE in May

## 2021-11-01 ENCOUNTER — Other Ambulatory Visit: Payer: Self-pay | Admitting: Internal Medicine

## 2022-01-06 ENCOUNTER — Emergency Department (HOSPITAL_BASED_OUTPATIENT_CLINIC_OR_DEPARTMENT_OTHER): Payer: Commercial Managed Care - PPO

## 2022-01-06 ENCOUNTER — Other Ambulatory Visit: Payer: Self-pay

## 2022-01-06 ENCOUNTER — Emergency Department (HOSPITAL_BASED_OUTPATIENT_CLINIC_OR_DEPARTMENT_OTHER)
Admission: EM | Admit: 2022-01-06 | Discharge: 2022-01-06 | Disposition: A | Discharge status: Home / Self Care | Payer: Commercial Managed Care - PPO | Attending: Emergency Medicine | Admitting: Emergency Medicine

## 2022-01-06 ENCOUNTER — Encounter (HOSPITAL_BASED_OUTPATIENT_CLINIC_OR_DEPARTMENT_OTHER): Payer: Self-pay | Admitting: Emergency Medicine

## 2022-01-06 DIAGNOSIS — S0083XA Contusion of other part of head, initial encounter: Secondary | ICD-10-CM | POA: Diagnosis not present

## 2022-01-06 DIAGNOSIS — Y9331 Activity, mountain climbing, rock climbing and wall climbing: Secondary | ICD-10-CM | POA: Insufficient documentation

## 2022-01-06 DIAGNOSIS — W01198A Fall on same level from slipping, tripping and stumbling with subsequent striking against other object, initial encounter: Secondary | ICD-10-CM | POA: Diagnosis not present

## 2022-01-06 DIAGNOSIS — T148XXA Other injury of unspecified body region, initial encounter: Secondary | ICD-10-CM

## 2022-01-06 DIAGNOSIS — W19XXXA Unspecified fall, initial encounter: Secondary | ICD-10-CM

## 2022-01-06 DIAGNOSIS — S0990XA Unspecified injury of head, initial encounter: Secondary | ICD-10-CM | POA: Diagnosis present

## 2022-01-06 NOTE — ED Triage Notes (Signed)
Pt arrives to ED with c/o fall, head injury. Pt reports that he was rock climbing yesterday when he slipped and fell hitting the left side of his face on a rock. This occurred 24 hrs ago. He denies LOC, vision changes. He reports that his face feels swollen with minor pain 1/10. He is not currently on blood thinners. Tylenol has help alleviate his pain.

## 2022-01-06 NOTE — ED Notes (Signed)
Patient verbalizes understanding of discharge instructions. Opportunity for questioning and answers were provided. Patient discharged from ED.  °

## 2022-01-06 NOTE — Discharge Instructions (Signed)
It was a pleasure taking care of you here in the emergency department today  Your CT scan showed a scalp hematoma which is to be expected.  I recommend ice.  May also take Tylenol and ibuprofen as needed for pain.  If you develop any new or worsening symptoms such as severe headache, vomiting more than 3 times in 24 hours, numbness, weakness, passing out please seek reevaluation

## 2022-01-06 NOTE — ED Provider Notes (Signed)
[facility] EMERGENCY DEPT Provider Note   CSN: [id number] Arrival date & time: 01/06/22  1534     History  Chief Complaint  Patient presents with   Paul [name] is a 71 y.o. male here for evaluation of head injury.  He was rockclimbing yesterday was about 2 feet off the ground when he slipped fell and hit the left side of his face on a rock.  He denies any blurred vision, pain with eye movement.  Does have some left-sided periorbital, forehead pain with overlying hematoma.  Has had some dizziness when he moves quickly.  Mild headache.  No midline C/T/L tenderness, chest pain, abdominal pain, paresthesias or weakness  HPI     Home Medications Prior to Admission medications   Medication Sig Start Date End Date Taking? Authorizing Provider  ALPRAZolam (XANAX) 0.5 MG tablet TAKE 1 TABLET BY MOUTH 2 TIMES DAILY AS NEEDED FOR ANXIETY. 11/01/21   Margaree Mackintosh, MD  DULoxetine (CYMBALTA) 60 MG capsule TAKE 1 CAPSULE BY MOUTH EVERY DAY 09/29/21   Margaree Mackintosh, MD  ondansetron (ZOFRAN ODT) 4 MG disintegrating tablet Take 1 tablet (4 mg total) by mouth every 8 (eight) hours as needed for nausea. Patient not taking: Reported on 04/01/2021 05/17/20   Garlon Hatchet, PA-C  oxyCODONE-acetaminophen (PERCOCET) 5-325 MG tablet Take 1 tablet by mouth every 4 (four) hours as needed. Patient not taking: Reported on 04/01/2021 05/17/20   Garlon Hatchet, PA-C  pantoprazole (PROTONIX) 40 MG tablet TAKE 1 TABLET BY MOUTH EVERY DAY FOR GERD 01/12/21   Margaree Mackintosh, MD  sildenafil (VIAGRA) 100 MG tablet TAKE 1/2 TO 1 TABLET BY MOUTH DAILY AS NEEDED FOR ED. 08/09/21   Margaree Mackintosh, MD      Allergies    Ciprofloxacin and Flagyl [metronidazole]    Review of Systems   Review of Systems  Constitutional: Negative.   HENT: Negative.    Eyes: Negative.   Respiratory: Negative.    Cardiovascular: Negative.   Gastrointestinal: Negative.   Genitourinary: Negative.    Musculoskeletal: Negative.   Skin: Negative.   Neurological:  Positive for dizziness and headaches.  All other systems reviewed and are negative.  Physical Exam Updated Vital Signs BP (!) 148/90 (BP Location: Left Arm)    Pulse 90    Temp 98.7 F (37.1 C)    Resp 18    Ht 5\' 11"  (1.803 m)    Wt 81.6 kg    SpO2 100%    BMI 25.10 kg/m  Physical Exam Vitals and nursing note reviewed.  Constitutional:      General: He is not in acute distress.    Appearance: He is well-developed. He is not ill-appearing or diaphoretic.  HENT:     Head: Normocephalic.     Comments: Left periorbital ecchymosis with mild overlying edema to upper eye lid Left forehead hematoma    Nose: Nose normal.     Mouth/Throat:     Mouth: Mucous membranes are moist.  Eyes:     Extraocular Movements: Extraocular movements intact.     Conjunctiva/sclera: Conjunctivae normal.     Pupils: Pupils are equal, round, and reactive to light.     Comments: No traumatic hyphema PERRLA Full ROM without pain  Cardiovascular:     Rate and Rhythm: Normal rate and regular rhythm.     Pulses: Normal pulses.     Heart sounds: Normal heart sounds.  Pulmonary:  Effort: Pulmonary effort is normal. No respiratory distress.     Breath sounds: Normal breath sounds.  Chest:     Comments: Nontender Abdominal:     General: Bowel sounds are normal. There is no distension.     Palpations: Abdomen is soft.     Tenderness: There is no abdominal tenderness.  Musculoskeletal:        General: Normal range of motion.     Cervical back: Normal range of motion and neck supple.     Comments: No midline C/T/L tenderness.  Nontender bilateral upper and lower extremity  Skin:    General: Skin is warm and dry.  Neurological:     General: No focal deficit present.     Mental Status: He is alert and oriented to person, place, and time.     Cranial Nerves: Cranial nerves 2-12 are intact.     Sensory: Sensation is intact.     Motor: Motor  function is intact.     Gait: Gait is intact.     Comments: CN 2-12 grossly intact Equal strength Ambulatory without ataxic gait Intact sensaiton    ED Results / Procedures / Treatments   Labs (all labs ordered are listed, but only abnormal results are displayed) Labs Reviewed - No data to display  EKG None  Radiology CT Head Wo Contrast  Result Date: 01/06/2022 CLINICAL DATA:  Head trauma, moderate-severe; Facial trauma, blunt left facial, periorbital injury EXAM: CT HEAD WITHOUT CONTRAST CT MAXILLOFACIAL WITHOUT CONTRAST TECHNIQUE: Multidetector CT imaging of the head and maxillofacial structures were performed using the standard protocol without intravenous contrast. Multiplanar CT image reconstructions of the maxillofacial structures were also generated. RADIATION DOSE REDUCTION: This exam was performed according to the departmental dose-optimization program which includes automated exposure control, adjustment of the mA and/or kV according to patient size and/or use of iterative reconstruction technique. COMPARISON:  None. FINDINGS: CT HEAD FINDINGS Brain: No evidence of acute intracranial hemorrhage or extra-axial collection.No evidence of mass lesion/concern mass effect.The ventricles are normal in size. Vascular: No hyperdense vessel or unexpected calcification. Skull: Normal. Negative for fracture or focal lesion. Other: Small left frontotemporal scalp hematoma. CT MAXILLOFACIAL FINDINGS Osseous: No fracture or mandibular dislocation. No destructive process. Orbits: Negative. No traumatic or inflammatory finding. Sinuses: Minimal paranasal sinus mucosal thickening. Soft tissues: Left periorbital soft tissue swelling. IMPRESSION: No acute intracranial abnormality.  No acute facial fracture. Small left frontotemporal scalp hematoma. Left periorbital soft tissue swelling. Electronically Signed   By: Caprice Renshaw M.D.   On: 01/06/2022 16:47   CT Maxillofacial Wo Contrast  Result Date:  01/06/2022 CLINICAL DATA:  Head trauma, moderate-severe; Facial trauma, blunt left facial, periorbital injury EXAM: CT HEAD WITHOUT CONTRAST CT MAXILLOFACIAL WITHOUT CONTRAST TECHNIQUE: Multidetector CT imaging of the head and maxillofacial structures were performed using the standard protocol without intravenous contrast. Multiplanar CT image reconstructions of the maxillofacial structures were also generated. RADIATION DOSE REDUCTION: This exam was performed according to the departmental dose-optimization program which includes automated exposure control, adjustment of the mA and/or kV according to patient size and/or use of iterative reconstruction technique. COMPARISON:  None. FINDINGS: CT HEAD FINDINGS Brain: No evidence of acute intracranial hemorrhage or extra-axial collection.No evidence of mass lesion/concern mass effect.The ventricles are normal in size. Vascular: No hyperdense vessel or unexpected calcification. Skull: Normal. Negative for fracture or focal lesion. Other: Small left frontotemporal scalp hematoma. CT MAXILLOFACIAL FINDINGS Osseous: No fracture or mandibular dislocation. No destructive process. Orbits: Negative. No traumatic or inflammatory  finding. Sinuses: Minimal paranasal sinus mucosal thickening. Soft tissues: Left periorbital soft tissue swelling. IMPRESSION: No acute intracranial abnormality.  No acute facial fracture. Small left frontotemporal scalp hematoma. Left periorbital soft tissue swelling. Electronically Signed   By: Caprice Renshaw M.D.   On: 01/06/2022 16:47    Procedures Procedures    Medications Ordered in ED Medications - No data to display  ED Course/ Medical Decision Making/ A&P    71 year old here for evaluation of fall which occurred yesterday.  Was about 2 feet off the ground rockclimbing when subsequently fell hitting the left side of his face.  Does have some obvious scalp hematoma and some ecchymosis to superior periorbital area.  Reassuring no evidence  of acute traumatic eye injury.  Patient neurovascularly intact.  No midline C/T/L tenderness.  No bony tenderness to extremities.  He is not anticoagulated.  Imaging personally viewed and interpreted: CT head and max face do not show evidence of acute intracranial bleed, acute facial fracture however does show small frontal scalp hematoma.  Patient reassessed.  Discussed imaging results with patient and wife in room.  Continues to be neurovascularly intact.  Suspect probably has some degree of postconcussive syndrome.  Discussed symptomatic management, return for new worsening symptoms.  Patient and wife voiced understanding  The patient has been appropriately medically screened and/or stabilized in the ED. I have low suspicion for any other emergent medical condition which would require further screening, evaluation or treatment in the ED or require inpatient management.  Patient is hemodynamically stable and in no acute distress.  Patient able to ambulate in department prior to ED.  Evaluation does not show acute pathology that would require ongoing or additional emergent interventions while in the emergency department or further inpatient treatment.  I have discussed the diagnosis with the patient and answered all questions.  Pain is been managed while in the emergency department and patient has no further complaints prior to discharge.  Patient is comfortable with plan discussed in room and is stable for discharge at this time.  I have discussed strict return precautions for returning to the emergency department.  Patient was encouraged to follow-up with PCP/specialist refer to at discharge.                           Medical Decision Making Amount and/or Complexity of Data Reviewed External Data Reviewed: labs, radiology and notes. Radiology: ordered and independent interpretation performed. Decision-making details documented in ED Course.  Risk OTC drugs. Prescription drug management. Risk  Details: Do not feel patient needs additional labs, imaging, hospitalization at this         Final Clinical Impression(s) / ED Diagnoses Final diagnoses:  Fall, initial encounter  Hematoma    Rx / DC Orders ED Discharge Orders     None         Lamiyah Schlotter A, PA-C 01/06/22 1658    Lorre Nick, MD 01/06/22 1850

## 2022-01-07 ENCOUNTER — Telehealth: Payer: Self-pay | Admitting: Internal Medicine

## 2022-01-07 NOTE — Telephone Encounter (Signed)
Called to check on Paul [name] after his fall. Patient stated he was fine, he has taken a few days to recuperate, he looks like a zombie he said. He feels fine, he will call back if he needs anything.

## 2022-02-09 ENCOUNTER — Telehealth: Payer: Self-pay | Admitting: Internal Medicine

## 2022-02-09 NOTE — Telephone Encounter (Signed)
Paul [name] ?[phone] ? ?[name] called to say he would like to coe in and discuss his medications, he does not think the one below is working anymore. ? ?DULoxetine (CYMBALTA) 60 MG capsule ?

## 2022-02-09 NOTE — Telephone Encounter (Signed)
After discussing with Dr Renold Genta schedule an appointment for Friday. ?

## 2022-02-11 ENCOUNTER — Other Ambulatory Visit: Payer: Self-pay

## 2022-02-11 ENCOUNTER — Encounter: Payer: Self-pay | Admitting: Internal Medicine

## 2022-02-11 ENCOUNTER — Ambulatory Visit (INDEPENDENT_AMBULATORY_CARE_PROVIDER_SITE_OTHER): Payer: Commercial Managed Care - PPO | Admitting: Internal Medicine

## 2022-02-11 VITALS — BP 130/76 | HR 75 | Temp 97.8°F | Ht 71.0 in | Wt 185.2 lb

## 2022-02-11 DIAGNOSIS — Z8659 Personal history of other mental and behavioral disorders: Secondary | ICD-10-CM

## 2022-02-11 DIAGNOSIS — F419 Anxiety disorder, unspecified: Secondary | ICD-10-CM | POA: Diagnosis not present

## 2022-02-11 MED ORDER — TADALAFIL 20 MG PO TABS: 10.0000 mg | ORAL_TABLET | ORAL | 11 refills | Status: DC | PRN

## 2022-02-11 MED ORDER — FLUTICASONE PROPIONATE 50 MCG/ACT NA SUSP: 2.0000 | Freq: Every day | NASAL | 6 refills | Status: DC

## 2022-02-11 NOTE — Progress Notes (Signed)
? ?  Subjective:  ? ? Patient ID: Paul [name], male    DOB: 08/01/1951, 71 y.o.   MRN: [id number] ? ?HPI He is here to discuss his medications and recent symptoms. In February, he had a fall while rock climbing and struck left side of face on a rock. Had some dizziness with quick movement and mild headache, No LOC. Was seen at [facility]. Had left periorbital ecchymosis and left forehead hematoma. Vs were stable and CTwas unremarkable.No facial fractures. He has recovered from this accident. He has decided to retire in August. Going to [place] with his 2 children this Summer. Has been anxious recently but Erie Noe is not sure why. He tried stopping his Xanax and symptoms became worse. He has taken Xanax generally at night not twice daily as prescribed for some time. Has taken Cymbalta for depression for a number of years. Work is hard but he handles it well.He is an ICU Nurse at [employer]. ? ? ? ?Review of Systems see above- anxious if does not take Xanax- could be discontinuation syndrome and/ or anxiety ? ?Also having some allergy symptoms and Flonase nasal spray has been prescribed.  Cialis refilled. ? ?   ?Objective:  ? Physical Exam ? His affect is normal. Describes anxiety symptoms in detail if misses a dose of Xanax. This is upsetting to him. He would prefer not to take it but has longstanding hx of anxiety. He has no facial weakness. He is alert and oriented with normal affect. ? ? ? ?   ?Assessment & Plan:  ? ?Anxiety- may be due to impending retirement/job stress or inherited tendency to be anxious. May have had some discontinuation symptoms when not taking it. He does better with it than without it so he should go back on it. He has no side effects from it. Continue Cymbalta. Has CPE in May. ?Over 30 minutes spent with patient discussing this issue. ?

## 2022-02-12 NOTE — Patient Instructions (Addendum)
Restart Xanax 0.5 mg to take twice daily if needed.  At least take once daily in the evening.  Return May 15 for follow-up.  Flonase prescribed for allergic rhinitis.  Cialis refilled ?

## 2022-02-13 ENCOUNTER — Other Ambulatory Visit: Payer: Self-pay | Admitting: Internal Medicine

## 2022-02-16 ENCOUNTER — Telehealth: Payer: Self-pay

## 2022-02-16 NOTE — Telephone Encounter (Signed)
Patient states that his anxiety was better with increase of medication but it returns. He is asking fro psych referral.  ?

## 2022-03-31 ENCOUNTER — Other Ambulatory Visit (INDEPENDENT_AMBULATORY_CARE_PROVIDER_SITE_OTHER): Payer: Self-pay

## 2022-03-31 DIAGNOSIS — Z136 Encounter for screening for cardiovascular disorders: Secondary | ICD-10-CM

## 2022-03-31 DIAGNOSIS — F1010 Alcohol abuse, uncomplicated: Secondary | ICD-10-CM

## 2022-03-31 DIAGNOSIS — F418 Other specified anxiety disorders: Secondary | ICD-10-CM

## 2022-03-31 DIAGNOSIS — Z125 Encounter for screening for malignant neoplasm of prostate: Secondary | ICD-10-CM

## 2022-04-01 LAB — COMPLETE METABOLIC PANEL WITH GFR
AG Ratio: 1.7 (calc) (ref 1.0–2.5)
ALT: 20 U/L (ref 9–46)
AST: 21 U/L (ref 10–35)
Albumin: 4.5 g/dL (ref 3.6–5.1)
Alkaline phosphatase (APISO): 62 U/L (ref 35–144)
BUN: 13 mg/dL (ref 7–25)
CO2: 28 mmol/L (ref 20–32)
Calcium: 10 mg/dL (ref 8.6–10.3)
Chloride: 103 mmol/L (ref 98–110)
Creat: 0.97 mg/dL (ref 0.70–1.35)
Globulin: 2.6 g/dL (calc) (ref 1.9–3.7)
Glucose, Bld: 95 mg/dL (ref 65–99)
Potassium: 4.7 mmol/L (ref 3.5–5.3)
Sodium: 141 mmol/L (ref 135–146)
Total Bilirubin: 0.6 mg/dL (ref 0.2–1.2)
Total Protein: 7.1 g/dL (ref 6.1–8.1)
eGFR: 85 mL/min/{1.73_m2} (ref >=60)

## 2022-04-01 LAB — CBC WITH DIFFERENTIAL/PLATELET
Absolute Monocytes: 470 cells/uL (ref 200–950)
Basophils Absolute: 41 cells/uL (ref 0–200)
Basophils Relative: 0.7 %
Eosinophils Absolute: 70 cells/uL (ref 15–500)
Eosinophils Relative: 1.2 %
HCT: 48.3 % (ref 38.5–50.0)
Hemoglobin: 16.2 g/dL (ref 13.2–17.1)
Lymphs Abs: 760 cells/uL — ABNORMAL LOW (ref 850–3900)
MCH: 30.6 pg (ref 27.0–33.0)
MCHC: 33.5 g/dL (ref 32.0–36.0)
MCV: 91.1 fL (ref 80.0–100.0)
MPV: 9.1 fL (ref 7.5–12.5)
Monocytes Relative: 8.1 %
Neutro Abs: 4460 cells/uL (ref 1500–7800)
Neutrophils Relative %: 76.9 %
Platelets: 251 10*3/uL (ref 140–400)
RBC: 5.3 10*6/uL (ref 4.20–5.80)
RDW: 13.1 % (ref 11.0–15.0)
Total Lymphocyte: 13.1 %
WBC: 5.8 10*3/uL (ref 3.8–10.8)

## 2022-04-01 LAB — LIPID PANEL
Cholesterol: 268 mg/dL — ABNORMAL HIGH (ref <200)
HDL: 65 mg/dL (ref >=40)
LDL Cholesterol (Calc): 172 mg/dL (calc) — ABNORMAL HIGH
Non-HDL Cholesterol (Calc): 203 mg/dL (calc) — ABNORMAL HIGH (ref <130)
Total CHOL/HDL Ratio: 4.1 (calc) (ref <5.0)
Triglycerides: 164 mg/dL — ABNORMAL HIGH (ref <150)

## 2022-04-01 LAB — PSA: PSA: 1.6 ng/mL (ref <=4.00)

## 2022-04-04 ENCOUNTER — Encounter: Payer: Self-pay | Admitting: Internal Medicine

## 2022-04-04 ENCOUNTER — Ambulatory Visit (INDEPENDENT_AMBULATORY_CARE_PROVIDER_SITE_OTHER): Payer: Self-pay | Admitting: Internal Medicine

## 2022-04-04 VITALS — BP 142/88 | HR 76 | Temp 98.8°F | Ht 69.75 in | Wt 185.5 lb

## 2022-04-04 DIAGNOSIS — Z8659 Personal history of other mental and behavioral disorders: Secondary | ICD-10-CM

## 2022-04-04 DIAGNOSIS — Z8719 Personal history of other diseases of the digestive system: Secondary | ICD-10-CM

## 2022-04-04 DIAGNOSIS — Z96652 Presence of left artificial knee joint: Secondary | ICD-10-CM

## 2022-04-04 DIAGNOSIS — Z96651 Presence of right artificial knee joint: Secondary | ICD-10-CM

## 2022-04-04 DIAGNOSIS — Z8619 Personal history of other infectious and parasitic diseases: Secondary | ICD-10-CM

## 2022-04-04 DIAGNOSIS — Z0000 Encounter for general adult medical examination without abnormal findings: Secondary | ICD-10-CM

## 2022-04-04 LAB — POCT URINALYSIS DIPSTICK
Bilirubin, UA: NEGATIVE
Blood, UA: NEGATIVE
Glucose, UA: NEGATIVE
Ketones, UA: NEGATIVE
Leukocytes, UA: NEGATIVE
Nitrite, UA: NEGATIVE
Protein, UA: NEGATIVE
Spec Grav, UA: 1.02 (ref 1.010–1.025)
Urobilinogen, UA: 0.2 E.U./dL
pH, UA: 6 (ref 5.0–8.0)
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
## 2022-04-04 NOTE — Patient Instructions (Addendum)
Coronary calcium score discussed and will be re-visited in 6 months. Wants to wait on lipid lowering medication and follow up in 6 months. ?

## 2022-04-12 ENCOUNTER — Other Ambulatory Visit: Payer: Self-pay | Admitting: Internal Medicine

## 2022-06-28 ENCOUNTER — Ambulatory Visit (INDEPENDENT_AMBULATORY_CARE_PROVIDER_SITE_OTHER): Payer: Medicare Other | Admitting: Internal Medicine

## 2022-06-28 ENCOUNTER — Encounter: Payer: Self-pay | Admitting: Internal Medicine

## 2022-06-28 VITALS — BP 124/80 | HR 75 | Temp 98.2°F | Wt 183.8 lb

## 2022-06-28 DIAGNOSIS — Z7184 Encounter for health counseling related to travel: Secondary | ICD-10-CM | POA: Diagnosis not present

## 2022-06-28 MED ORDER — DOXYCYCLINE HYCLATE 100 MG PO TABS: 100.0000 mg | ORAL_TABLET | Freq: Two times a day (BID) | ORAL | 0 refills | Status: DC

## 2022-06-28 MED ORDER — ALPRAZOLAM 0.5 MG PO TABS: 0.5000 mg | ORAL_TABLET | Freq: Two times a day (BID) | ORAL | 3 refills | Status: AC | PRN

## 2022-06-28 NOTE — Progress Notes (Signed)
Subjective:    Patient ID: Paul [name], male    DOB: 12-22-1950, 71 y.o.   MRN: [id number]  HPI  71 year old Male seen in May for health maintenance exam.  He had mixed hyperlipidemia with total cholesterol 268, triglycerides 164 and LDL cholesterol 172.  Did not want to be on statin therapy.  He is going to [place] with his children on a vacation in the near future.  Wanted to make sure he had medications on hand for travel as needed.  I have provided him with a prescription for doxycycline 100 mg twice daily for 10 days.  Xanax 0.5 mg refilled to take twice daily as needed.  He does have Protonix 40 mg daily available for GE reflux.  Has Flonase nasal spray as well.  Suggest tetanus update at pharmacy.  Last one on file here is 2013.  Gets annual flu vaccine.  COVID booster was done in February 2022.  Consider COVID booster before traveling but new booster will be available this Fall.  He has a history of anxiety but that has improved considerably since he is now retired. He occasionally has bouts of diverticulitis but none recently.  Doxycycline prescribed for travel 100 mg twice daily for 10 days if needed.   Review of Systems no new complaints-looking forward to trip and enjoying retirement     Objective:   Physical Exam   BP 124/80 pulse 75, T 98.2 degrees, pulse ox 98%, BMI 26.56 Chest clear.  Cardiac exam: Regular rate and rhythm.     Assessment & Plan:    Travel advice encounter-doxycycline prescribed as well as Xanax for travel.  Plan: Patient has 38-month follow-up appointment already set for November 2023.

## 2022-08-06 NOTE — Patient Instructions (Signed)
Xanax refilled for travel to [place].  Also prescription for doxycycline 100 mg twice daily for 10 days if needed.  Has follow-up appointment here in November.  It was a pleasure to see you today.

## 2022-10-04 ENCOUNTER — Other Ambulatory Visit: Payer: Medicare Other

## 2022-10-04 DIAGNOSIS — E782 Mixed hyperlipidemia: Secondary | ICD-10-CM

## 2022-10-04 DIAGNOSIS — E7800 Pure hypercholesterolemia, unspecified: Secondary | ICD-10-CM

## 2022-10-04 LAB — LIPID PANEL
Cholesterol: 239 mg/dL — ABNORMAL HIGH (ref <200)
HDL: 64 mg/dL (ref >=40)
LDL Cholesterol (Calc): 154 mg/dL (calc) — ABNORMAL HIGH
Non-HDL Cholesterol (Calc): 175 mg/dL (calc) — ABNORMAL HIGH (ref <130)
Total CHOL/HDL Ratio: 3.7 (calc) (ref <5.0)
Triglycerides: 100 mg/dL (ref <150)

## 2022-10-04 LAB — CBC WITH DIFFERENTIAL/PLATELET
Absolute Monocytes: 360 cells/uL (ref 200–950)
Basophils Absolute: 32 cells/uL (ref 0–200)
Basophils Relative: 0.7 %
Eosinophils Absolute: 108 cells/uL (ref 15–500)
Eosinophils Relative: 2.4 %
HCT: 43.6 % (ref 38.5–50.0)
Hemoglobin: 15.2 g/dL (ref 13.2–17.1)
Lymphs Abs: 801 cells/uL — ABNORMAL LOW (ref 850–3900)
MCH: 30.5 pg (ref 27.0–33.0)
MCHC: 34.9 g/dL (ref 32.0–36.0)
MCV: 87.6 fL (ref 80.0–100.0)
MPV: 8.7 fL (ref 7.5–12.5)
Monocytes Relative: 8 %
Neutro Abs: 3200 cells/uL (ref 1500–7800)
Neutrophils Relative %: 71.1 %
Platelets: 291 10*3/uL (ref 140–400)
RBC: 4.98 10*6/uL (ref 4.20–5.80)
RDW: 13.7 % (ref 11.0–15.0)
Total Lymphocyte: 17.8 %
WBC: 4.5 10*3/uL (ref 3.8–10.8)

## 2022-10-06 ENCOUNTER — Encounter: Payer: Self-pay | Admitting: Internal Medicine

## 2022-10-06 ENCOUNTER — Ambulatory Visit (INDEPENDENT_AMBULATORY_CARE_PROVIDER_SITE_OTHER): Payer: Medicare Other | Admitting: Internal Medicine

## 2022-10-06 VITALS — BP 120/76 | HR 62 | Temp 98.9°F | Ht 69.75 in | Wt 177.8 lb

## 2022-10-06 DIAGNOSIS — Z8659 Personal history of other mental and behavioral disorders: Secondary | ICD-10-CM

## 2022-10-06 DIAGNOSIS — Z8719 Personal history of other diseases of the digestive system: Secondary | ICD-10-CM

## 2022-10-06 DIAGNOSIS — F418 Other specified anxiety disorders: Secondary | ICD-10-CM

## 2022-10-06 DIAGNOSIS — K5780 Diverticulitis of intestine, part unspecified, with perforation and abscess without bleeding: Secondary | ICD-10-CM

## 2022-10-06 DIAGNOSIS — Z7185 Encounter for immunization safety counseling: Secondary | ICD-10-CM | POA: Diagnosis not present

## 2022-10-06 DIAGNOSIS — Z8619 Personal history of other infectious and parasitic diseases: Secondary | ICD-10-CM | POA: Diagnosis not present

## 2022-10-06 DIAGNOSIS — Z23 Encounter for immunization: Secondary | ICD-10-CM

## 2022-10-06 DIAGNOSIS — M1711 Unilateral primary osteoarthritis, right knee: Secondary | ICD-10-CM

## 2022-11-18 ENCOUNTER — Encounter: Payer: Self-pay | Admitting: Internal Medicine

## 2022-11-19 NOTE — Patient Instructions (Addendum)
It was a pleasure to see you today and noted that you are doing well.  Glad to hear you are enjoying your retirement.  No change in medications.  Follow-up in 6 months for Medicare wellness and health maintenance exam.  Flu vaccine given.

## 2022-11-19 NOTE — Progress Notes (Addendum)
Subjective:    Patient ID: Paul [name], male    DOB: 03-21-1951, 71 y.o.   MRN: [id number]  HPI 71 year old Male in today for 62-month recheck.  He is enjoying retirement.  He traveled to [place] with his children a few months ago and had a wonderful time.  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  His depression is well-controlled on current regimen and his affect is quite normal.  Lipids have improved from last visit at which time total cholesterol was 268 and is now 239.  Triglycerides have improved from 164 to 100 and LDL has improved from 172 to 154.  This is with diet alone and not with statin medication.  Prefers not to be on statin medication.  History of GE reflux treated with Protonix.  Allergic rhinitis symptoms treated with Flonase.  Anxiety and depression treated with Cymbalta and Xanax.  No issues in some time with episodes of diverticulitis.  History of bilateral knee arthroplasty.  History of H. pylori in 2020 treated with 3 drug regimen.  Had issues continuing to test positive and was given a different regimen in January 2021.  In May 2021 developed diarrhea with blood streaks in the stool.  In December 2020 admitted with sigmoid diverticulitis with microperforation treated with IV Zosyn.  White count was 17,800.  In March 2022 had another bout of colitis and was treated with Augmentin.  Last colonoscopy was July 2021.  Biopsies were negative for dysplasia and follow-up recommended in 10 years.  Remote history of hepatitis C treated in 2007 by Dr. Lula Olszewski in [place].  Liver functions are monitored regularly and have been stable.  Review of Systems see above-no new complaints     Objective:   Physical Exam Weight is stable and BMI is 25.69  Blood pressure 120/76, pulse 62, temperature 98.9 degrees, pulse oximetry 98%, Weight 177 pounds 12.8 ounces, Height 5 feet 9.75 inches  Chest is clear.  Neck is supple without  JVD thyromegaly or carotid bruits.  Cardiac exam: Regular rate and rhythm without ectopy.  No lower extremity edema.  Affect thought and judgment are normal.     Assessment & Plan:   Patient is doing well at the present time.  Flu vaccine given.  Labs are stable and there will be no change in his medications at the present time.  His anxiety is under good control as his GE reflux and he has had no flares of diverticulitis.  He is enjoying retirement.  Return in 6 months for Medicare wellness and health maintenance exam.

## 2022-11-19 NOTE — Addendum Note (Signed)
Addended by: Theresia Lo on: 11/19/2022 10:44 PM   Modules accepted: Level of Service

## 2022-11-24 NOTE — Addendum Note (Signed)
Addended by: Elby Showers on: 11/24/2022 10:17 AM   Modules accepted: Level of Service

## 2022-11-24 NOTE — Addendum Note (Signed)
Addended by: Geradine Girt D on: 11/24/2022 12:05 PM   Modules accepted: Level of Service

## 2022-12-18 ENCOUNTER — Other Ambulatory Visit: Payer: Self-pay | Admitting: Internal Medicine

## 2022-12-29 DIAGNOSIS — H93292 Other abnormal auditory perceptions, left ear: Secondary | ICD-10-CM | POA: Diagnosis not present

## 2022-12-29 DIAGNOSIS — R42 Dizziness and giddiness: Secondary | ICD-10-CM | POA: Diagnosis not present

## 2022-12-30 DIAGNOSIS — H903 Sensorineural hearing loss, bilateral: Secondary | ICD-10-CM | POA: Diagnosis not present

## 2023-01-09 DIAGNOSIS — F411 Generalized anxiety disorder: Secondary | ICD-10-CM | POA: Diagnosis not present

## 2023-02-28 DIAGNOSIS — L570 Actinic keratosis: Secondary | ICD-10-CM | POA: Diagnosis not present

## 2023-02-28 DIAGNOSIS — Z09 Encounter for follow-up examination after completed treatment for conditions other than malignant neoplasm: Secondary | ICD-10-CM | POA: Diagnosis not present

## 2023-02-28 DIAGNOSIS — L814 Other melanin hyperpigmentation: Secondary | ICD-10-CM | POA: Diagnosis not present

## 2023-02-28 DIAGNOSIS — L821 Other seborrheic keratosis: Secondary | ICD-10-CM | POA: Diagnosis not present
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
## 2023-03-23 ENCOUNTER — Other Ambulatory Visit: Payer: Medicare Other

## 2023-03-23 DIAGNOSIS — E7849 Other hyperlipidemia: Secondary | ICD-10-CM | POA: Diagnosis not present

## 2023-03-23 DIAGNOSIS — Z87898 Personal history of other specified conditions: Secondary | ICD-10-CM

## 2023-03-23 DIAGNOSIS — F418 Other specified anxiety disorders: Secondary | ICD-10-CM | POA: Diagnosis not present

## 2023-03-23 DIAGNOSIS — Z8659 Personal history of other mental and behavioral disorders: Secondary | ICD-10-CM

## 2023-03-23 DIAGNOSIS — Z125 Encounter for screening for malignant neoplasm of prostate: Secondary | ICD-10-CM | POA: Diagnosis not present

## 2023-03-23 DIAGNOSIS — Z13220 Encounter for screening for lipoid disorders: Secondary | ICD-10-CM

## 2023-03-23 NOTE — Addendum Note (Signed)
Addended by: Mary Sella D on: 03/23/2023 09:19 AM   Modules accepted: Orders

## 2023-03-24 ENCOUNTER — Other Ambulatory Visit: Payer: Medicare Other

## 2023-03-24 DIAGNOSIS — Z125 Encounter for screening for malignant neoplasm of prostate: Secondary | ICD-10-CM

## 2023-03-24 DIAGNOSIS — Z13220 Encounter for screening for lipoid disorders: Secondary | ICD-10-CM

## 2023-03-24 DIAGNOSIS — F418 Other specified anxiety disorders: Secondary | ICD-10-CM

## 2023-03-25 ENCOUNTER — Other Ambulatory Visit: Payer: Self-pay | Admitting: Internal Medicine

## 2023-03-27 ENCOUNTER — Ambulatory Visit (INDEPENDENT_AMBULATORY_CARE_PROVIDER_SITE_OTHER): Payer: Medicare Other | Admitting: Internal Medicine

## 2023-03-27 ENCOUNTER — Telehealth: Payer: Self-pay

## 2023-03-27 VITALS — BP 120/76 | HR 62 | Temp 98.2°F | Ht 70.25 in | Wt 180.4 lb

## 2023-03-27 DIAGNOSIS — Z8659 Personal history of other mental and behavioral disorders: Secondary | ICD-10-CM

## 2023-03-27 DIAGNOSIS — Z8619 Personal history of other infectious and parasitic diseases: Secondary | ICD-10-CM

## 2023-03-27 DIAGNOSIS — Z87828 Personal history of other (healed) physical injury and trauma: Secondary | ICD-10-CM

## 2023-03-27 DIAGNOSIS — Z0000 Encounter for general adult medical examination without abnormal findings: Secondary | ICD-10-CM

## 2023-03-27 DIAGNOSIS — Z136 Encounter for screening for cardiovascular disorders: Secondary | ICD-10-CM

## 2023-03-27 DIAGNOSIS — Z96651 Presence of right artificial knee joint: Secondary | ICD-10-CM

## 2023-03-27 DIAGNOSIS — R5383 Other fatigue: Secondary | ICD-10-CM | POA: Diagnosis not present

## 2023-03-27 DIAGNOSIS — Z8719 Personal history of other diseases of the digestive system: Secondary | ICD-10-CM

## 2023-03-27 DIAGNOSIS — Z96652 Presence of left artificial knee joint: Secondary | ICD-10-CM

## 2023-03-27 LAB — POCT URINALYSIS DIPSTICK
Bilirubin, UA: NEGATIVE
Blood, UA: NEGATIVE
Glucose, UA: NEGATIVE
Ketones, UA: NEGATIVE
Leukocytes, UA: NEGATIVE
Nitrite, UA: NEGATIVE
Protein, UA: NEGATIVE
Spec Grav, UA: 1.015 (ref 1.010–1.025)
Urobilinogen, UA: 0.2 E.U./dL
pH, UA: 5 (ref 5.0–8.0)

## 2023-03-27 NOTE — Telephone Encounter (Signed)
TSH, Free T4 was added spoke with Candace from [facility] to add labs.

## 2023-03-29 LAB — CBC WITH DIFFERENTIAL/PLATELET
Absolute Monocytes: 389 cells/uL (ref 200–950)
Basophils Absolute: 32 cells/uL (ref 0–200)
Basophils Relative: 0.6 %
Eosinophils Absolute: 130 cells/uL (ref 15–500)
Eosinophils Relative: 2.4 %
HCT: 45.5 % (ref 38.5–50.0)
Hemoglobin: 15.3 g/dL (ref 13.2–17.1)
Lymphs Abs: 864 cells/uL (ref 850–3900)
MCH: 29.7 pg (ref 27.0–33.0)
MCHC: 33.6 g/dL (ref 32.0–36.0)
MCV: 88.3 fL (ref 80.0–100.0)
MPV: 9.2 fL (ref 7.5–12.5)
Monocytes Relative: 7.2 %
Neutro Abs: 3985 cells/uL (ref 1500–7800)
Neutrophils Relative %: 73.8 %
Platelets: 246 10*3/uL (ref 140–400)
RBC: 5.15 10*6/uL (ref 4.20–5.80)
RDW: 13.5 % (ref 11.0–15.0)
Total Lymphocyte: 16 %
WBC: 5.4 10*3/uL (ref 3.8–10.8)

## 2023-03-29 LAB — COMPLETE METABOLIC PANEL WITH GFR
AG Ratio: 1.7 (calc) (ref 1.0–2.5)
ALT: 14 U/L (ref 9–46)
AST: 16 U/L (ref 10–35)
Albumin: 4.3 g/dL (ref 3.6–5.1)
Alkaline phosphatase (APISO): 66 U/L (ref 35–144)
BUN: 16 mg/dL (ref 7–25)
CO2: 30 mmol/L (ref 20–32)
Calcium: 9.9 mg/dL (ref 8.6–10.3)
Chloride: 102 mmol/L (ref 98–110)
Creat: 1.05 mg/dL (ref 0.70–1.28)
Globulin: 2.6 g/dL (calc) (ref 1.9–3.7)
Glucose, Bld: 91 mg/dL (ref 65–99)
Potassium: 4.7 mmol/L (ref 3.5–5.3)
Sodium: 140 mmol/L (ref 135–146)
Total Bilirubin: 0.5 mg/dL (ref 0.2–1.2)
Total Protein: 6.9 g/dL (ref 6.1–8.1)
eGFR: 76 mL/min/{1.73_m2} (ref >=60)

## 2023-03-29 LAB — LIPID PANEL
Cholesterol: 216 mg/dL — ABNORMAL HIGH (ref <200)
HDL: 74 mg/dL (ref >=40)
LDL Cholesterol (Calc): 120 mg/dL (calc) — ABNORMAL HIGH
Non-HDL Cholesterol (Calc): 142 mg/dL (calc) — ABNORMAL HIGH (ref <130)
Total CHOL/HDL Ratio: 2.9 (calc) (ref <5.0)
Triglycerides: 110 mg/dL (ref <150)

## 2023-03-29 LAB — PSA: PSA: 1.58 ng/mL (ref <=4.00)

## 2023-03-29 LAB — TSH: TSH: 1.87 mIU/L (ref 0.40–4.50)

## 2023-03-29 LAB — T4, FREE: Free T4: 1.2 ng/dL (ref 0.8–1.8)

## 2023-03-30 ENCOUNTER — Telehealth: Payer: Self-pay

## 2023-03-30 NOTE — Telephone Encounter (Signed)
Patient called to check on the status of Neurology referral,  I informed patient referral was placed just awaiting note for scheduling.

## 2023-03-31 ENCOUNTER — Telehealth: Payer: Self-pay | Admitting: Internal Medicine

## 2023-03-31 NOTE — Telephone Encounter (Signed)
Paul [name] [phone]  LVM to CB to schedule appt for labs --   B12, folate, TSH, Free T4, Fe TIBC, DX: Memory Loss

## 2023-03-31 NOTE — Telephone Encounter (Signed)
scheduled

## 2023-04-02 ENCOUNTER — Encounter: Payer: Self-pay | Admitting: Internal Medicine

## 2023-04-02 NOTE — Patient Instructions (Addendum)
Urgent referral will be made to Dr. Vickey Huger.  He history of head trauma from rockclimbing and falls.  Labs have been obtained to rule out B12 deficiency and hypothyroidism.  He has a history of depression treated with Cymbalta but that seems to be stable at the present time and is not likely to be the cause of his brain fogginess.

## 2023-04-03 ENCOUNTER — Other Ambulatory Visit: Payer: Medicare Other

## 2023-04-03 DIAGNOSIS — F410 Panic disorder [episodic paroxysmal anxiety] without agoraphobia: Secondary | ICD-10-CM | POA: Diagnosis not present

## 2023-04-03 DIAGNOSIS — D649 Anemia, unspecified: Secondary | ICD-10-CM | POA: Diagnosis not present

## 2023-04-03 DIAGNOSIS — R6889 Other general symptoms and signs: Secondary | ICD-10-CM | POA: Diagnosis not present

## 2023-04-03 DIAGNOSIS — R4189 Other symptoms and signs involving cognitive functions and awareness: Secondary | ICD-10-CM | POA: Diagnosis not present

## 2023-04-03 DIAGNOSIS — R413 Other amnesia: Secondary | ICD-10-CM

## 2023-04-04 ENCOUNTER — Ambulatory Visit: Payer: Medicare Other | Admitting: Neurology

## 2023-04-04 ENCOUNTER — Encounter: Payer: Self-pay | Admitting: Neurology

## 2023-04-04 ENCOUNTER — Telehealth: Payer: Self-pay | Admitting: Neurology

## 2023-04-04 VITALS — BP 130/77 | HR 70 | Ht 71.0 in | Wt 178.0 lb

## 2023-04-04 DIAGNOSIS — R251 Tremor, unspecified: Secondary | ICD-10-CM | POA: Diagnosis not present

## 2023-04-04 DIAGNOSIS — S0990XA Unspecified injury of head, initial encounter: Secondary | ICD-10-CM | POA: Insufficient documentation

## 2023-04-04 DIAGNOSIS — R292 Abnormal reflex: Secondary | ICD-10-CM | POA: Insufficient documentation

## 2023-04-04 DIAGNOSIS — S060X0D Concussion without loss of consciousness, subsequent encounter: Secondary | ICD-10-CM

## 2023-04-04 DIAGNOSIS — G309 Alzheimer's disease, unspecified: Secondary | ICD-10-CM

## 2023-04-04 DIAGNOSIS — S060X0A Concussion without loss of consciousness, initial encounter: Secondary | ICD-10-CM | POA: Insufficient documentation

## 2023-04-04 DIAGNOSIS — R42 Dizziness and giddiness: Secondary | ICD-10-CM

## 2023-04-04 LAB — B12 AND FOLATE PANEL
Folate: 11.5 ng/mL
Vitamin B-12: 614 pg/mL (ref 200–1100)

## 2023-04-04 LAB — IRON, TOTAL/TOTAL IRON BINDING CAP
%SAT: 38 % (calc) (ref 20–48)
Iron: 131 ug/dL (ref 50–180)
TIBC: 348 mcg/dL (calc) (ref 250–425)

## 2023-04-04 NOTE — Progress Notes (Signed)
[facility]  Provider:  Dr Hamsini Verrilli Referring Provider: Margaree Mackintosh, MD Primary Care Physician:  Margaree Mackintosh, MD  Chief Complaint  Patient presents with   New Patient (Initial Visit)    Pt alone, rm 2. Here for a referral from PCP to address memory concerns. He has had 3 concussions in a 6 month time span from Jan to Aug 2023. He notes things have improvement. He still continues to note his thinking is slow. He complains of low level dizziness. Sometimes vision can be blurry. He has been followed by ENT and eye MD for those concerns. Short term memory concerns.    HPI:  Paul [name] is a 72 y.o. male and seen here upon referral from PCP Dr. Lenord Fellers for a Consultation/ Evaluation of memory concerns. He has had 3 concussions in a 6 months time span from January to August 2023. He notes some improvement. He still feels his thinking is slow. He complains of latent low level dizziness, not always is this present. Sometimes his vision can be blurry. He has been followed by ENT and Ophthalmology for those concerns, and PCP -including short term memory concerns.   This patient reports onset of low level dizziness and memory problems over a period of 12 months, since a series of concussions while mountain biking and rock climbing: There is no headache associated and he hasn't had any headache in decades. History of intention tremor, preceding the TBI, and this was previously documented. Marland Kitchen     His low degree dizziness is a latent sensation , usually not affecting physical activity in daily living,  He was on a climbing tour in [place]- to  [place], [place] and [place] and while he was mountain climbing, he did develop significant balance problems. He felt he was drifting and "walking like a drunk".  He managed to finish all his goals, but he felt off-balance with a drift to the left sometimes and clumsy, more coarse in his movements.  Felt more clumsy even when he just  walked- but while at that altitude it did not affect him much, only when he returned to base.  He stayed 5 weeks total  in [place]. He had been placed on Buspar for anxiety , and Cymbalta was increased to 120 mg,  before he went on this trip- he reduced  cymbalta to now 90 mg while in [place] , and he reportedly couldn't think clearly on Buspar and weaned off completely.  He recalled he developed anxiety when his mother died, and started first on Prozac, later switched to Cymbalta. While he tried to wean off Cymbalta he got more anxious.    Remote history of marijuana use.  Alcohol use.  Not a smoker, 1-2 beers, ongoing alcohol use for the whole adult life. Adopted RN degree, now retired. 2 children.  Review of Systems: Out of a complete 14 system review, the patient complains of only the following symptoms, and all other reviewed systems are negative:   Latent dizziness.  Memory, STM . Anxiety. Depression     04/04/2023    1:42 PM  Montreal Cognitive Assessment   Visuospatial/ Executive (0/5) 5  Naming (0/3) 3  Attention: Read list of digits (0/2) 2  Attention: Read list of letters (0/1) 1  Attention: Serial 7 subtraction starting at 100 (0/3) 1  Language: Repeat phrase (0/2) 1  Language : Fluency (0/1) 1  Abstraction (0/2) 2  Delayed Recall (0/5) 2  Orientation (0/6) 5  Total 23  04/04/2023    1:45 PM  MMSE - Mini Mental State Exam  Orientation to time 4  Orientation to Place 5  Registration 3  Attention/ Calculation 5  Attention/Calculation-comments couldnt do the numbers  Recall 1  Language- name 2 objects 2  Language- repeat 1  Language- follow 3 step command 3  Language- read & follow direction 1  Write a sentence 1  Copy design 1  Total score 27     Social History   Socioeconomic History   Marital status: Divorced    Spouse name: Not on file   Number of children: Not on file   Years of education: Not on file   Highest education level:  Not on file  Occupational History   Occupation: nurse  Tobacco Use   Smoking status: Never   Smokeless tobacco: Never  Vaping Use   Vaping Use: Never used  Substance and Sexual Activity   Alcohol use: Yes    Comment: daily 2-3 beers    Drug use: Yes    Types: Marijuana    Comment: every day or two   Sexual activity: Yes  Other Topics Concern   Not on file  Social History Narrative   Not on file   Social Determinants of Health   Financial Resource Strain: Not on file  Food Insecurity: Not on file  Transportation Needs: Not on file  Physical Activity: Not on file  Stress: Not on file  Social Connections: Not on file  Intimate Partner Violence: Not on file    Family History  Adopted: Yes  Family history unknown: Yes    Past Medical History:  Diagnosis Date   Anxiety    Depression    Hepatitis C history    Hep C treated 12 years ago viral load negative after a year of treatment    Primary localized osteoarthritis of right knee 09/19/2019   S/P TKR (total knee replacement), left   11/12/2018 S/P   right , 2020  09/19/2019    Past Surgical History:  Procedure Laterality Date   REPLACEMENT TOTAL KNEE Left 11/12/2018   shouler arthroscopy     bilateral    TOTAL KNEE ARTHROPLASTY Right 09/30/2019   Procedure: TOTAL KNEE ARTHROPLASTY;  Surgeon: Salvatore Marvel, MD;  Location: [facility];  Service: Orthopedics;  Laterality: Right;    Current Outpatient Medications  Medication Sig Dispense Refill   ALPRAZolam (XANAX) 0.5 MG tablet Take 1 tablet (0.5 mg total) by mouth 2 (two) times daily as needed for anxiety. (Patient taking differently: Take 0.5 mg by mouth as needed for anxiety.) 60 tablet 3   DULoxetine (CYMBALTA) 60 MG capsule TAKE 1 CAPSULE BY MOUTH EVERY DAY (Patient taking differently: Take 90 mg by mouth daily.) 90 capsule 1   pantoprazole (PROTONIX) 40 MG tablet TAKE ONE TABLET BY MOUTH DAILY FOR GERD 90 tablet 3   sildenafil (VIAGRA) 100 MG tablet Take 50-100 mg  by mouth daily as needed for erectile dysfunction.     No current facility-administered medications for this visit.    Allergies as of 04/04/2023 - Review Complete 04/04/2023  Allergen Reaction Noted   Ciprofloxacin Other (See Comments) 05/17/2020   Flagyl [metronidazole] Other (See Comments) 05/17/2020    Vitals: BP 130/77   Pulse 70   Ht 5\' 11"  (1.803 m)   Wt 178 lb (80.7 kg)   BMI 24.83 kg/m  Last Weight:  Wt Readings from Last 1 Encounters:  04/04/23 178 lb (80.7 kg)   Last Height:  Ht Readings from Last 1 Encounters:  04/04/23 5\' 11"  (1.803 m)   24. 83 BMI   Physical exam:  General: The patient is awake, alert and appears not in acute distress.  The patient is well groomed. He is tanned, trim and has no edema.  Head: Normocephalic, atraumatic.  Neck  supple Cardiovascular:  Regular rate and palpable peripheral pulse:  Respiratory: clear to auscultation.  Mallampati 2, Skin:  Without evidence of edema, or rash   Neurologic exam : The patient is awake and alert, oriented to place and time.  Memory subjective described as impaired for STM.  There is a normal attention span & concentration ability.  Speech is fluent without dysarthria, but there is dysphonia. Vocal tremor/ hoarse ness.  Mood and affect are appropriate.  Cranial nerves: Pupils are equal and briskly reactive to light. Funduscopic exam without evidence of pallor or edema Extraocular movements  in vertical and horizontal planes intact and without nystagmus. Visual fields by finger perimetry are intact. Hearing to finger rub impaired, Rinne - Weber maneuver indicated right sided stronger bone conduction. Facial sensation intact to fine touch.  Facial motor strength is symmetric and tongue and uvula move midline.titubation.   Motor exam:   Normal  muscle bulk and symmetric normal strength in all extremities. Grip Strength symmetric, strong. There was some cogwheeling over the left biceps.  Right  biceps was relaxed.  Proximal strength of shoulder muscles and hip flexors was strong, no rigor here .  Sensory:  Fine touch and vibration were tested and normal. Proprioception was tested in the upper extremities only and was normal.  Coordination: Rapid alternating movements in the fingers/hands were slightly slower than expected for his career and the required dexterity.   Finger-to-nose maneuver was without evidence of ataxia, dysmetria - but action/ intention tremor.  Holding both arms outstretched made the tremor more visible- on both sides. .   Gait and station: Patient walked without assistive device.  Core Strength within normal limits.  Stance is stable and of normal base.  Tandem gait is intact , turns with 3-4 Steps are unfragmented.  Toe walk intact,  Romberg testing is =normal.  Deep tendon reflexes: in the  upper and lower extremities are symmetric and  brisk , but without Clonus. Babinski maneuver response is up-going on the left    Assessment: Total time for face to face interview and examination, for review of  images and laboratory testing, neurophysiology testing and pre-existing records, including out-of -network , was 45 minutes. 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   1)  Neurocognitive impairment by [facility] testing - STM is clearly affected.   There is some hearing loss, but conversation was unimpaired.  Cataracts are present , not yet mature for surgery.  2)  Tremor is of mixed quality- there is intention and action.  3)  Brisk lower extremity reflexes, hyperreflexia without clonus. No parkinsonian features of gait and balance.   Plan:  Treatment plan and additional workup planned after today includes:   1) MRI brain with and without contrast. Rule out vascular events.  Ruling out  atrophy, old SDH etc.   2)  hyperreflexia-  rule out spinal stenosis. Low back problems were reported , but he is not in a chronic pain situation.  Fingers are tingling- either from shoulder  left or cervical stenosis.  MRI cervical and lumbar .   3) labs reviewed. Ongoing high cholesterol for 7 years. Normal PSA CMET, CBC diff. Hepatitis C treated. TSH  normal. I added OTC recommendation for intake of B complex,  Vit D, magnesium.   Will meet after 4-6 months again, dependent on results. I have referred to neuropsychology as well, we can repeat MOCA/ MMSE at that visit if he hasn't been seen yet for more detailed testing with neuropsychologist.  Melvyn Novas, MD

## 2023-04-04 NOTE — Telephone Encounter (Signed)
MRI brain and cervical spine [employer] medicare auth: [id number] exp. 04/04/23-05/03/23  CT cervical spine [employer] medicare auth: [id number] exp. 04/04/23-05/03/23 sent to GI [phone]

## 2023-04-04 NOTE — Patient Instructions (Signed)
Post-Concussion Syndrome  A concussion is a brain injury from a direct hit to the head or body. This hit causes the brain to shake back and forth fast inside the skull. The shaking can damage brain cells and cause chemical changes in the brain. Concussions are normally not life-threatening but can cause serious symptoms. Post-concussion syndrome is when symptoms that happen after a concussion last longer than normal. These symptoms can last from weeks to months. What are the causes? The cause of this condition is not known. It can happen whether your head injury was mild or severe. What increases the risk? You are more likely to get this condition if: You are male. You are a child, teen, or young adult. You have had a head injury before. You have a history of headaches. You have depression or anxiety. You have more than one symptom or severe symptoms when your concussion occurs. You faint or cannot remember the event (have amnesia of the event). What are the signs or symptoms? Symptoms of this condition include: Physical symptoms, such as: Headaches. Tiredness. Dizziness and weakness. Blurred vision and sensitivity to light. Trouble hearing. Problems with balance. Mental and emotional symptoms, such as: Memory problems and trouble focusing. Trouble falling asleep or staying asleep (insomnia). Feeling irritable. Anxiety or depression. Trouble learning new things. How is this diagnosed? This condition may be diagnosed based on: Your symptoms. A description of your injury. Your medical history. Testing your strength, balance, and nerve function (neurological exam). Your health care provider may order other tests. These may include: Brain imaging, such as a CT scan or an MRI. Memory testing (neuropsychological testing). How is this treated? Treatment for this condition may depend on your symptoms. Symptoms normally go away on their own with time. If you need treatment, it may  include: Medicines for headaches, anxiety, depression, and insomnia. Resting your brain and body for a few days after your injury. Rehab therapy, such as: Physical or occupational therapy. This may include exercises to help with balance and dizziness. Mental health counseling. A form of talk therapy called cognitive behavioral therapy (CBT) can be especially helpful. This therapy helps you set goals and follow up on the changes that you make. Speech therapy. Vision therapy. A brain and eye specialist can recommend treatments for vision problems. Follow these instructions at home: Medicines Take over-the-counter and prescription medicines only as told by your health care provider. Avoid opioid prescription pain medicines when getting over a concussion. Activity Limit your mental activities for the first few days after your injury. This may include not doing these things: Homework or work for your job. Complex thinking. Watching TV. Using a computer or phone. Playing memory games and puzzles. Slowly return to your normal activity level. If a certain activity brings on your symptoms, stop or slow down until you can do the activity without getting symptoms again. Limit physical activity, such as sports or strenuous activities, for the first few days after a concussion. Slowly return to normal activity as told by your health care provider. Light exercise may be helpful. Rest helps your brain heal. Make sure you: Get plenty of sleep at night. Most adults should get at least 7-9 hours of sleep each night. Rest during the day. Take naps or rest breaks when you feel tired. Do not do high-risk activities that could cause a second concussion, such as riding a bike or playing sports. Having another concussion before the first one has healed can be harmful. General instructions  Do   not drink alcohol until your health care provider says that you can. Keep track of how severe your symptoms are and how  often they happen. Give this information to your health care provider. Keep all follow-up visits. Your health care provider may need to check you for new or serious symptoms. Where to find more information [facility] [facility]: concussionfoundation.org Contact a health care provider if: Your symptoms do not improve. You get injured again. You have unusual behavior changes. Get help right away if: You have a severe or worsening headache. You have weakness or numbness in any part of your body. You vomit repeatedly. You have mental status changes, such as: Confusion. Trouble speaking. Trouble staying awake. Fainting. You have a seizure. These symptoms may be an emergency. Get help right away. Call 911. Do not wait to see if the symptoms will go away. Do not drive yourself to the hospital. Also, get help right away if: You think about hurting yourself or others. Take one of these steps if you feel like you may hurt yourself or others, or have thoughts about taking your own life: Go to your nearest emergency room. Call 911. Call the National Suicide Prevention Lifeline at 1-800-273-8255 or 988. This is open 24 hours a day. Text the Crisis Text Line at 741741. This information is not intended to replace advice given to you by your health care provider. Make sure you discuss any questions you have with your health care provider. Document Revised: 04/01/2022 Document Reviewed: 04/01/2022 Elsevier Patient Education  2023 [employer]  

## 2023-04-14 ENCOUNTER — Ambulatory Visit
Admission: RE | Admit: 2023-04-14 | Discharge: 2023-04-14 | Disposition: A | Discharge status: Home / Self Care | Payer: Medicare Other | Source: Ambulatory Visit | Attending: Neurology | Admitting: Neurology

## 2023-04-14 DIAGNOSIS — G309 Alzheimer's disease, unspecified: Secondary | ICD-10-CM | POA: Diagnosis not present

## 2023-04-14 DIAGNOSIS — R42 Dizziness and giddiness: Secondary | ICD-10-CM

## 2023-04-14 DIAGNOSIS — R251 Tremor, unspecified: Secondary | ICD-10-CM

## 2023-04-14 DIAGNOSIS — S0990XA Unspecified injury of head, initial encounter: Secondary | ICD-10-CM

## 2023-04-14 DIAGNOSIS — R292 Abnormal reflex: Secondary | ICD-10-CM

## 2023-04-14 DIAGNOSIS — R270 Ataxia, unspecified: Secondary | ICD-10-CM | POA: Diagnosis not present

## 2023-04-14 DIAGNOSIS — F0390 Unspecified dementia without behavioral disturbance: Secondary | ICD-10-CM | POA: Diagnosis not present

## 2023-04-14 DIAGNOSIS — S060X0D Concussion without loss of consciousness, subsequent encounter: Secondary | ICD-10-CM

## 2023-04-14 MED ORDER — GADOPICLENOL 0.5 MMOL/ML IV SOLN
8.0000 mL | Freq: Once | INTRAVENOUS | Status: AC | PRN
  Administered 2023-04-14: 8 mL via INTRAVENOUS

## 2023-04-20 ENCOUNTER — Telehealth: Payer: Self-pay | Admitting: Diagnostic Neuroimaging

## 2023-04-20 ENCOUNTER — Encounter: Payer: Self-pay | Admitting: Neurology

## 2023-04-20 DIAGNOSIS — R270 Ataxia, unspecified: Secondary | ICD-10-CM

## 2023-04-20 DIAGNOSIS — R292 Abnormal reflex: Secondary | ICD-10-CM

## 2023-04-20 DIAGNOSIS — S060X0D Concussion without loss of consciousness, subsequent encounter: Secondary | ICD-10-CM

## 2023-04-20 DIAGNOSIS — R42 Dizziness and giddiness: Secondary | ICD-10-CM

## 2023-04-20 NOTE — Telephone Encounter (Signed)
Referral faxed to [facility]: Phone: [phone]  Fax:[phone] 

## 2023-04-25 DIAGNOSIS — M47812 Spondylosis without myelopathy or radiculopathy, cervical region: Secondary | ICD-10-CM | POA: Diagnosis not present

## 2023-04-25 DIAGNOSIS — G5622 Lesion of ulnar nerve, left upper limb: Secondary | ICD-10-CM | POA: Diagnosis not present

## 2023-05-01 ENCOUNTER — Ambulatory Visit
Admission: RE | Admit: 2023-05-01 | Discharge: 2023-05-01 | Disposition: A | Discharge status: Home / Self Care | Payer: Medicare Other | Source: Ambulatory Visit | Attending: Neurology | Admitting: Neurology

## 2023-05-01 DIAGNOSIS — S060X0D Concussion without loss of consciousness, subsequent encounter: Secondary | ICD-10-CM

## 2023-05-01 DIAGNOSIS — R251 Tremor, unspecified: Secondary | ICD-10-CM

## 2023-05-01 DIAGNOSIS — R270 Ataxia, unspecified: Secondary | ICD-10-CM

## 2023-05-01 DIAGNOSIS — M5126 Other intervertebral disc displacement, lumbar region: Secondary | ICD-10-CM | POA: Diagnosis not present

## 2023-05-01 DIAGNOSIS — R42 Dizziness and giddiness: Secondary | ICD-10-CM

## 2023-05-01 DIAGNOSIS — G309 Alzheimer's disease, unspecified: Secondary | ICD-10-CM

## 2023-05-01 DIAGNOSIS — M47816 Spondylosis without myelopathy or radiculopathy, lumbar region: Secondary | ICD-10-CM | POA: Diagnosis not present

## 2023-05-01 DIAGNOSIS — M48061 Spinal stenosis, lumbar region without neurogenic claudication: Secondary | ICD-10-CM | POA: Diagnosis not present

## 2023-05-01 DIAGNOSIS — R292 Abnormal reflex: Secondary | ICD-10-CM

## 2023-05-11 ENCOUNTER — Encounter: Payer: Self-pay | Admitting: Neurology

## 2023-06-27 DIAGNOSIS — M25511 Pain in right shoulder: Secondary | ICD-10-CM | POA: Diagnosis not present

## 2023-06-27 DIAGNOSIS — M25512 Pain in left shoulder: Secondary | ICD-10-CM | POA: Diagnosis not present

## 2023-07-19 DIAGNOSIS — H524 Presbyopia: Secondary | ICD-10-CM | POA: Diagnosis not present

## 2023-07-28 DIAGNOSIS — M25511 Pain in right shoulder: Secondary | ICD-10-CM | POA: Diagnosis not present

## 2023-08-05 DIAGNOSIS — M25511 Pain in right shoulder: Secondary | ICD-10-CM | POA: Diagnosis not present

## 2023-08-14 IMAGING — CT CT MAXILLOFACIAL W/O CM
3 series · 16 of 47 positions shown, 19 images · non-contrast
Comparison: None.

CLINICAL DATA: Head trauma, moderate-severe; Facial trauma, blunt
left facial, periorbital injury

EXAM:
CT HEAD WITHOUT CONTRAST
CT MAXILLOFACIAL WITHOUT CONTRAST
TECHNIQUE: Multidetector CT imaging of the head and maxillofacial structures
were performed using the standard protocol without intravenous
contrast. Multiplanar CT image reconstructions of the maxillofacial
structures were also generated.
RADIATION DOSE REDUCTION: This exam was performed according to the
departmental dose-optimization program which includes automated
exposure control, adjustment of the mA and/or kV according to
patient size and/or use of iterative reconstruction technique.

[Series 2: 1 max soft · axial · 0.35mm/px · z∈[+912,+1062]mm · 10 of 88 slices shown, 13 images]
[im 7/88  brain]
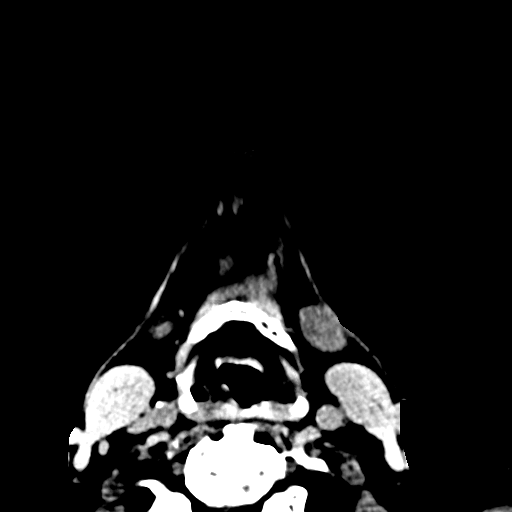
[im 7/88  bone]
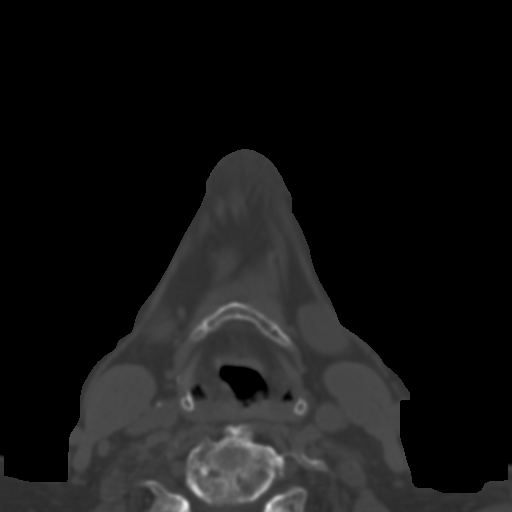
[im 16/88  bone]
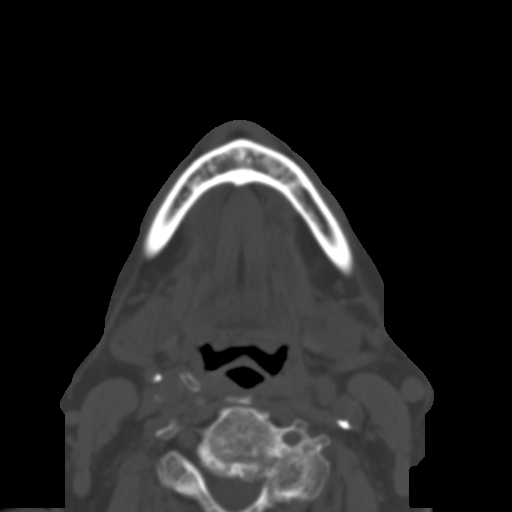
[im 25/88  bone]
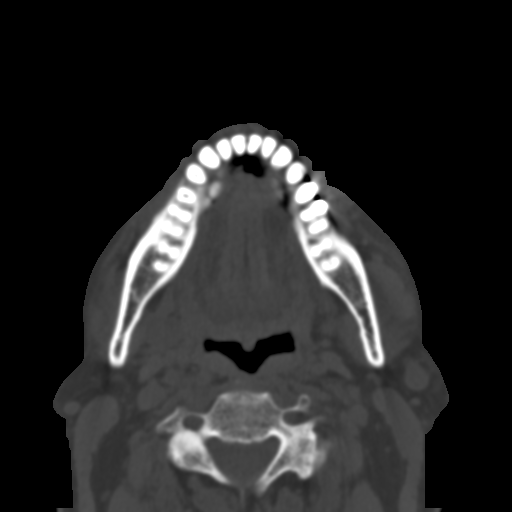
[im 31/88  bone]
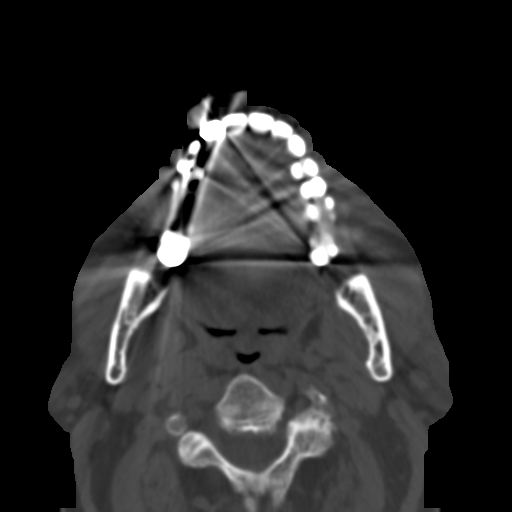
[im 40/88  brain]
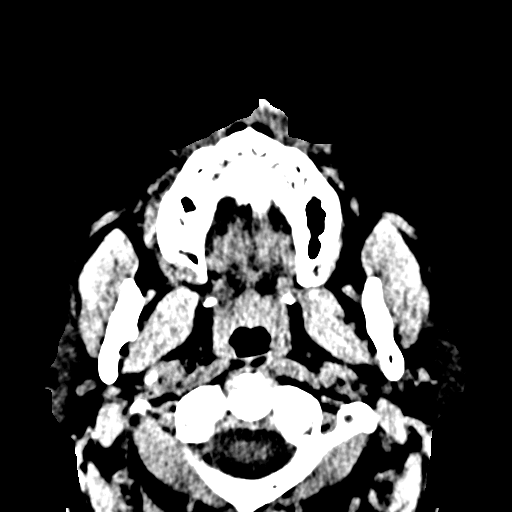
[im 40/88  bone]
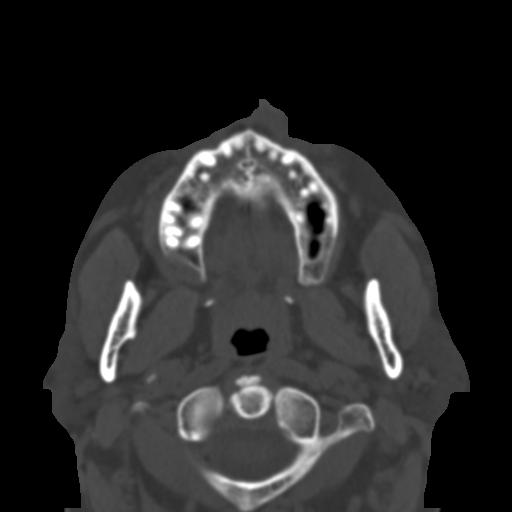
[im 49/88  bone]
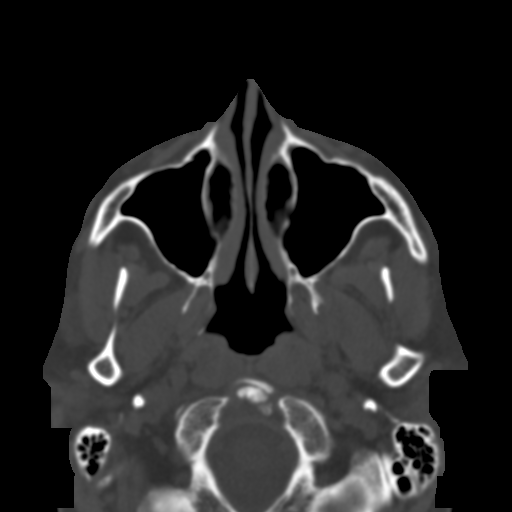
[im 58/88  bone]
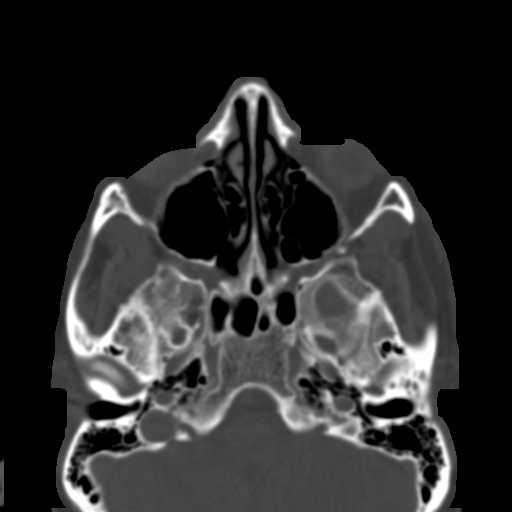
[im 67/88  bone]
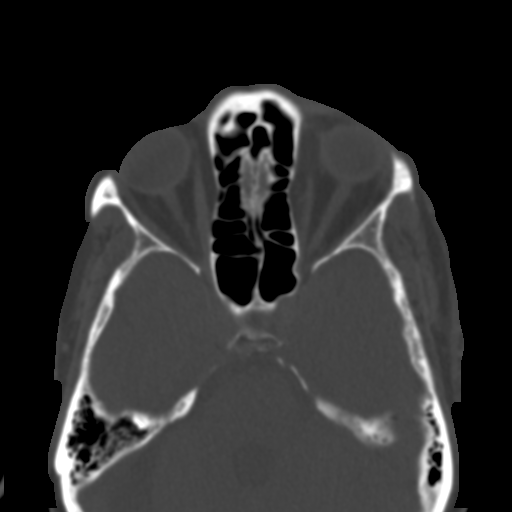
[im 73/88  brain]
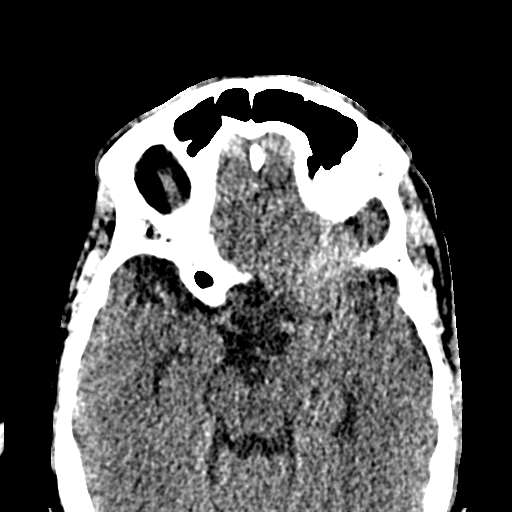
[im 73/88  bone]
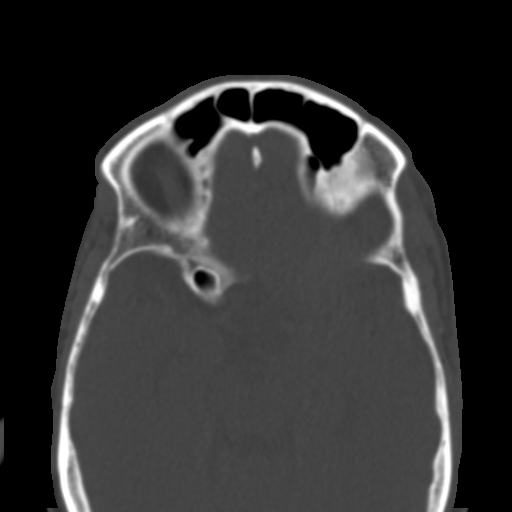
[im 82/88  bone]
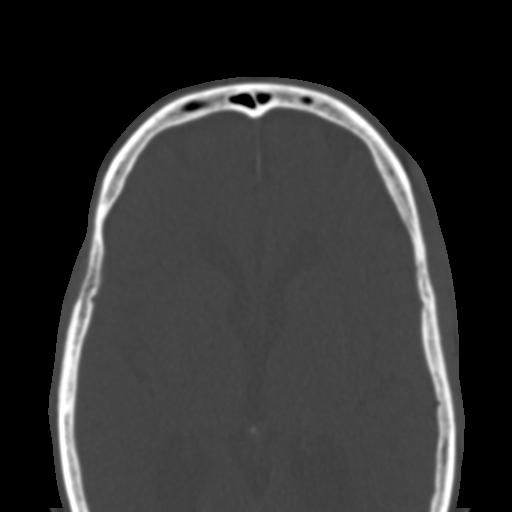

[Series 6: coronal soft · coronal · 0.34mm/px · 3 of 104 slices shown]
[im 35/104  bone]
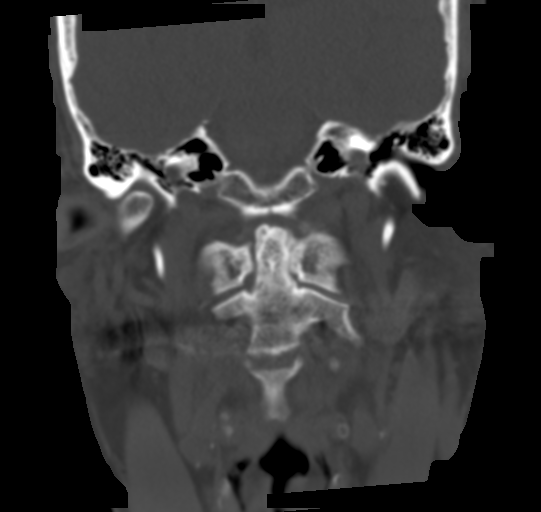
[im 46/104  bone]
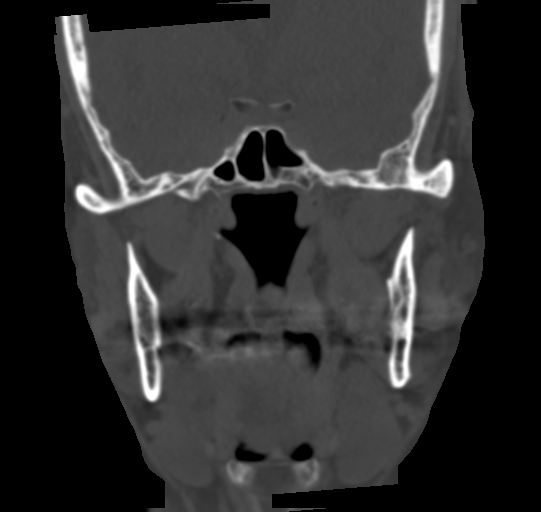
[im 58/104  bone]
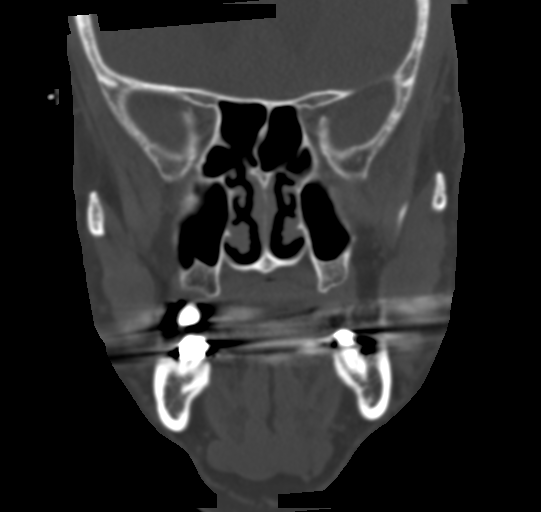

[Series 7: sagittal soft · sagittal · 0.37mm/px · 3 of 92 slices shown]
[im 31/92  bone]
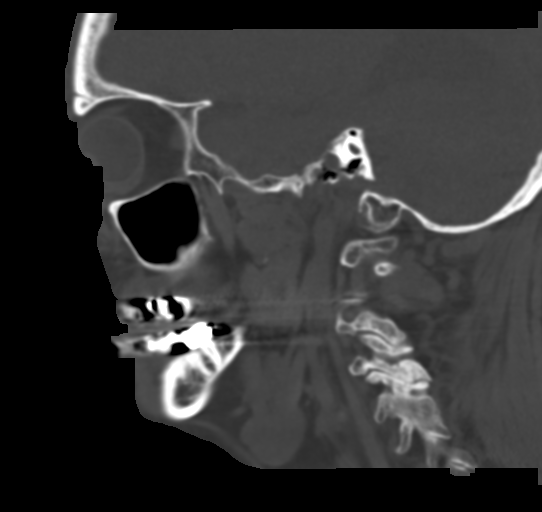
[im 46/92  bone]
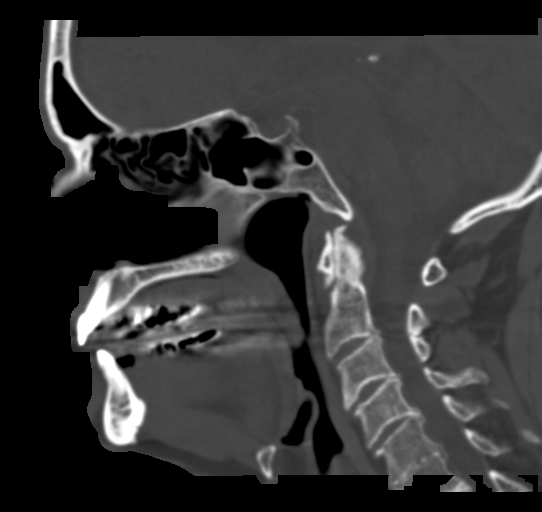
[im 61/92  bone]
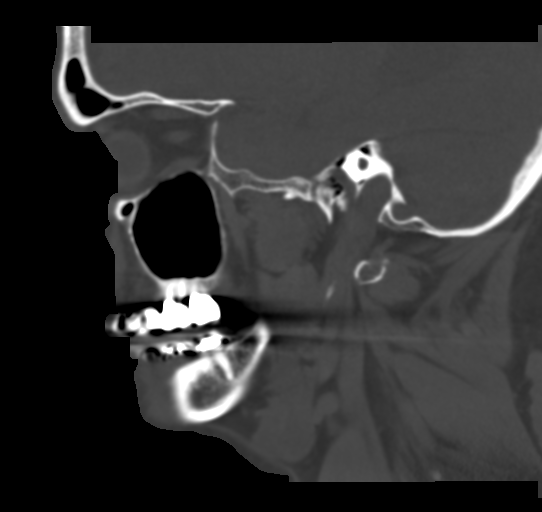

[16 of 47 positions shown; findings below may reference images not displayed]

FINDINGS: CT HEAD FINDINGS

Brain: No evidence of acute intracranial hemorrhage or extra-axial
collection.No evidence of mass lesion/concern mass effect.The
ventricles are normal in size.

Vascular: No hyperdense vessel or unexpected calcification.

Skull: Normal. Negative for fracture or focal lesion.

Other: Small left frontotemporal scalp hematoma.

CT MAXILLOFACIAL FINDINGS

Osseous: No fracture or mandibular dislocation. No destructive
process.

Orbits: Negative. No traumatic or inflammatory finding.

Sinuses: Minimal paranasal sinus mucosal thickening.

Soft tissues: Left periorbital soft tissue swelling.
IMPRESSION: No acute intracranial abnormality.  No acute facial fracture.

Small left frontotemporal scalp hematoma. Left periorbital soft
tissue swelling.

## 2023-08-15 ENCOUNTER — Ambulatory Visit: Payer: Medicare Other | Admitting: Neurology

## 2023-08-15 ENCOUNTER — Encounter: Payer: Self-pay | Admitting: Neurology

## 2023-08-15 VITALS — BP 124/84 | HR 65 | Ht 71.0 in | Wt 176.0 lb

## 2023-08-15 DIAGNOSIS — G3184 Mild cognitive impairment, so stated: Secondary | ICD-10-CM | POA: Diagnosis not present

## 2023-08-15 DIAGNOSIS — R270 Ataxia, unspecified: Secondary | ICD-10-CM | POA: Diagnosis not present

## 2023-08-15 DIAGNOSIS — S0990XA Unspecified injury of head, initial encounter: Secondary | ICD-10-CM

## 2023-08-15 NOTE — Patient Instructions (Signed)
Post-Concussion Syndrome  A concussion is a brain injury from a direct hit to the head or body. This hit causes the brain to shake back and forth fast inside the skull. The shaking can damage brain cells and cause chemical changes in the brain. Concussions are normally not life-threatening but can cause serious symptoms. Post-concussion syndrome is when symptoms that happen after a concussion last longer than normal. These symptoms can last from weeks to months. What are the causes? The cause of this condition is not known. It can happen whether your head injury was mild or severe. What increases the risk? You are more likely to get this condition if: You are male. You are a child, teen, or young adult. You have had a head injury before. You have a history of headaches. You have depression or anxiety. You have more than one symptom or severe symptoms when your concussion occurs. You faint or cannot remember the event (have amnesia of the event). What are the signs or symptoms? Symptoms of this condition include: Physical symptoms, such as: Headaches. Tiredness. Dizziness and weakness. Blurred vision and sensitivity to light. Trouble hearing. Problems with balance. Mental and emotional symptoms, such as: Memory problems and trouble focusing. Trouble falling asleep or staying asleep (insomnia). Feeling irritable. Anxiety or depression. Trouble learning new things. How is this diagnosed? This condition may be diagnosed based on: Your symptoms. A description of your injury. Your medical history. Testing your strength, balance, and nerve function (neurological exam). Your health care provider may order other tests. These may include: Brain imaging, such as a CT scan or an MRI. Memory testing (neuropsychological testing). How is this treated? Treatment for this condition may depend on your symptoms. Symptoms normally go away on their own with time. If you need treatment, it may  include: Medicines for headaches, anxiety, depression, and insomnia. Resting your brain and body for a few days after your injury. Rehab therapy, such as: Physical or occupational therapy. This may include exercises to help with balance and dizziness. Mental health counseling. A form of talk therapy called cognitive behavioral therapy (CBT) can be especially helpful. This therapy helps you set goals and follow up on the changes that you make. Speech therapy. Vision therapy. A brain and eye specialist can recommend treatments for vision problems. Follow these instructions at home: Medicines Take over-the-counter and prescription medicines only as told by your health care provider. Avoid opioid prescription pain medicines when getting over a concussion. Activity Limit your mental activities for the first few days after your injury. This may include not doing these things: Homework or work for your job. Complex thinking. Watching TV. Using a computer or phone. Playing memory games and puzzles. Slowly return to your normal activity level. If a certain activity brings on your symptoms, stop or slow down until you can do the activity without getting symptoms again. Limit physical activity, such as sports or strenuous activities, for the first few days after a concussion. Slowly return to normal activity as told by your health care provider. Light exercise may be helpful. Rest helps your brain heal. Make sure you: Get plenty of sleep at night. Most adults should get at least 7-9 hours of sleep each night. Rest during the day. Take naps or rest breaks when you feel tired. Do not do high-risk activities that could cause a second concussion, such as riding a bike or playing sports. Having another concussion before the first one has healed can be harmful. General instructions  Do  not drink alcohol until your health care provider says that you can. Keep track of how severe your symptoms are and how  often they happen. Give this information to your health care provider. Keep all follow-up visits. Your health care provider may need to check you for new or serious symptoms. Where to find more information [employer] [facility]: concussionfoundation.org Contact a health care provider if: Your symptoms do not improve. You get injured again. You have unusual behavior changes. Get help right away if: You have a severe or worsening headache. You have weakness or numbness in any part of your body. You vomit repeatedly. You have mental status changes, such as: Confusion. Trouble speaking. Trouble staying awake. Fainting. You have a seizure. These symptoms may be an emergency. Get help right away. Call 911. Do not wait to see if the symptoms will go away. Do not drive yourself to the hospital. Also, get help right away if: You think about hurting yourself or others. Take one of these steps if you feel like you may hurt yourself or others, or have thoughts about taking your own life: Go to your nearest emergency room. Call 911. Call the National Suicide Prevention Lifeline at 9412510011 or 988. This is open 24 hours a day. Text the Crisis Text Line at (215) 750-0932. This information is not intended to replace advice given to you by your health care provider. Make sure you discuss any questions you have with your health care provider. Document Revised: 04/01/2022 Document Reviewed: 04/01/2022 Elsevier Patient Education  2024 [employer].

## 2023-08-15 NOTE — Progress Notes (Signed)
Provider:  Melvyn Novas, MD  Primary Care Physician:  Margaree Mackintosh, MD [address] [place] [address]     Referring Provider: Margaree Mackintosh, Md [address] [place],  [place] [address]          Chief Complaint according to patient   Patient presents with:                HISTORY OF PRESENT ILLNESS:  Paul [name] is a 72 y.o. male patient who is here for revisit 08/15/2023 for post concussion and ST-memory loss. CLINICAL DATA: 72 year old male with persistent dizziness, balance issues. Tremor. 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   Chief concern according to patient : " I have a terrible neck spine but there is no pain, it has explained the tingling in th left hand. Dr Yetta Barre ( neurosurgeon)  felt no need to intervene at this time". My memory is not changing for now.   This MRI of the brain with and without contrast shows the following: Mild generalized cortical atrophy without a lobar pattern, similar to the CT scan from 01/06/2022. Small number of T2/FLAIR hypertense foci in the subcortical and deep white matter of the cerebral hemispheres and 1 focus in the pons.  This is most consistent with mild chronic microvascular ischemic change, fairly typical for age.  None of the foci appear to be acute. No acute findings.  Normal enhancement pattern.         INTERPRETING PHYSICIAN:  Richard A. Epimenio Foot, MD, PhD, FAAN Certified in  Neuroimaging by [employer]           Paul [name] is a 72 y.o. male and seen here upon referral from PCP Dr. Lenord Fellers for a Consultation/ Evaluation of memory concerns. He has had 3 concussions in a 6 months time span from January to August 2023. He notes some improvement. He still feels his thinking is slow. He complains of latent low level dizziness, not always is this present. Sometimes his vision can be blurry. He has been followed by ENT and Ophthalmology for those concerns, and PCP  -including short term memory concerns.    This patient reports onset of low level dizziness and memory problems over a period of 12 months, since a series of concussions while mountain biking and rock climbing: There is no headache associated and he hasn't had any headache in decades. History of intention tremor, preceding the TBI, and this was previously documented. Marland Kitchen      His low degree dizziness is a latent sensation , usually not affecting physical activity in daily living,  He was on a climbing tour in [place]- to  [place], [place] and [place] and while he was mountain climbing, he did develop significant balance problems. He felt he was drifting and "walking like a drunk".  He managed to finish all his goals, but he felt off-balance with a drift to the left sometimes and clumsy, more coarse in his movements.  Felt more clumsy even when he just walked- but while at that altitude it did not affect him much, only when he returned to base.  He stayed 5 weeks total  in [place]. He had been placed on Buspar for anxiety , and Cymbalta was increased to 120 mg,  before he went on this trip- he reduced  cymbalta to now 90 mg while in [place] , and he reportedly couldn't think clearly on Buspar and weaned off completely.  He recalled he developed anxiety when his mother died, and started first on Prozac, later switched to Cymbalta. While he tried to wean off Cymbalta he got more anxious.      Remote history of marijuana use.  Alcohol use.  Not a smoker, 1-2 beers, ongoing alcohol use for the whole adult life. Adopted RN degree, now retired. 2 children.       Review of Systems: Out of a complete 14 system review, the patient complains of only the following symptoms, and all other reviewed systems are negative.:  Latent dizziness.  Memory, STM . Anxiety. Depression       08/15/2023    2:28 PM 04/04/2023    1:42 PM  Montreal Cognitive Assessment   Visuospatial/ Executive (0/5)  5 5  Naming (0/3) 3 3  Attention: Read list of digits (0/2) 2 2  Attention: Read list of letters (0/1) 1 1  Attention: Serial 7 subtraction starting at 100 (0/3) 3 1  Language: Repeat phrase (0/2) 1 1  Language : Fluency (0/1) 1 1  Abstraction (0/2) 2 2  Delayed Recall (0/5) 1 2  Orientation (0/6) 5 5  Total 24 23    Social History   Socioeconomic History   Marital status: Divorced    Spouse name: Not on file   Number of children: Not on file   Years of education: Not on file   Highest education level: Not on file  Occupational History   Occupation: nurse  Tobacco Use   Smoking status: Never   Smokeless tobacco: Never  Vaping Use   Vaping status: Never Used  Substance and Sexual Activity   Alcohol use: Yes    Comment: daily 2-3 beers    Drug use: Yes    Types: Marijuana    Comment: every day or two   Sexual activity: Yes  Other Topics Concern   Not on file  Social History Narrative   Not on file   Social Determinants of Health   Financial Resource Strain: Not on file  Food Insecurity: Not on file  Transportation Needs: Not on file  Physical Activity: Not on file  Stress: Not on file  Social Connections: Not on file    Family History  Adopted: Yes  Family history unknown: Yes    Past Medical History:  Diagnosis Date   Anxiety    Depression    Hepatitis    Hep C treated 12 years ago viral load negative after a year of treatment    Primary localized osteoarthritis of right knee 09/19/2019   S/P TKR (total knee replacement), left   11/12/2018 09/19/2019    Past Surgical History:  Procedure Laterality Date   REPLACEMENT TOTAL KNEE Left 11/12/2018   shouler arthroscopy     bilateral    TOTAL KNEE ARTHROPLASTY Right 09/30/2019   Procedure: TOTAL KNEE ARTHROPLASTY;  Surgeon: Salvatore Marvel, MD;  Location: [facility];  Service: Orthopedics;  Laterality: Right;     Current Outpatient Medications on File Prior to Visit  Medication Sig Dispense Refill    ALPRAZolam (XANAX) 0.5 MG tablet Take 1 tablet (0.5 mg total) by mouth 2 (two) times daily as needed for anxiety. (Patient taking differently: Take 0.5 mg by mouth as needed for anxiety.) 60 tablet 3   DULoxetine (CYMBALTA) 60 MG capsule TAKE 1 CAPSULE BY MOUTH EVERY DAY (Patient taking differently: Take 90 mg by mouth daily.) 90 capsule 1   pantoprazole (PROTONIX) 40 MG tablet TAKE ONE TABLET BY MOUTH DAILY  FOR GERD 90 tablet 3   sildenafil (VIAGRA) 100 MG tablet Take 50-100 mg by mouth daily as needed for erectile dysfunction.     No current facility-administered medications on file prior to visit.    Allergies  Allergen Reactions   Ciprofloxacin Other (See Comments)    Pt states with his Cymbalta it makes his depression worst   Flagyl [Metronidazole] Other (See Comments)    Pt states when he takes with his Cymbalta it makes his depression worse.      DIAGNOSTIC DATA (LABS, IMAGING, TESTING) - I reviewed patient records, labs, notes, testing and imaging myself where available.  Lab Results  Component Value Date   WBC 5.4 03/23/2023   HGB 15.3 03/23/2023   HCT 45.5 03/23/2023   MCV 88.3 03/23/2023   PLT 246 03/23/2023      Component Value Date/Time   NA 140 03/23/2023 1225   K 4.7 03/23/2023 1225   CL 102 03/23/2023 1225   CO2 30 03/23/2023 1225   GLUCOSE 91 03/23/2023 1225   BUN 16 03/23/2023 1225   CREATININE 1.05 03/23/2023 1225   CALCIUM 9.9 03/23/2023 1225   PROT 6.9 03/23/2023 1225   ALBUMIN 3.9 05/16/2020 1754   AST 16 03/23/2023 1225   ALT 14 03/23/2023 1225   ALKPHOS 62 05/16/2020 1754   BILITOT 0.5 03/23/2023 1225   GFRNONAA 83 03/18/2021 0950   GFRAA 96 03/18/2021 0950   Lab Results  Component Value Date   CHOL 216 (H) 03/23/2023   HDL 74 03/23/2023   LDLCALC 120 (H) 03/23/2023   TRIG 110 03/23/2023   CHOLHDL 2.9 03/23/2023   Lab Results  Component Value Date   HGBA1C 5.3 03/18/2021   Lab Results  Component Value Date   VITAMINB12 614 04/03/2023    Lab Results  Component Value Date   TSH 1.87 03/23/2023    PHYSICAL EXAM:  Today's Vitals   08/15/23 1425  BP: 124/84  Pulse: 65  Weight: 176 lb (79.8 kg)  Height: 5\' 11"  (1.803 m)   Body mass index is 24.55 kg/m.   Wt Readings from Last 3 Encounters:  08/15/23 176 lb (79.8 kg)  04/04/23 178 lb (80.7 kg)  03/27/23 180 lb 6.4 oz (81.8 kg)     Ht Readings from Last 3 Encounters:  08/15/23 5\' 11"  (1.803 m)  04/04/23 5\' 11"  (1.803 m)  03/27/23 5' 10.25" (1.784 m)      General: The patient is awake, alert and appears not in acute distress.  Head: Normocephalic, atraumatic. Neck is with mildy reuction I ROM.  Cardiovascular:  Regular rate and cardiac rhythm by pulse,  without distended neck veins. Respiratory: Lungs are clear to auscultation.  Skin:  Without evidence of ankle edema, or rash. Trunk: The patient's posture is erect.   NEUROLOGIC EXAM: The patient is awake and alert, oriented to place and time.   Memory subjective described as intact.   04/04/2023    1:42 PM   Montreal Cognitive Assessment   Visuospatial/ Executive (0/5) 5  Naming (0/3) 3  Attention: Read list of digits (0/2) 2  Attention: Read list of letters (0/1) 1  Attention: Serial 7 subtraction starting at 100 (0/3) 1  Language: Repeat phrase (0/2) 1  Language : Fluency (0/1) 1  Abstraction (0/2) 2  Delayed Recall (0/5) 2  Orientation (0/6) 5  Total 23            04/04/2023    1:45 PM  MMSE - Mini Mental State  Exam  Orientation to time 4  Orientation to Place 5  Registration 3  Attention/ Calculation 5  Attention/Calculation-comments couldnt do the numbers  Recall 1  Language- name 2 objects 2  Language- repeat 1  Language- follow 3 step command 3  Language- read & follow direction 1  Write a sentence 1  Copy design 1  Total score 27    Attention span & concentration ability appears impaired.  Speech is fluent,  without dysarthria, dysphonia or aphasia.  Mood and affect are  appropriate.   CCranial nerves: Pupils are equal and briskly reactive to light. Funduscopic exam without evidence of pallor or edema Extraocular movements  in vertical and horizontal planes intact and without nystagmus. Visual fields by finger perimetry are intact. Hearing to finger rub impaired, Rinne - Weber maneuver indicated right sided stronger bone conduction. Facial sensation intact to fine touch.  Facial motor strength is symmetric and tongue and uvula move midline.titubation.    Motor exam:   Normal  muscle bulk and symmetric normal strength in all extremities. Grip Strength symmetric, strong. There was some cogwheeling over the left biceps.  Right biceps was relaxed.  Proximal strength of shoulder muscles and hip flexors was strong, no rigor here .   Sensory:  Fine touch and vibration were tested and normal. Proprioception was tested in the upper extremities only and was normal.   Coordination: Rapid alternating movements in the fingers/hands were slightly slower than expected for his career and the required dexterity.   Finger-to-nose maneuver was without evidence of ataxia, dysmetria - but action/ intention tremor.  Holding both arms outstretched made the tremor more visible- on both sides. .    Gait and station: Patient walked without assistive device.  Core Strength within normal limits.  Stance is stable and of normal base.  Tandem gait is intact , turns with 3-4 Steps are unfragmented.  Toe walk intact,  Romberg testing is =normal.   Deep tendon reflexes: in the  upper and lower extremities are symmetric and  brisk , but without Clonus. Babinski maneuver response is up-going on the left     ASSESSMENT AND PLAN:  72 y.o. year old male  here with: postconcussion syndrome.  He was adopted and has no family medical history but it may be a good idea to screen him for AD Biomarkers.     1) post concussion syndrome- with a prolonged recovery , some good and bad days. MCI  by [facility] test at 24/ 30 points. 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   There is some hearing loss, but conversation was unimpaired.  Cataracts are present , not yet mature for surgery.  Brain MRI was normal.   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  He is still waiting for testing with dr Kieth Brightly.   I added OTC recommendation for intake of B complex, Vit D, magnesium.  I plan to follow up either personally or through our NP within 6 months.   I would like to thank Baxley, Luanna Cole, MD  for allowing me to meet with and to take care of this pleasant patient.   CC: I will share my notes with Dr Marikay Alar .  After spending a total time of  28  minutes face to face  and additional time for physical and neurologic examination, review of laboratory studies,  personal review of imaging studies, reports and results of other testing and review of referral information / records as far as provided in visit,   Electronically signed by: Melvyn Novas, MD 08/15/2023 2:51 PM  [facility] Board certified by The [employer] of Sleep Medicine and Diplomate of the [employer] of Sleep Medicine. Board certified In Neurology through the ABPN, Fellow of the [employer] of Neu[employer].

## 2023-08-22 ENCOUNTER — Telehealth: Payer: Self-pay | Admitting: Internal Medicine

## 2023-08-22 ENCOUNTER — Other Ambulatory Visit: Payer: Self-pay | Admitting: Orthopaedic Surgery

## 2023-08-22 DIAGNOSIS — M25511 Pain in right shoulder: Secondary | ICD-10-CM | POA: Diagnosis not present

## 2023-08-22 DIAGNOSIS — Z01818 Encounter for other preprocedural examination: Secondary | ICD-10-CM

## 2023-08-22 NOTE — Telephone Encounter (Signed)
scheduled

## 2023-08-22 NOTE — Telephone Encounter (Signed)
Received Surgery Clearance Form from [facility], Dr Ramond Marrow. Send back  Paul [name] Phone [phone] Fax [phone]  Surgery Right Shoulder hemi arthroplasty General with interscalene block   Date of surgery 09/21/2023  Last labs 04/03/2023 CPE 03/27/2023
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
## 2023-08-31 NOTE — H&P (Signed)
PREOPERATIVE H&P  Chief Complaint: right shoulder OA  HPI: Paul [name] is a 72 y.o. male who is scheduled for, Procedure(s): SHOULDER HEMIARTHROPLASTY.   Patient has a past medical history significant for Hep C.   The patient is a 72 year old active climber who has had right shoulder pain for some time.  We have been worried about arthritis versus bicipital concerns.  He continues to have pain.  He is not happy with the function of his shoulder.  Symptoms are rated as moderate to severe, and have been worsening.  This is significantly impairing activities of daily living.    Please see clinic note for further details on this patient's care.    He has elected for surgical management.   Past Medical History:  Diagnosis Date   Anxiety    Depression    Hepatitis    Hep C treated 12 years ago viral load negative after a year of treatment    Primary localized osteoarthritis of right knee 09/19/2019   S/P TKR (total knee replacement), left   11/12/2018 09/19/2019   Past Surgical History:  Procedure Laterality Date   REPLACEMENT TOTAL KNEE Left 11/12/2018   shouler arthroscopy     bilateral    TOTAL KNEE ARTHROPLASTY Right 09/30/2019   Procedure: TOTAL KNEE ARTHROPLASTY;  Surgeon: Salvatore Marvel, MD;  Location: [facility];  Service: Orthopedics;  Laterality: Right;   Social History   Socioeconomic History   Marital status: Divorced    Spouse name: Not on file   Number of children: Not on file   Years of education: Not on file   Highest education level: Not on file  Occupational History   Occupation: nurse  Tobacco Use   Smoking status: Never   Smokeless tobacco: Never  Vaping Use   Vaping status: Never Used  Substance and Sexual Activity   Alcohol use: Yes    Comment: daily 2-3 beers    Drug use: Yes    Types: Marijuana    Comment: every day or two   Sexual activity: Yes  Other Topics Concern   Not on file  Social History Narrative   Not on file   Social  Determinants of Health   Financial Resource Strain: Not on file  Food Insecurity: Not on file  Transportation Needs: Not on file  Physical Activity: Not on file  Stress: Not on file  Social Connections: Not on file   Family History  Adopted: Yes  Family history unknown: Yes   Allergies  Allergen Reactions   Ciprofloxacin Other (See Comments)    Pt states with his Cymbalta it makes his depression worst   Flagyl [Metronidazole] Other (See Comments)    Pt states when he takes with his Cymbalta it makes his depression worse.    Prior to Admission medications   Medication Sig Start Date End Date Taking? Authorizing Provider  ALPRAZolam Prudy Feeler) 0.5 MG tablet Take 1 tablet (0.5 mg total) by mouth 2 (two) times daily as needed for anxiety. Patient taking differently: Take 0.5 mg by mouth as needed for anxiety. 06/28/22   Margaree Mackintosh, MD  DULoxetine (CYMBALTA) 60 MG capsule TAKE 1 CAPSULE BY MOUTH EVERY DAY Patient taking differently: Take 90 mg by mouth daily. 12/18/22   Margaree Mackintosh, MD  pantoprazole (PROTONIX) 40 MG tablet TAKE ONE TABLET BY MOUTH DAILY FOR GERD 03/27/23   Margaree Mackintosh, MD  sildenafil (VIAGRA) 100 MG tablet Take 50-100 mg by mouth daily as needed  for erectile dysfunction.    [provider]    ROS: All other systems have been reviewed and were otherwise negative with the exception of those mentioned in the HPI and as above.  Physical Exam: General: Alert, no acute distress Cardiovascular: No pedal edema Respiratory: No cyanosis, no use of accessory musculature GI: No organomegaly, abdomen is soft and non-tender Skin: No lesions in the area of chief complaint Neurologic: Sensation intact distally Psychiatric: Patient is competent for consent with normal mood and affect Lymphatic: No axillary or cervical lymphadenopathy  MUSCULOSKELETAL:  On physical examination, range of motion of the shoulder is to 160 degrees.  External rotation 60 degrees.  Internal  rotation to the beltline.  Cuff strength is intact.  Imaging: MRI which demonstrates bony edema in the glenoid and posterior subluxation of the humeral head relative to the glenoid.  He has a chronic posterior labral tear.  He has significant fluid around the biceps and cuff tendinitis but the cuff appears to be intact.  Assessment: right shoulder OA  Plan: Plan for Procedure(s): SHOULDER HEMIARTHROPLASTY  The risks benefits and alternatives were discussed with the patient including but not limited to the risks of nonoperative treatment, versus surgical intervention including infection, bleeding, nerve injury,  blood clots, cardiopulmonary complications, morbidity, mortality, among others, and they were willing to proceed.   The patient acknowledged the explanation, agreed to proceed with the plan and consent was signed.   Operative Plan: Right shoulder hemiarthroplasty Discharge Medications: standard DVT Prophylaxis: aspirin Physical Therapy: outpatient PT Special Discharge needs: Sling (should bring with him). IceMan   Vernetta Honey, PA-C  08/31/2023 3:20 PM

## 2023-09-04 ENCOUNTER — Ambulatory Visit
Admission: RE | Admit: 2023-09-04 | Discharge: 2023-09-04 | Disposition: A | Discharge status: Home / Self Care | Payer: Medicare Other | Source: Ambulatory Visit | Attending: Orthopaedic Surgery | Admitting: Orthopaedic Surgery

## 2023-09-04 ENCOUNTER — Other Ambulatory Visit: Payer: Medicare Other

## 2023-09-04 DIAGNOSIS — M19011 Primary osteoarthritis, right shoulder: Secondary | ICD-10-CM | POA: Diagnosis not present

## 2023-09-04 DIAGNOSIS — Z01818 Encounter for other preprocedural examination: Secondary | ICD-10-CM

## 2023-09-04 DIAGNOSIS — M25511 Pain in right shoulder: Secondary | ICD-10-CM | POA: Diagnosis not present

## 2023-09-04 DIAGNOSIS — G8929 Other chronic pain: Secondary | ICD-10-CM | POA: Diagnosis not present

## 2023-09-05 ENCOUNTER — Ambulatory Visit: Payer: Medicare Other | Admitting: Internal Medicine

## 2023-09-05 ENCOUNTER — Encounter: Payer: Self-pay | Admitting: Internal Medicine

## 2023-09-05 VITALS — BP 130/80 | HR 66 | Ht 71.0 in | Wt 180.0 lb

## 2023-09-05 DIAGNOSIS — Z23 Encounter for immunization: Secondary | ICD-10-CM | POA: Diagnosis not present

## 2023-09-05 DIAGNOSIS — Z01818 Encounter for other preprocedural examination: Secondary | ICD-10-CM

## 2023-09-05 LAB — CBC WITH DIFFERENTIAL/PLATELET
Absolute Monocytes: 520 {cells}/uL (ref 200–950)
Basophils Absolute: 39 {cells}/uL (ref 0–200)
Basophils Relative: 0.6 %
Eosinophils Absolute: 163 {cells}/uL (ref 15–500)
Eosinophils Relative: 2.5 %
HCT: 45.8 % (ref 38.5–50.0)
Hemoglobin: 15.5 g/dL (ref 13.2–17.1)
Lymphs Abs: 969 {cells}/uL (ref 850–3900)
MCH: 31.3 pg (ref 27.0–33.0)
MCHC: 33.8 g/dL (ref 32.0–36.0)
MCV: 92.3 fL (ref 80.0–100.0)
MPV: 8.9 fL (ref 7.5–12.5)
Monocytes Relative: 8 %
Neutro Abs: 4810 {cells}/uL (ref 1500–7800)
Neutrophils Relative %: 74 %
Platelets: 282 10*3/uL (ref 140–400)
RBC: 4.96 10*6/uL (ref 4.20–5.80)
RDW: 14.1 % (ref 11.0–15.0)
Total Lymphocyte: 14.9 %
WBC: 6.5 10*3/uL (ref 3.8–10.8)

## 2023-09-05 LAB — BASIC METABOLIC PANEL
BUN: 19 mg/dL (ref 7–25)
CO2: 30 mmol/L (ref 20–32)
Calcium: 9.8 mg/dL (ref 8.6–10.3)
Chloride: 102 mmol/L (ref 98–110)
Creat: 0.93 mg/dL (ref 0.70–1.28)
Glucose, Bld: 93 mg/dL (ref 65–99)
Potassium: 5.1 mmol/L (ref 3.5–5.3)
Sodium: 142 mmol/L (ref 135–146)

## 2023-09-05 LAB — HEMOGLOBIN A1C
Hgb A1c MFr Bld: 5.6 %{Hb} (ref <5.7)
Mean Plasma Glucose: 114 mg/dL
eAG (mmol/L): 6.3 mmol/L

## 2023-09-06 DIAGNOSIS — K080 Exfoliation of teeth due to systemic causes: Secondary | ICD-10-CM | POA: Diagnosis not present

## 2023-09-08 NOTE — Patient Instructions (Signed)
Cleared for right shoulder hemiarthroplasty. Note to be sent to Dr. Everardo Pacific.

## 2023-09-14 ENCOUNTER — Encounter (HOSPITAL_BASED_OUTPATIENT_CLINIC_OR_DEPARTMENT_OTHER): Payer: Self-pay | Admitting: Orthopaedic Surgery

## 2023-09-14 ENCOUNTER — Other Ambulatory Visit: Payer: Self-pay

## 2023-09-19 NOTE — Anesthesia Preprocedure Evaluation (Signed)
Anesthesia Evaluation  Patient identified by MRN, date of birth, ID band Patient awake    Reviewed: Allergy & Precautions, NPO status , Patient's Chart, lab work & pertinent test results  History of Anesthesia Complications Negative for: history of anesthetic complications  Airway Mallampati: III  TM Distance: >3 FB Neck ROM: Full    Dental  (+) Dental Advisory Given   Pulmonary neg pulmonary ROS   Pulmonary exam normal breath sounds clear to auscultation       Cardiovascular negative cardio ROS  Rhythm:Regular Rate:Normal     Neuro/Psych  PSYCHIATRIC DISORDERS Anxiety Depression    negative neurological ROS     GI/Hepatic ,GERD  Medicated,,(+)     substance abuse  alcohol use and marijuana use, Hepatitis - (s/p treatment), C  Endo/Other  negative endocrine ROS    Renal/GU negative Renal ROS     Musculoskeletal  (+) Arthritis , Osteoarthritis,    Abdominal   Peds  Hematology negative hematology ROS (+) Lab Results      Component                Value               Date                      WBC                      6.5                 09/04/2023                HGB                      15.5                09/04/2023                HCT                      45.8                09/04/2023                MCV                      92.3                09/04/2023                PLT                      282                 09/04/2023              Anesthesia Other Findings   Reproductive/Obstetrics                             Anesthesia Physical Anesthesia Plan  ASA: 2  Anesthesia Plan: General and Regional   Post-op Pain Management: Regional block* and Tylenol PO (pre-op)*   Induction: Intravenous  PONV Risk Score and Plan: 2 and Ondansetron, Dexamethasone and Treatment may vary due to age or medical condition  Airway Management Planned: Oral ETT  Additional Equipment:   Intra-op  Plan:   Post-operative Plan:  Extubation in OR  Informed Consent: I have reviewed the patients History and Physical, chart, labs and discussed the procedure including the risks, benefits and alternatives for the proposed anesthesia with the patient or authorized representative who has indicated his/her understanding and acceptance.     Dental advisory given  Plan Discussed with: CRNA and Anesthesiologist  Anesthesia Plan Comments: (Discussed potential risks of nerve blocks including, but not limited to, infection, bleeding, nerve damage, seizures, pneumothorax, respiratory depression, and potential failure of the block. Alternatives to nerve blocks discussed. All questions answered.  Risks of general anesthesia discussed including, but not limited to, sore throat, hoarse voice, chipped/damaged teeth, injury to vocal cords, nausea and vomiting, allergic reactions, lung infection, heart attack, stroke, and death. All questions answered. )        Anesthesia Quick Evaluation

## 2023-09-20 NOTE — Discharge Instructions (Signed)
Ramond Marrow MD, MPH Alfonse Alpers, PA-C [facility] [address], [address] [phone] (tel)   [phone] (fax)   POST-OPERATIVE INSTRUCTIONS - TOTAL SHOULDER REPLACEMENT    WOUND CARE You may leave the operative dressing in place until your follow-up appointment. KEEP THE INCISIONS CLEAN AND DRY. There may be a small amount of fluid/bleeding leaking at the surgical site. This is normal after surgery.  If it fills with liquid or blood please call us immediately to change it for you. Use the provided ice machine or Ice packs as often as possible for the first 3-4 days, then as needed for pain relief.   Keep a layer of cloth or a shirt between your skin and the cooling unit to prevent frost bite as it can get very cold.  SHOWERING: - You may shower on Post-Op Day #2.  - The dressing is water resistant but do not scrub it as it may start to peel up.   - You may remove the sling for showering - Gently pat the area dry.  - Do not soak the shoulder in water.  - Do not go swimming in the pool or ocean until your incision has completely healed (about 4-6 weeks after surgery) - KEEP THE INCISIONS CLEAN AND DRY.  EXERCISES Wear the sling at all times  You may remove the sling for showering, but keep the arm across the chest or in a secondary sling.    Accidental/Purposeful External Rotation and shoulder flexion (reaching behind you) is to be avoided at all costs for the first month. It is ok to come out of your sling if your are sitting and have assistance for eating.   Do not lift anything heavier than 1 pound until we discuss it further in clinic.  It is normal for your fingers/hand to become more swollen after surgery and discolored from bruising.   This will resolve over the first few weeks usually after surgery. Please continue to ambulate and do not stay sitting or lying for too long.  Perform foot and wrist pumps to assist in circulation.  PHYSICAL  THERAPY - You will begin physical therapy soon after surgery (unless otherwise specified) - Please call to set up an appointment, if you do not already have one  - Let our office if there are any issues with scheduling your therapy  - You have a physical therapy appointment scheduled at [facility] (across the hall from our office) on 11/4    REGIONAL ANESTHESIA (NERVE BLOCKS) The anesthesia team may have performed a nerve block for you this is a great tool used to minimize pain.   The block may start wearing off overnight (between 8-24 hours postop) When the block wears off, your pain may go from nearly zero to the pain you would have had postop without the block. This is an abrupt transition but nothing dangerous is happening.   This can be a challenging period but utilize your as needed pain medications to try and manage this period. We suggest you use the pain medication the first night prior to going to bed, to ease this transition.  You may take an extra dose of narcotic when this happens if needed   POST-OP MEDICATIONS- Multimodal approach to pain control In general your pain will be controlled with a combination of substances.  Prescriptions unless otherwise discussed are electronically sent to your pharmacy.  This is a carefully made plan we use to minimize narcotic use.  Celebrex - Anti-inflammatory medication taken on a scheduled basis Acetaminophen - Non-narcotic pain medicine taken on a scheduled basis  Oxycodone - This is a strong narcotic, to be used only on an "as needed" basis for SEVERE pain. Do not take this at the same time as your xanax.  Aspirin 81mg  - This medicine is used to minimize the risk of blood clots after surgery. Omeprazole - daily medicine to protect your stomach while taking anti-inflammatories.   Zofran -  take as needed for nausea   FOLLOW-UP If you develop a Fever (>101.5), Redness or Drainage from the surgical incision site, please call our office to  arrange for an evaluation. Please call the office to schedule a follow-up appointment for a wound check, 7-10 days post-operatively.  IF YOU HAVE ANY QUESTIONS, PLEASE FEEL FREE TO CALL OUR OFFICE.  HELPFUL INFORMATION  Your arm will be in a sling following surgery. You will be in this sling for the next 4 weeks.   You may be more comfortable sleeping in a semi-seated position the first few nights following surgery.  Keep a pillow propped under the elbow and forearm for comfort.  If you have a recliner type of chair it might be beneficial.  If not that is fine too, but it would be helpful to sleep propped up with pillows behind your operated shoulder as well under your elbow and forearm.  This will reduce pulling on the suture lines.  When dressing, put your operative arm in the sleeve first.  When getting undressed, take your operative arm out last.  Loose fitting, button-down shirts are recommended.  In most states it is against the law to drive while your arm is in a sling. And certainly against the law to drive while taking narcotics.  You may return to work/school in the next couple of days when you feel up to it. Desk work and typing in the sling is fine.  We suggest you use the pain medication the first night prior to going to bed, in order to ease any pain when the anesthesia wears off. You should avoid taking pain medications on an empty stomach as it will make you nauseous.  You should wean off your narcotic medicines as soon as you are able.     Most patients will be off narcotics before their first postop appointment.   Do not drink alcoholic beverages or take illicit drugs when taking pain medications.  Pain medication may make you constipated.  Below are a few solutions to try in this order: Decrease the amount of pain medication if you aren't having pain. Drink lots of decaffeinated fluids. Drink prune juice and/or each dried prunes  If the first 3 don't work start with  additional solutions Take Colace - an over-the-counter stool softener Take Senokot - an over-the-counter laxative Take Miralax - a stronger over-the-counter laxative   Dental Antibiotics:  In most cases prophylactic antibiotics for Dental procdeures after total joint surgery are not necessary.  Exceptions are as follows:  1. History of prior total joint infection  2. Severely immunocompromised (Organ Transplant, cancer chemotherapy, Rheumatoid biologic meds such as Humira)  3. Poorly controlled diabetes (A1C &gt; 8.0, blood glucose over 200)  If you have one of these conditions, contact your surgeon for an antibiotic prescription, prior to your dental procedure.   For more information including helpful videos and documents visit our website:   [email or web address]     UltraSling Information:  Detach shoulder strap buckles and open  front panel.        Position elbow in the sling as far back as possible 2.   Place shoulder strap over opposite shoulder       Connect shoulder strap to the sling using the quick release buckles. 3.   Secure strap at the top. It should go over your forearm and attach to the other side.        The thumb strap attaches to the front of the sling. It goes between your thumb and index finger. 5.   The pillow should be at your waistline.        Your sling will Velcro to the pillow.        Buckle the waist strap to the pillow and adjust the strap as needed.   Here is a video you can watch:  [email or web address]   No Tylenol before 12:45pm.  Post Anesthesia Home Care Instructions  Activity: Get plenty of rest for the remainder of the day. A responsible individual must stay with you for 24 hours following the procedure.  For the next 24 hours, DO NOT: -Drive a car -Advertising copywriter -Drink alcoholic beverages -Take any medication unless instructed by your physician -Make any legal  decisions or sign important papers.  Meals: Start with liquid foods such as gelatin or soup. Progress to regular foods as tolerated. Avoid greasy, spicy, heavy foods. If nausea and/or vomiting occur, drink only clear liquids until the nausea and/or vomiting subsides. Call your physician if vomiting continues.  Special Instructions/Symptoms: Your throat may feel dry or sore from the anesthesia or the breathing tube placed in your throat during surgery. If this causes discomfort, gargle with warm salt water. The discomfort should disappear within 24 hours.     Regional Anesthesia Blocks  1. You may not be able to move or feel the "blocked" extremity after a regional anesthetic block. This may last may last from 3-48 hours after placement, but it will go away. The length of time depends on the medication injected and your individual response to the medication. As the nerves start to wake up, you may experience tingling as the movement and feeling returns to your extremity. If the numbness and inability to move your extremity has not gone away after 48 hours, please call your surgeon.   2. The extremity that is blocked will need to be protected until the numbness is gone and the strength has returned. Because you cannot feel it, you will need to take extra care to avoid injury. Because it may be weak, you may have difficulty moving it or using it. You may not know what position it is in without looking at it while the block is in effect.  3. For blocks in the legs and feet, returning to weight bearing and walking needs to be done carefully. You will need to wait until the numbness is entirely gone and the strength has returned. You should be able to move your leg and foot normally before you try and bear weight or walk. You will need someone to be with you when you first try to ensure you do not fall and possibly risk injury.  4. Bruising and tenderness at the needle site are common side effects and  will resolve in a few days.  5. Persistent numbness or new problems with movement should be communicated to the surgeon or the [facility] [phone] [facility] [phone]).

## 2023-09-21 ENCOUNTER — Other Ambulatory Visit: Payer: Self-pay

## 2023-09-21 ENCOUNTER — Ambulatory Visit (HOSPITAL_COMMUNITY): Payer: Medicare Other

## 2023-09-21 ENCOUNTER — Encounter (HOSPITAL_BASED_OUTPATIENT_CLINIC_OR_DEPARTMENT_OTHER)
Admission: RE | Disposition: A | Discharge status: Home / Self Care | Payer: Self-pay | Source: Home / Self Care | Attending: Orthopaedic Surgery

## 2023-09-21 ENCOUNTER — Ambulatory Visit (HOSPITAL_BASED_OUTPATIENT_CLINIC_OR_DEPARTMENT_OTHER): Payer: Medicare Other | Admitting: Anesthesiology

## 2023-09-21 ENCOUNTER — Ambulatory Visit (HOSPITAL_BASED_OUTPATIENT_CLINIC_OR_DEPARTMENT_OTHER): Payer: Self-pay | Admitting: Anesthesiology

## 2023-09-21 ENCOUNTER — Encounter (HOSPITAL_BASED_OUTPATIENT_CLINIC_OR_DEPARTMENT_OTHER): Payer: Self-pay | Admitting: Orthopaedic Surgery

## 2023-09-21 ENCOUNTER — Ambulatory Visit (HOSPITAL_BASED_OUTPATIENT_CLINIC_OR_DEPARTMENT_OTHER)
Admission: RE | Admit: 2023-09-21 | Discharge: 2023-09-21 | Disposition: A | Discharge status: Home / Self Care | Payer: Medicare Other | Attending: Orthopaedic Surgery | Admitting: Orthopaedic Surgery

## 2023-09-21 DIAGNOSIS — K219 Gastro-esophageal reflux disease without esophagitis: Secondary | ICD-10-CM | POA: Insufficient documentation

## 2023-09-21 DIAGNOSIS — Z8619 Personal history of other infectious and parasitic diseases: Secondary | ICD-10-CM | POA: Diagnosis not present

## 2023-09-21 DIAGNOSIS — Z471 Aftercare following joint replacement surgery: Secondary | ICD-10-CM | POA: Diagnosis not present

## 2023-09-21 DIAGNOSIS — M19011 Primary osteoarthritis, right shoulder: Secondary | ICD-10-CM

## 2023-09-21 DIAGNOSIS — Z96611 Presence of right artificial shoulder joint: Secondary | ICD-10-CM | POA: Diagnosis not present

## 2023-09-21 DIAGNOSIS — Z96653 Presence of artificial knee joint, bilateral: Secondary | ICD-10-CM | POA: Diagnosis not present

## 2023-09-21 DIAGNOSIS — G8918 Other acute postprocedural pain: Secondary | ICD-10-CM | POA: Diagnosis not present

## 2023-09-21 HISTORY — PX: SHOULDER HEMI-ARTHROPLASTY: SHX5049

## 2023-09-21 SURGERY — HEMIARTHROPLASTY, SHOULDER
Anesthesia: Regional | Site: Shoulder | Laterality: Right

## 2023-09-21 MED ORDER — LACTATED RINGERS IV SOLN: INTRAVENOUS | Status: DC

## 2023-09-21 MED ORDER — ONDANSETRON HCL 4 MG/2ML IJ SOLN
INTRAMUSCULAR | Status: DC | PRN
  Administered 2023-09-21: 4 mg via INTRAVENOUS

## 2023-09-21 MED ORDER — LIDOCAINE 2% (20 MG/ML) 5 ML SYRINGE
INTRAMUSCULAR | Status: AC
  Filled 2023-09-21: qty 5

## 2023-09-21 MED ORDER — PHENYLEPHRINE HCL (PRESSORS) 10 MG/ML IV SOLN
INTRAVENOUS | Status: AC
  Filled 2023-09-21: qty 1

## 2023-09-21 MED ORDER — PHENYLEPHRINE 80 MCG/ML (10ML) SYRINGE FOR IV PUSH (FOR BLOOD PRESSURE SUPPORT)
PREFILLED_SYRINGE | INTRAVENOUS | Status: AC
  Filled 2023-09-21: qty 10

## 2023-09-21 MED ORDER — ACETAMINOPHEN 500 MG PO TABS
1000.0000 mg | ORAL_TABLET | Freq: Once | ORAL | Status: AC
  Administered 2023-09-21: 1000 mg via ORAL

## 2023-09-21 MED ORDER — TRANEXAMIC ACID-NACL 1000-0.7 MG/100ML-% IV SOLN
1000.0000 mg | INTRAVENOUS | Status: AC
  Administered 2023-09-21: 1000 mg via INTRAVENOUS

## 2023-09-21 MED ORDER — FENTANYL CITRATE (PF) 100 MCG/2ML IJ SOLN
INTRAMUSCULAR | Status: AC
  Filled 2023-09-21: qty 2

## 2023-09-21 MED ORDER — 0.9 % SODIUM CHLORIDE (POUR BTL) OPTIME
TOPICAL | Status: DC | PRN
Start: 2023-09-21 — End: 2023-09-21
  Administered 2023-09-21: 1000 mL

## 2023-09-21 MED ORDER — VANCOMYCIN HCL 1000 MG IV SOLR
INTRAVENOUS | Status: AC
  Filled 2023-09-21: qty 40

## 2023-09-21 MED ORDER — GABAPENTIN 300 MG PO CAPS
ORAL_CAPSULE | ORAL | Status: AC
  Filled 2023-09-21: qty 1

## 2023-09-21 MED ORDER — FENTANYL CITRATE (PF) 100 MCG/2ML IJ SOLN
50.0000 ug | Freq: Once | INTRAMUSCULAR | Status: AC
  Administered 2023-09-21: 50 ug via INTRAVENOUS

## 2023-09-21 MED ORDER — DEXAMETHASONE SODIUM PHOSPHATE 10 MG/ML IJ SOLN
INTRAMUSCULAR | Status: AC
  Filled 2023-09-21: qty 1

## 2023-09-21 MED ORDER — ROCURONIUM BROMIDE 10 MG/ML (PF) SYRINGE
PREFILLED_SYRINGE | INTRAVENOUS | Status: AC
  Filled 2023-09-21: qty 10

## 2023-09-21 MED ORDER — ASPIRIN 81 MG PO CHEW: 81.0000 mg | CHEWABLE_TABLET | Freq: Two times a day (BID) | ORAL | 0 refills | Status: AC

## 2023-09-21 MED ORDER — TRANEXAMIC ACID-NACL 1000-0.7 MG/100ML-% IV SOLN
INTRAVENOUS | Status: AC
  Filled 2023-09-21: qty 100

## 2023-09-21 MED ORDER — PROPOFOL 10 MG/ML IV BOLUS
INTRAVENOUS | Status: DC | PRN
  Administered 2023-09-21: 150 mg via INTRAVENOUS

## 2023-09-21 MED ORDER — PHENYLEPHRINE HCL (PRESSORS) 10 MG/ML IV SOLN
INTRAVENOUS | Status: AC
Start: 2023-09-21 — End: ?
  Filled 2023-09-21: qty 1

## 2023-09-21 MED ORDER — BUPIVACAINE LIPOSOME 1.3 % IJ SUSP
INTRAMUSCULAR | Status: DC | PRN
  Administered 2023-09-21: 10 mL via PERINEURAL

## 2023-09-21 MED ORDER — OXYCODONE HCL 5 MG PO TABS: ORAL_TABLET | ORAL | 0 refills | Status: AC

## 2023-09-21 MED ORDER — EPHEDRINE 5 MG/ML INJ
INTRAVENOUS | Status: AC
  Filled 2023-09-21: qty 5

## 2023-09-21 MED ORDER — AMISULPRIDE (ANTIEMETIC) 5 MG/2ML IV SOLN
INTRAVENOUS | Status: AC
  Filled 2023-09-21: qty 4

## 2023-09-21 MED ORDER — CELECOXIB 100 MG PO CAPS: 100.0000 mg | ORAL_CAPSULE | Freq: Two times a day (BID) | ORAL | 0 refills | Status: AC

## 2023-09-21 MED ORDER — ONDANSETRON HCL 4 MG PO TABS: 4.0000 mg | ORAL_TABLET | Freq: Three times a day (TID) | ORAL | 0 refills | Status: AC | PRN

## 2023-09-21 MED ORDER — ROCURONIUM BROMIDE 100 MG/10ML IV SOLN
INTRAVENOUS | Status: DC | PRN
  Administered 2023-09-21: 60 mg via INTRAVENOUS

## 2023-09-21 MED ORDER — LIDOCAINE 2% (20 MG/ML) 5 ML SYRINGE
INTRAMUSCULAR | Status: DC | PRN
  Administered 2023-09-21: 60 mg via INTRAVENOUS

## 2023-09-21 MED ORDER — DEXAMETHASONE SODIUM PHOSPHATE 10 MG/ML IJ SOLN
INTRAMUSCULAR | Status: DC | PRN
  Administered 2023-09-21: 10 mg via INTRAVENOUS

## 2023-09-21 MED ORDER — FENTANYL CITRATE (PF) 100 MCG/2ML IJ SOLN
INTRAMUSCULAR | Status: DC | PRN
  Administered 2023-09-21: 50 ug via INTRAVENOUS

## 2023-09-21 MED ORDER — CEFAZOLIN SODIUM-DEXTROSE 2-4 GM/100ML-% IV SOLN
2.0000 g | INTRAVENOUS | Status: AC
  Administered 2023-09-21: 2 g via INTRAVENOUS

## 2023-09-21 MED ORDER — BUPIVACAINE HCL (PF) 0.5 % IJ SOLN
INTRAMUSCULAR | Status: DC | PRN
  Administered 2023-09-21: 10 mL via PERINEURAL

## 2023-09-21 MED ORDER — EPHEDRINE SULFATE (PRESSORS) 50 MG/ML IJ SOLN
INTRAMUSCULAR | Status: DC | PRN
  Administered 2023-09-21: 5 mg via INTRAVENOUS
  Administered 2023-09-21 (×2): 10 mg via INTRAVENOUS
  Administered 2023-09-21: 5 mg via INTRAVENOUS

## 2023-09-21 MED ORDER — ACETAMINOPHEN 500 MG PO TABS
ORAL_TABLET | ORAL | Status: AC
  Filled 2023-09-21: qty 2

## 2023-09-21 MED ORDER — SUGAMMADEX SODIUM 200 MG/2ML IV SOLN
INTRAVENOUS | Status: DC | PRN
  Administered 2023-09-21: 200 mg via INTRAVENOUS

## 2023-09-21 MED ORDER — VANCOMYCIN HCL 1000 MG IV SOLR
INTRAVENOUS | Status: DC | PRN
  Administered 2023-09-21: 1000 mg via TOPICAL

## 2023-09-21 MED ORDER — PHENYLEPHRINE HCL-NACL 20-0.9 MG/250ML-% IV SOLN
INTRAVENOUS | Status: DC | PRN
  Administered 2023-09-21: 25 ug/min via INTRAVENOUS

## 2023-09-21 MED ORDER — OXYCODONE HCL 5 MG/5ML PO SOLN: 5.0000 mg | Freq: Once | ORAL | Status: DC | PRN

## 2023-09-21 MED ORDER — MIDAZOLAM HCL 2 MG/2ML IJ SOLN
INTRAMUSCULAR | Status: AC
  Filled 2023-09-21: qty 2

## 2023-09-21 MED ORDER — SODIUM CHLORIDE 0.9 % IV SOLN: INTRAVENOUS | Status: DC | PRN

## 2023-09-21 MED ORDER — AMISULPRIDE (ANTIEMETIC) 5 MG/2ML IV SOLN
10.0000 mg | Freq: Once | INTRAVENOUS | Status: AC | PRN
  Administered 2023-09-21: 10 mg via INTRAVENOUS

## 2023-09-21 MED ORDER — OXYCODONE HCL 5 MG PO TABS: 5.0000 mg | ORAL_TABLET | Freq: Once | ORAL | Status: DC | PRN

## 2023-09-21 MED ORDER — PHENYLEPHRINE HCL (PRESSORS) 10 MG/ML IV SOLN
INTRAVENOUS | Status: DC | PRN
  Administered 2023-09-21 (×2): 80 ug via INTRAVENOUS

## 2023-09-21 MED ORDER — PROPOFOL 10 MG/ML IV BOLUS
INTRAVENOUS | Status: AC
  Filled 2023-09-21: qty 20

## 2023-09-21 MED ORDER — FENTANYL CITRATE (PF) 100 MCG/2ML IJ SOLN
25.0000 ug | INTRAMUSCULAR | Status: DC | PRN
  Administered 2023-09-21: 25 ug via INTRAVENOUS

## 2023-09-21 MED ORDER — GABAPENTIN 300 MG PO CAPS
300.0000 mg | ORAL_CAPSULE | Freq: Once | ORAL | Status: AC
  Administered 2023-09-21: 300 mg via ORAL

## 2023-09-21 MED ORDER — ACETAMINOPHEN 500 MG PO TABS
1000.0000 mg | ORAL_TABLET | Freq: Three times a day (TID) | ORAL | 0 refills | Status: AC
Start: 2023-09-21 — End: 2023-10-05

## 2023-09-21 MED ORDER — CEFAZOLIN SODIUM-DEXTROSE 2-4 GM/100ML-% IV SOLN
INTRAVENOUS | Status: AC
  Filled 2023-09-21: qty 100

## 2023-09-21 MED ORDER — ONDANSETRON HCL 4 MG/2ML IJ SOLN
INTRAMUSCULAR | Status: AC
  Filled 2023-09-21: qty 2

## 2023-09-21 SURGICAL SUPPLY — 66 items
AID PSTN UNV HD RSTRNT DISP (MISCELLANEOUS) ×1
APL PRP STRL LF DISP 70% ISPRP (MISCELLANEOUS) ×1
BLADE SAW SGTL 73X25 THK (BLADE) ×1 IMPLANT
BLADE SURG 10 STRL SS (BLADE) IMPLANT
BLADE SURG 15 STRL LF DISP TIS (BLADE) IMPLANT
BLADE SURG 15 STRL SS (BLADE)
CEMENT BONE DEPUY (Cement) ×1 IMPLANT
CHLORAPREP W/TINT 26 (MISCELLANEOUS) ×1 IMPLANT
CLSR STERI-STRIP ANTIMIC 1/2X4 (GAUZE/BANDAGES/DRESSINGS) ×1 IMPLANT
COOLER ICEMAN CLASSIC (MISCELLANEOUS) ×1 IMPLANT
COVER BACK TABLE 60X90IN (DRAPES) ×1 IMPLANT
COVER MAYO STAND STRL (DRAPES) ×1 IMPLANT
DRAPE IMP U-DRAPE 54X76 (DRAPES) IMPLANT
DRAPE INCISE IOBAN 66X45 STRL (DRAPES) ×1 IMPLANT
DRAPE POUCH INSTRU U-SHP 10X18 (DRAPES) ×1 IMPLANT
DRAPE U-SHAPE 76X120 STRL (DRAPES) ×2 IMPLANT
DRSG AQUACEL AG ADV 3.5X 6 (GAUZE/BANDAGES/DRESSINGS) ×1 IMPLANT
ELECT BLADE 4.0 EZ CLEAN MEGAD (MISCELLANEOUS) ×1
ELECT REM PT RETURN 9FT ADLT (ELECTROSURGICAL) ×1
ELECTRODE BLDE 4.0 EZ CLN MEGD (MISCELLANEOUS) ×1 IMPLANT
ELECTRODE REM PT RTRN 9FT ADLT (ELECTROSURGICAL) ×1 IMPLANT
FACESHIELD WRAPAROUND (MASK) ×2
FACESHIELD WRAPAROUND OR TEAM (MASK) ×2 IMPLANT
GAUZE XEROFORM 1X8 LF (GAUZE/BANDAGES/DRESSINGS) IMPLANT
GLOVE BIO SURGEON STRL SZ 6.5 (GLOVE) ×2 IMPLANT
GLOVE BIOGEL PI IND STRL 6.5 (GLOVE) ×1 IMPLANT
GLOVE BIOGEL PI IND STRL 8 (GLOVE) ×1 IMPLANT
GLOVE ECLIPSE 8.0 STRL XLNG CF (GLOVE) ×3 IMPLANT
GOWN STRL REUS W/ TWL LRG LVL3 (GOWN DISPOSABLE) ×2 IMPLANT
GOWN STRL REUS W/TWL LRG LVL3 (GOWN DISPOSABLE) ×2
GOWN STRL REUS W/TWL XL LVL3 (GOWN DISPOSABLE) ×1 IMPLANT
GUIDE PIN 3X75 SHOULDER (PIN)
GUIDEWIRE GLENOID 2.5X220 (WIRE) ×1 IMPLANT
HANDPIECE INTERPULSE COAX TIP (DISPOSABLE) ×1
HEAD HUM HIGH OFFSET 4 (Orthopedic Implant) IMPLANT
HUMERAL STEM AEQUALIS 3X74 (Shoulder) ×1 IMPLANT
KIT SHOULDER STAB MARCO (KITS) ×1 IMPLANT
MANIFOLD NEPTUNE II (INSTRUMENTS) ×1 IMPLANT
NDL MAYO TROCAR (NEEDLE) ×1 IMPLANT
NEEDLE MAYO TROCAR (NEEDLE) ×1
NS IRRIG 1000ML POUR BTL (IV SOLUTION) ×1 IMPLANT
PACK BASIN DAY SURGERY FS (CUSTOM PROCEDURE TRAY) ×1 IMPLANT
PACK SHOULDER (CUSTOM PROCEDURE TRAY) ×1 IMPLANT
PAD COLD SHLDR WRAP-ON (PAD) ×1 IMPLANT
PENCIL SMOKE EVACUATOR (MISCELLANEOUS) IMPLANT
PIN GUIDE 3X75 SHOULDER (PIN) ×1 IMPLANT
RESTRAINT HEAD UNIVERSAL NS (MISCELLANEOUS) ×1 IMPLANT
SET HNDPC FAN SPRY TIP SCT (DISPOSABLE) ×1 IMPLANT
SHEET MEDIUM DRAPE 40X70 STRL (DRAPES) ×1 IMPLANT
SLEEVE SCD COMPRESS KNEE MED (STOCKING) ×1 IMPLANT
SMARTMIX MINI TOWER (MISCELLANEOUS) ×1
SPONGE T-LAP 18X18 ~~LOC~~+RFID (SPONGE) IMPLANT
STEM HUMERAL AEQUALIS 3X74 (Shoulder) IMPLANT
SUT ETHIBOND 2 V 37 (SUTURE) ×1 IMPLANT
SUT ETHIBOND NAB CT1 #1 30IN (SUTURE) ×1 IMPLANT
SUT ETHILON 3 0 PS 1 (SUTURE) IMPLANT
SUT FIBERWIRE #2 38 REV NDL BL (SUTURE)
SUT FIBERWIRE #5 38 CONV NDL (SUTURE) ×6
SUT MNCRL AB 4-0 PS2 18 (SUTURE) ×1 IMPLANT
SUT VIC AB 3-0 SH 27 (SUTURE) ×1
SUT VIC AB 3-0 SH 27X BRD (SUTURE) ×1 IMPLANT
SUTURE FIBERWR #5 38 CONV NDL (SUTURE) ×6 IMPLANT
SUTURE FIBERWR#2 38 REV NDL BL (SUTURE) IMPLANT
TOWEL GREEN STERILE FF (TOWEL DISPOSABLE) ×3 IMPLANT
TOWER SMARTMIX MINI (MISCELLANEOUS) ×1 IMPLANT
TUBE SUCTION HIGH CAP CLEAR NV (SUCTIONS) ×1 IMPLANT

## 2023-09-21 NOTE — Op Note (Signed)
Orthopaedic Surgery Operative Note (CSN: [id number])  Paul [name]  [birth year] Date of Surgery: 09/21/2023   Diagnoses:  Primary glenohumeral arthritis shoulder  Procedure: Right shoulder hemiarthroplasty with pyrocarbon implant    Operative Finding Successful completion of planned procedure.  Patient's cuff was intact, great subscapularis repair.  Patient will follow a total shoulder arthroplasty protocol  Post-operative plan: The patient will be NWB in sling.  The patient will be will be discharged from PACU if continues to be stable as was plan prior to surgery.  DVT prophylaxis Aspirin 81 mg twice daily for 6 weeks.  Pain control with PRN pain medication preferring oral medicines.  Follow up plan will be scheduled in approximately 7 days for incision check and XR.  Physical therapy to start immediately.  Implants: Tornier flex size 3C stem, 46 pyrocarbon head  Post-Op Diagnosis: Same Surgeons:Primary: Bjorn Pippin, MD Assistants:Deb Lizbeth Bark Location: [facility] OR ROOM 6 Anesthesia: General with Exparel Interscalene Antibiotics: Ancef 2g preop, Vancomycin 1000mg  locally Tourniquet time: None Estimated Blood Loss: 100 Complications: None Specimens: None Implants: Implant Name Type Inv. Item Serial No. Manufacturer Lot No. LRB No. Used Action  aequalis ascend flex Orthopedic Implant  [id number] [employer]  Right 1 Implanted  tornier flex shoulder humeral head Orthopedic Implant  [id number] [employer]  Right 1 Implanted    Indications for Surgery:   Paul [name] is a 72 y.o. male with primary glenohumeral arthritis who is an avid rock climber.  While total shoulder arthroplasty may be indicated in a standard patient the patient's activities but his shoulder risk for glenoid loosening.  We felt that a hemiarthroplasty would be appropriate.  Benefits and risks of operative and nonoperative management were discussed prior to surgery with patient/guardian(s) and informed consent  form was completed.  Infection and need for further surgery were discussed as was prosthetic stability and cuff issues.  We additionally specifically discussed risks of axillary nerve injury, infection, periprosthetic fracture, continued pain and longevity of implants prior to beginning procedure.      Procedure:   The patient was identified in the preoperative holding area where the surgical site was marked. Block placed by anesthesia with exparel.  The patient was taken to the OR where a procedural timeout was called and the above noted anesthesia was induced.  The patient was positioned beachchair on allen table with spider arm positioner.  Preoperative antibiotics were dosed.  The patient's right shoulder was prepped and draped in the usual sterile fashion.  A second preoperative timeout was called.       Standard deltopectoral approach was performed with a #10 blade. We dissected down to the subcutaneous tissues and the cephalic vein was taken laterally with the deltoid. Clavipectoral fascia was incised in line with the incision. Deep retractors were placed. The long of the biceps tendon was identified and there was significant tenosynovitis present.  Tenodesis was performed to the pectoralis tendon with #2 Ethibond. The remaining biceps was followed up into the rotator interval where it was released. The subscapularis was taken down with a lesser tuberosity osteotomy using an osteotome. The osteotomy fragment and underlying capsular elevated off of the humeral neck and the osteophytes inferiorly. #2 Ethibond sutures are passed through the bone tendon junction for subscap manipulation. We continued releasing the capsule directly off of the osteophytes inferiorly all the way around the corner. This allowed [place] to dislocate the humeral head. The humeral head had evidence of severe osteoarthritic wear with full-thickness cartilage loss  and exposed subchondral bone. There was significant flattening of the  humeral head.   The rotator cuff was carefully examined and noted to be intact without sign of wear.  The decision was confirmed that an hemiarthroplasty shoulder was indicated for this patient.  There were osteophytes along the inferior humeral neck. The osteophytes were removed with an osteotome and a rongeur.  Osteophytes were removed with a rongeur and an osteotome and the anatomic neck was well visualized.   We next made our humeral osteotomy with an oscillating saw along the anatomic neck. The head fragment was passed off the back table and measured approximately 45 mm in diameter.   A starter awl was used to open the humeral canal. We reamed with T-handle sound  reamers up an appropriate fit. The chisel was used to remove proximal humeral bone. We then broached starting with a size 1 broach increasing to size 2 which had secure and stable fit. The inclination was set at C. The broach handle was removed and a cut protector was placed.   We trialed with multiple size head options and selected a 43 which re-created the patient's anatomy. The offset was dialed in to match the normal anatomy. The shoulder was trialed.  There was good ROM in all planes and the shoulder was stable with approximately 50% posterior spring back and no inferior translation.  The real humeral implants were opened on the back table and assembled.  The trial was removed. #5 Fiberwire sutures passed through the humeral neck for subscap repair. The humeral component was press-fit obtaining a secure fit.  The joint was reduced and thoroughly irrigated with pulsatile lavage. Subscap and lesser tuberosity osteotomy repaired back in a double row fashion with #5 Fiberwire sutures through bone tunnels. Next the rotator interval was closed with #2 ethibond suture. Hemostasis was obtained. The deltopectoral interval was reapproximated with #1 Ethibond. The subcutaneous tissues were closed with 2-0 Vicryl and the skin was closed with a  running monocryl. The incisions were cleaned and dried and an Aquacel dressing was placed. The drapes taken down. The arm was placed into sling with abduction pillow. Patient was awakened, extubated, and transferred to the recovery room in stable condition. There were no intraoperative complications. The sponge, needle, and attention counts were correct at the end of the case.     Alfonse Alpers, PA-C, present and scrubbed throughout the case, critical for completion in a timely fashion, and for retraction, instrumentation, closure.

## 2023-09-21 NOTE — Interval H&P Note (Signed)
All questions answered, patient wants to proceed with procedure. ? ?

## 2023-09-21 NOTE — Anesthesia Postprocedure Evaluation (Signed)
Anesthesia Post Note  Patient: Paul [name]  Procedure(s) Performed: SHOULDER HEMIARTHROPLASTY (Right: Shoulder)     Patient location during evaluation: PACU Anesthesia Type: Regional and General Level of consciousness: awake Pain management: pain level controlled Vital Signs Assessment: post-procedure vital signs reviewed and stable Respiratory status: spontaneous breathing, nonlabored ventilation and respiratory function stable Cardiovascular status: blood pressure returned to baseline and stable Postop Assessment: no apparent nausea or vomiting Anesthetic complications: no   No notable events documented.  Last Vitals:  Vitals:   09/21/23 0930 09/21/23 0944  BP: (!) 118/59 117/65  Pulse: 68 68  Resp: (!) 21 (!) 22  Temp:    SpO2: 94% 95%    Last Pain:  Vitals:   09/21/23 0944  TempSrc:   PainSc: 3                  [name]

## 2023-09-21 NOTE — Anesthesia Procedure Notes (Signed)
Procedure Name: Intubation Date/Time: 09/21/2023 7:30 AM  Performed by: Lauralyn Primes, CRNAPre-anesthesia Checklist: Patient identified, Emergency Drugs available, Suction available and Patient being monitored Patient Re-evaluated:Patient Re-evaluated prior to induction Oxygen Delivery Method: Circle system utilized Preoxygenation: Pre-oxygenation with 100% oxygen Induction Type: IV induction Ventilation: Mask ventilation without difficulty Laryngoscope Size: Mac and 4 Grade View: Grade II Tube type: Oral Tube size: 7.5 mm Number of attempts: 1 Airway Equipment and Method: Stylet and Bite block Placement Confirmation: ETT inserted through vocal cords under direct vision, positive ETCO2 and breath sounds checked- equal and bilateral Secured at: 25 cm Tube secured with: Tape Dental Injury: Teeth and Oropharynx as per pre-operative assessment

## 2023-09-21 NOTE — Transfer of Care (Signed)
Immediate Anesthesia Transfer of Care Note  Patient: Paul [name]  Procedure(s) Performed: SHOULDER HEMIARTHROPLASTY (Right: Shoulder)  Patient Location: PACU  Anesthesia Type:GA combined with regional for post-op pain  Level of Consciousness: drowsy  Airway & Oxygen Therapy: Patient Spontanous Breathing and Patient connected to face mask oxygen  Post-op Assessment: Report given to RN and Post -op Vital signs reviewed and stable  Post vital signs: Reviewed and stable  Last Vitals:  Vitals Value Taken Time  BP 125/66 09/21/23 0854  Temp    Pulse 70 09/21/23 0856  Resp 27 09/21/23 0856  SpO2 100 % 09/21/23 0856  Vitals shown include unfiled device data.  Last Pain:  Vitals:   09/21/23 0637  TempSrc: Temporal  PainSc: 2          Complications: No notable events documented.

## 2023-09-21 NOTE — Anesthesia Procedure Notes (Signed)
Anesthesia Regional Block: Interscalene brachial plexus block   Pre-Anesthetic Checklist: , timeout performed,  Correct Patient, Correct Site, Correct Laterality,  Correct Procedure, Correct Position, site marked,  Risks and benefits discussed,  Surgical consent,  Pre-op evaluation,  At surgeon's request and post-op pain management  Laterality: Right  Prep: chloraprep       Needles:  Injection technique: Single-shot  Needle Type: Echogenic Stimulator Needle     Needle Length: 9cm  Needle Gauge: 21     Additional Needles:   Procedures:,,,, ultrasound used (permanent image in chart),,    Narrative:  Start time: 09/21/2023 7:00 AM End time: 09/21/2023 7:03 AM Injection made incrementally with aspirations every 5 mL.  Performed by: Personally  Anesthesiologist: [name], MD  Additional Notes: Discussed risks and benefits of nerve block including, but not limited to, prolonged and/or permanent nerve injury involving sensory and/or motor function. Monitors were applied and a time-out was performed. The nerve and associated structures were visualized under ultrasound guidance. After negative aspiration, local anesthetic was slowly injected around the nerve. There was no evidence of high pressure during the procedure. There were no paresthesias. VSS remained stable and the patient tolerated the procedure well.

## 2023-09-21 NOTE — Progress Notes (Signed)
Assisted Dr. Neoma Laming with right, interscalene , ultrasound guided block. Side rails up, monitors on throughout procedure. See vital signs in flow sheet. Tolerated Procedure well.

## 2023-09-22 ENCOUNTER — Encounter (HOSPITAL_BASED_OUTPATIENT_CLINIC_OR_DEPARTMENT_OTHER): Payer: Self-pay | Admitting: Orthopaedic Surgery

## 2023-09-25 DIAGNOSIS — M6281 Muscle weakness (generalized): Secondary | ICD-10-CM | POA: Diagnosis not present

## 2023-09-25 DIAGNOSIS — M19011 Primary osteoarthritis, right shoulder: Secondary | ICD-10-CM | POA: Diagnosis not present

## 2023-09-25 DIAGNOSIS — M25611 Stiffness of right shoulder, not elsewhere classified: Secondary | ICD-10-CM | POA: Diagnosis not present

## 2023-09-28 ENCOUNTER — Telehealth: Payer: Self-pay | Admitting: Internal Medicine

## 2023-09-28 ENCOUNTER — Encounter: Payer: Self-pay | Admitting: Internal Medicine

## 2023-09-28 ENCOUNTER — Other Ambulatory Visit: Payer: Self-pay

## 2023-09-28 DIAGNOSIS — I7121 Aneurysm of the ascending aorta, without rupture: Secondary | ICD-10-CM

## 2023-09-28 DIAGNOSIS — I712 Thoracic aortic aneurysm, without rupture, unspecified: Secondary | ICD-10-CM

## 2023-09-28 NOTE — Telephone Encounter (Signed)
Patient just had  Right shoulder hemiarthroplasty by Dr. Everardo Pacific  on October 31.  CT imaging ordered by Dr. 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 This is a new finding. Pt. Is in excellent physical condition and is an avid climber. Will refer to Dr. Leafy Ro at [facility] but need CT angio of chest in advance of appt per referral coordinator. Order has been placed.

## 2023-09-28 NOTE — Telephone Encounter (Signed)
Guadalupe Nickless [phone]  [name] called to say he has decided to go ahead and get to CT Cardiac Scoring test you guys had talked about some time ago. If you will put it in, he will schedule it.

## 2023-09-28 NOTE — Telephone Encounter (Signed)
Called patient to let him know she wants him to follow up with [facility] before scheduling any test at this time and I have put in that referral. He verbalized understanding.

## 2023-09-28 NOTE — Telephone Encounter (Unsigned)
Copied from CRM [id number]. Topic: General - Other >> Sep 28, 2023  1:12 PM Lovey Newcomer R wrote: Reason for CRM: Pt asking for a f/u for more information on the type of scan he should be scheduling for. Cb#[phone]

## 2023-09-29 DIAGNOSIS — M19011 Primary osteoarthritis, right shoulder: Secondary | ICD-10-CM | POA: Diagnosis not present

## 2023-09-29 NOTE — Telephone Encounter (Signed)
Copied from CRM [id number]. Topic: General - Other >> Sep 29, 2023 12:58 PM Larwance Sachs wrote: Reason for CRM: Patient called in to inform Dr.Baxley that he just had shoulder replacement surgery and will not be able to complete to the ct scan that was ordered for him. Patient spoke with orthopedic doctor who stated ct scan can not be completed for at least 4 weeks  Patient call back number is [phone]

## 2023-10-02 DIAGNOSIS — M6281 Muscle weakness (generalized): Secondary | ICD-10-CM | POA: Diagnosis not present

## 2023-10-02 DIAGNOSIS — F410 Panic disorder [episodic paroxysmal anxiety] without agoraphobia: Secondary | ICD-10-CM | POA: Diagnosis not present

## 2023-10-02 DIAGNOSIS — M19011 Primary osteoarthritis, right shoulder: Secondary | ICD-10-CM | POA: Diagnosis not present

## 2023-10-02 DIAGNOSIS — F411 Generalized anxiety disorder: Secondary | ICD-10-CM | POA: Diagnosis not present

## 2023-10-02 DIAGNOSIS — M25611 Stiffness of right shoulder, not elsewhere classified: Secondary | ICD-10-CM | POA: Diagnosis not present

## 2023-10-03 ENCOUNTER — Other Ambulatory Visit: Payer: Medicare Other

## 2023-10-04 DIAGNOSIS — M6281 Muscle weakness (generalized): Secondary | ICD-10-CM | POA: Diagnosis not present

## 2023-10-04 DIAGNOSIS — M25611 Stiffness of right shoulder, not elsewhere classified: Secondary | ICD-10-CM | POA: Diagnosis not present

## 2023-10-04 DIAGNOSIS — M19011 Primary osteoarthritis, right shoulder: Secondary | ICD-10-CM | POA: Diagnosis not present

## 2023-10-05 ENCOUNTER — Ambulatory Visit: Payer: Medicare Other | Admitting: Internal Medicine

## 2023-10-09 DIAGNOSIS — M19011 Primary osteoarthritis, right shoulder: Secondary | ICD-10-CM | POA: Diagnosis not present

## 2023-10-09 DIAGNOSIS — M6281 Muscle weakness (generalized): Secondary | ICD-10-CM | POA: Diagnosis not present

## 2023-10-09 DIAGNOSIS — M25611 Stiffness of right shoulder, not elsewhere classified: Secondary | ICD-10-CM | POA: Diagnosis not present

## 2023-10-11 DIAGNOSIS — M19011 Primary osteoarthritis, right shoulder: Secondary | ICD-10-CM | POA: Diagnosis not present

## 2023-10-11 DIAGNOSIS — M6281 Muscle weakness (generalized): Secondary | ICD-10-CM | POA: Diagnosis not present

## 2023-10-11 DIAGNOSIS — M25611 Stiffness of right shoulder, not elsewhere classified: Secondary | ICD-10-CM | POA: Diagnosis not present

## 2023-10-16 DIAGNOSIS — M19011 Primary osteoarthritis, right shoulder: Secondary | ICD-10-CM | POA: Diagnosis not present

## 2023-10-16 DIAGNOSIS — M6281 Muscle weakness (generalized): Secondary | ICD-10-CM | POA: Diagnosis not present

## 2023-10-16 DIAGNOSIS — M25611 Stiffness of right shoulder, not elsewhere classified: Secondary | ICD-10-CM | POA: Diagnosis not present

## 2023-10-23 DIAGNOSIS — M25611 Stiffness of right shoulder, not elsewhere classified: Secondary | ICD-10-CM | POA: Diagnosis not present

## 2023-10-23 DIAGNOSIS — M6281 Muscle weakness (generalized): Secondary | ICD-10-CM | POA: Diagnosis not present

## 2023-10-23 DIAGNOSIS — M19011 Primary osteoarthritis, right shoulder: Secondary | ICD-10-CM | POA: Diagnosis not present

## 2023-10-24 DIAGNOSIS — M19011 Primary osteoarthritis, right shoulder: Secondary | ICD-10-CM | POA: Diagnosis not present

## 2023-10-25 DIAGNOSIS — M25611 Stiffness of right shoulder, not elsewhere classified: Secondary | ICD-10-CM | POA: Diagnosis not present

## 2023-10-25 DIAGNOSIS — M19011 Primary osteoarthritis, right shoulder: Secondary | ICD-10-CM | POA: Diagnosis not present

## 2023-10-25 DIAGNOSIS — M6281 Muscle weakness (generalized): Secondary | ICD-10-CM | POA: Diagnosis not present

## 2023-10-30 DIAGNOSIS — M19011 Primary osteoarthritis, right shoulder: Secondary | ICD-10-CM | POA: Diagnosis not present

## 2023-10-30 DIAGNOSIS — M6281 Muscle weakness (generalized): Secondary | ICD-10-CM | POA: Diagnosis not present

## 2023-10-30 DIAGNOSIS — M25611 Stiffness of right shoulder, not elsewhere classified: Secondary | ICD-10-CM | POA: Diagnosis not present

## 2023-10-31 NOTE — Progress Notes (Addendum)
[address]       [place] [address]             [phone]    PCP is Baxley, Luanna Cole, MD Referring Provider is Baxley, Luanna Cole, MD  Chief Complaint: Possible ascending thoracic aortic aneurysm   HPI: This is a 72 year old male with a past medical history of OA, Hepatitis C (treated one year ago and viral load 0 after treatment, depression, and anxiety who during work up for chronic right shoulder pain and on preoperative planning on CT was found to have an incidental 4.2 cm ascending thoracic aortic aneurysm. He presents today to establish further surveillance of possible ATAA.  Patient is a retired [employer] account executive. He is very active. He participates in cross fit 2 times weekly, mountain bikes, hikes, and does rock climbing.He denies chest pain, pressure, or tightness.  Past Medical History:  Diagnosis Date   Anxiety    Depression    Hepatitis    Hep C treated 12 years ago viral load negative after a year of treatment    Primary localized osteoarthritis of right knee 09/19/2019   S/P TKR (total knee replacement), left   11/12/2018 09/19/2019    Past Surgical History:  Procedure Laterality Date   REPLACEMENT TOTAL KNEE Left 11/12/2018   SHOULDER HEMI-ARTHROPLASTY Right 09/21/2023   Procedure: SHOULDER HEMIARTHROPLASTY;  Surgeon: Bjorn Pippin, MD;  Location: [facility];  Service: Orthopedics;  Laterality: Right;   shouler arthroscopy     bilateral    TOTAL KNEE ARTHROPLASTY Right 09/30/2019   Procedure: TOTAL KNEE ARTHROPLASTY;  Surgeon: Salvatore Marvel, MD;  Location: [facility];  Service: Orthopedics;  Laterality: Right;    Family History  Adopted: Yes  Family history unknown: Yes  He was adopted so family history unknown  Social History Social History   Tobacco Use   Smoking status: Never   Smokeless tobacco: Never  Vaping Use   Vaping status: Never Used  Substance Use Topics   Alcohol use: Yes    Comment: daily 1-2 beers    Drug  use: Yes    Types: Marijuana    Comment: every day or two    Current Outpatient Medications  Medication Sig Dispense Refill   ALPRAZolam (XANAX) 0.5 MG tablet Take 1 tablet (0.5 mg total) by mouth 2 (two) times daily as needed for anxiety. (Patient taking differently: Take 0.5 mg by mouth as needed for anxiety.) 60 tablet 3   aspirin (ASPIRIN CHILDRENS) 81 MG chewable tablet Chew 1 tablet (81 mg total) by mouth 2 (two) times daily. For DVT prophylaxis after surgery 84 tablet 0   DULoxetine (CYMBALTA) 60 MG capsule TAKE 1 CAPSULE BY MOUTH EVERY DAY (Patient taking differently: Take 90 mg by mouth daily.) 90 capsule 1   pantoprazole (PROTONIX) 40 MG tablet TAKE ONE TABLET BY MOUTH DAILY FOR GERD 90 tablet 3   sildenafil (VIAGRA) 100 MG tablet Take 50-100 mg by mouth daily as needed for erectile dysfunction.      Allergies  Allergen Reactions   Ciprofloxacin Other (See Comments)    Pt states with his Cymbalta it makes his depression worst   Flagyl [Metronidazole] Other (See Comments)    Pt states when he takes with his Cymbalta it makes his depression worse.     Review of Systems  Chest Pain [ N ]  Exertional SOB [ N ]  Pedal Edema [  ] Syncope [ N ]  Presyncope [ N ]  General Review of Systems: [Y] = yes [ N]=no  Consitutional:   nausea Klaus.Mock ];  fever [ N];  Eye : blurred vision [ N]; Amaurosis fugax[ N ];  Resp: cough [ N];  hemoptysis[ N];  GI: melena[ N]; hematochezia [N];  WN:UUVOZDGUY[Q ]; Musculoskeletal: arthritis Klaus.Mock ];  Heme/Lymph: anemia[ N];  Neuro: TIA[ N];stroke[N ];  seizures[ N];  Endocrine: diabetes[ N];   Vital Signs: Vitals:   11/09/23 1255  BP: 139/78  Pulse: 70  Resp: 20  SpO2: 96%      Physical Exam CV-RRR, no murmur Neck-No carotid bruit Pulmonary-Clear to auscultation bilaterally Abdomen-Soft, non tender bowel sounds present Extremities-No LE edema.  Neurologic-Grossly intact without focal deficit   Diagnostic Tests:  Narrative & Impression   CLINICAL DATA:  72 year old male with aortic aneurysm   EXAM: CT ANGIOGRAPHY CHEST WITH CONTRAST   TECHNIQUE: Multidetector CT imaging of the chest was performed using the standard protocol during bolus administration of intravenous contrast. Multiplanar CT image reconstructions and MIPs were obtained to evaluate the vascular anatomy.   RADIATION DOSE REDUCTION: This exam was performed according to the departmental dose-optimization program which includes automated exposure control, adjustment of the mA and/or kV according to patient size and/or use of iterative reconstruction technique.   CONTRAST:  75mL ISOVUE-370 IOPAMIDOL (ISOVUE-370) INJECTION 76%   COMPARISON:  None Available.   FINDINGS: Cardiovascular:   Heart:   No cardiomegaly. No pericardial fluid/thickening. Calcifications of left main, left anterior descending, circumflex, right coronary arteries.   Aorta:   No significant aortic valve calcifications.   Greatest estimated diameter of the aortic annulus 30 mm on the coronal reformatted images.   Greatest estimated diameter of the sino-tubular junction, 31 mm on the coronal images.   Greatest estimated diameter of the ascending aorta on the axial images, 39 mm.   Minimal atherosclerotic changes of the aorta. Branch vessels are patent with common origin of the innominate artery and left common carotid artery. Cervical cerebral vessels patent at the base of the neck.   No pedunculated plaque, ulcerated plaque, dissection, periaortic fluid. No wall thickening.   Pulmonary arteries:   Timing of the contrast bolus is not optimized for evaluation of pulmonary artery filling defects. Unremarkable size of the main pulmonary artery.   Mediastinum/Nodes: No mediastinal adenopathy. Unremarkable appearance of the thoracic esophagus.   Unremarkable appearance of the thoracic inlet.   Lungs/Pleura: Central airways are clear. No pleural effusion.  No confluent airspace disease.   No pneumothorax.   Upper Abdomen: No acute finding of the upper abdomen.   Musculoskeletal: No acute displaced fracture. Degenerative changes of the spine.   Review of the MIP images confirms the above findings.   IMPRESSION: CT angiogram chest is negative for thoracic aortic aneurysm.   Mild aortic atherosclerosis, with left main and 3 vessel coronary artery disease. Aortic Atherosclerosis (ICD10-I70.0).   Signed,   Yvone Neu. Miachel Roux, RPVI   [facility]   [facility]     Electronically Signed   By: Gilmer Mor D.O.   On: 11/07/2023 14:48   Impression and Plan: We discussed the results of the CTA done 11/07/2023. CTA does not show evidence of a thoracic aortic aneurysm. We did discuss the natural history and and risk factors for growth of ascending aortic aneurysms.  We covered the importance of smoking cessation , tight blood pressure control, refraining from lifting heavy objects, and avoiding fluoroquinolones.  The patient  is aware of signs and symptoms of aortic dissection and when to present to the emergency department. I gave him the option of a repeat CTA in one year to see if an aortic aneurysm appears or not begin surveillance (greatest dimension measured  39 mm). He prefers to see [facility] PRN. I did discuss mild aortic atherosclerosis and coronary artery disease was also present. He was instructed if he has chest pain, tightness, etc or shortness of breath to seek medical attention.   Ardelle Balls, PA-C [facility] [phone]

## 2023-11-02 DIAGNOSIS — L814 Other melanin hyperpigmentation: Secondary | ICD-10-CM | POA: Diagnosis not present

## 2023-11-02 DIAGNOSIS — L821 Other seborrheic keratosis: Secondary | ICD-10-CM | POA: Diagnosis not present

## 2023-11-02 DIAGNOSIS — L218 Other seborrheic dermatitis: Secondary | ICD-10-CM | POA: Diagnosis not present

## 2023-11-02 DIAGNOSIS — D225 Melanocytic nevi of trunk: Secondary | ICD-10-CM | POA: Diagnosis not present

## 2023-11-06 ENCOUNTER — Ambulatory Visit
Admission: RE | Admit: 2023-11-06 | Discharge: 2023-11-06 | Disposition: A | Discharge status: Home / Self Care | Payer: Medicare Other | Source: Ambulatory Visit | Attending: Internal Medicine | Admitting: Internal Medicine

## 2023-11-06 DIAGNOSIS — I700 Atherosclerosis of aorta: Secondary | ICD-10-CM | POA: Diagnosis not present

## 2023-11-06 DIAGNOSIS — I2510 Atherosclerotic heart disease of native coronary artery without angina pectoris: Secondary | ICD-10-CM | POA: Diagnosis not present

## 2023-11-06 MED ORDER — IOPAMIDOL (ISOVUE-370) INJECTION 76%
75.0000 mL | Freq: Once | INTRAVENOUS | Status: AC | PRN
  Administered 2023-11-06: 75 mL via INTRAVENOUS

## 2023-11-09 ENCOUNTER — Institutional Professional Consult (permissible substitution): Payer: Medicare Other

## 2023-11-09 ENCOUNTER — Encounter: Payer: Self-pay | Admitting: Physician Assistant

## 2023-11-09 VITALS — BP 139/78 | HR 70 | Resp 20 | Ht 71.0 in | Wt 180.8 lb

## 2023-11-09 DIAGNOSIS — I712 Thoracic aortic aneurysm, without rupture, unspecified: Secondary | ICD-10-CM | POA: Insufficient documentation

## 2023-11-09 DIAGNOSIS — I7121 Aneurysm of the ascending aorta, without rupture: Secondary | ICD-10-CM | POA: Diagnosis not present

## 2023-11-09 NOTE — Patient Instructions (Signed)
Risk Modification in those with ascending thoracic aortic aneurysm:  Continue good control of blood pressure (prefer SBP 130/80 or less)  2. Avoid fluoroquinolone antibiotics (I.e Ciprofloxacin, Avelox, Levofloxacin, Ofloxacin)  3.  Use of statin (to decrease cardiovascular risk)-Lipid Profile done May 2024 showed Total Cholesterol 216,  HDL 74, Triglycerides 110, LDL 120. Will defer to primary if/when To initiate statin  4.  Exercise and activity limitations is individualized, but in general, contact sports are to be avoided and one should avoid heavy lifting (defined as half of ideal body weight) and exercises involving sustained Valsalva maneuver.  5. Counseling for those suspected of having genetically mediated disease. First-degree relatives of those with TAA disease should be screened as well as those who have a connective tissue disease (I.e with Marfan syndrome, Ehlers-Danlos syndrome,  and Loeys-Dietz syndrome) or a  bicuspid aortic valve,have an increased risk for complications related to TAA. Patient with no family history of connective tissue disease. He has not had an echocardiogram  6. He has no history of tobacco abuse

## 2023-11-27 DIAGNOSIS — M6281 Muscle weakness (generalized): Secondary | ICD-10-CM | POA: Diagnosis not present

## 2023-11-27 DIAGNOSIS — M19011 Primary osteoarthritis, right shoulder: Secondary | ICD-10-CM | POA: Diagnosis not present

## 2023-11-27 DIAGNOSIS — M25611 Stiffness of right shoulder, not elsewhere classified: Secondary | ICD-10-CM | POA: Diagnosis not present

## 2023-12-12 ENCOUNTER — Other Ambulatory Visit: Payer: Self-pay | Admitting: Orthopaedic Surgery

## 2023-12-12 DIAGNOSIS — M25512 Pain in left shoulder: Secondary | ICD-10-CM

## 2023-12-14 ENCOUNTER — Telehealth: Payer: Self-pay | Admitting: Internal Medicine

## 2023-12-14 NOTE — Telephone Encounter (Signed)
Called and scheduled appointment for surgery clearance

## 2023-12-14 NOTE — Telephone Encounter (Signed)
Received Surgery Clearance Form from [facility] Har[facility] / Dr Ramond Marrow for [facility] to have Left Hemi Shoulder Arthroplasty surgery with interscalene block anesthetic.  Scheduler Silvestre Mesi [phone]  Surgery date TBD  Last OV Right surgery clearance for 09/05/2023 Last labs BMET, Hemaoglobin A1C, CBC with Diff, 09/04/23  CPE scheduled in May 2025  Right Shoulder surgery was done 09/21/2023

## 2023-12-18 ENCOUNTER — Encounter: Payer: Self-pay | Admitting: Internal Medicine

## 2023-12-18 ENCOUNTER — Ambulatory Visit (INDEPENDENT_AMBULATORY_CARE_PROVIDER_SITE_OTHER): Payer: Medicare Other | Admitting: Internal Medicine

## 2023-12-18 ENCOUNTER — Ambulatory Visit
Admission: RE | Admit: 2023-12-18 | Discharge: 2023-12-18 | Disposition: A | Discharge status: Home / Self Care | Payer: Medicare Other | Source: Ambulatory Visit | Attending: Orthopaedic Surgery | Admitting: Orthopaedic Surgery

## 2023-12-18 VITALS — BP 128/80 | HR 72 | Ht 71.0 in | Wt 183.0 lb

## 2023-12-18 DIAGNOSIS — Z01818 Encounter for other preprocedural examination: Secondary | ICD-10-CM | POA: Diagnosis not present

## 2023-12-18 DIAGNOSIS — Z8659 Personal history of other mental and behavioral disorders: Secondary | ICD-10-CM | POA: Diagnosis not present

## 2023-12-18 DIAGNOSIS — M25512 Pain in left shoulder: Secondary | ICD-10-CM

## 2023-12-18 DIAGNOSIS — Z8619 Personal history of other infectious and parasitic diseases: Secondary | ICD-10-CM

## 2023-12-18 DIAGNOSIS — M19012 Primary osteoarthritis, left shoulder: Secondary | ICD-10-CM | POA: Diagnosis not present

## 2023-12-18 DIAGNOSIS — Z96651 Presence of right artificial knee joint: Secondary | ICD-10-CM | POA: Diagnosis not present

## 2023-12-18 DIAGNOSIS — K219 Gastro-esophageal reflux disease without esophagitis: Secondary | ICD-10-CM

## 2023-12-18 DIAGNOSIS — Z96652 Presence of left artificial knee joint: Secondary | ICD-10-CM

## 2023-12-18 DIAGNOSIS — Z96612 Presence of left artificial shoulder joint: Secondary | ICD-10-CM | POA: Diagnosis not present

## 2023-12-18 NOTE — Patient Instructions (Addendum)
Patient is medically cleared for upcoming surgery for left Hemi shoulder arthroplasty by Dr. Everardo Pacific.  Form signed and completed.  Has medicare wellness and CPE here on May 8th.
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
## 2023-12-19 DIAGNOSIS — M25611 Stiffness of right shoulder, not elsewhere classified: Secondary | ICD-10-CM | POA: Diagnosis not present

## 2023-12-19 DIAGNOSIS — M6281 Muscle weakness (generalized): Secondary | ICD-10-CM | POA: Diagnosis not present

## 2023-12-19 DIAGNOSIS — M19011 Primary osteoarthritis, right shoulder: Secondary | ICD-10-CM | POA: Diagnosis not present

## 2023-12-20 ENCOUNTER — Encounter: Payer: Self-pay | Admitting: Internal Medicine

## 2024-01-15 ENCOUNTER — Other Ambulatory Visit: Payer: Self-pay | Admitting: Internal Medicine

## 2024-01-22 ENCOUNTER — Other Ambulatory Visit: Payer: Self-pay

## 2024-01-22 ENCOUNTER — Encounter (HOSPITAL_BASED_OUTPATIENT_CLINIC_OR_DEPARTMENT_OTHER): Payer: Self-pay | Admitting: Orthopaedic Surgery

## 2024-01-22 ENCOUNTER — Other Ambulatory Visit: Payer: Self-pay | Admitting: Internal Medicine

## 2024-01-22 MED ORDER — SILDENAFIL CITRATE 100 MG PO TABS
50.0000 mg | ORAL_TABLET | Freq: Every day | ORAL | 99 refills | Status: AC | PRN
Start: 2024-01-22 — End: ?

## 2024-01-22 NOTE — Telephone Encounter (Signed)
 Copied from CRM [id number]. Topic: Clinical - Medication Refill >> Jan 22, 2024  9:12 AM Clayton Bibles wrote: Most Recent Primary Care Visit:  Provider: Margaree Mackintosh  Department: [facility]  Visit Type: OFFICE VISIT  Date: 12/18/2023  Medication: sildenafil (VIAGRA) 100 MG tablet  Has the patient contacted their pharmacy? Yes (Agent: If no, request that the patient contact the pharmacy for the refill. If patient does not wish to contact the pharmacy document the reason why and proceed with request.) (Agent: If yes, when and what did the pharmacy advise?) Pharmacy needs doctor's order to refill  Is this the correct pharmacy for this prescription? Yes - [facility] If no, delete pharmacy and type the correct one.  This is the patient's preferred pharmacy:  [facility] [id number] - [place], [place] - [address] [place] [address] Phone: [phone] Fax: [phone]   Has the prescription been filled recently? No  Is the patient out of the medication? Yes  Has the patient been seen for an appointment in the last year OR does the patient have an upcoming appointment? Yes  Can we respond through MyChart? Yes  Agent: Please be advised that Rx refills may take up to 3 business days. We ask that you follow-up with your pharmacy.

## 2024-01-22 NOTE — Progress Notes (Signed)
   01/22/24 1037  PAT Phone Screen  Is the patient taking a GLP-1 receptor agonist? No  Do You Have Diabetes? No  Do You Have Hypertension? No  Have You Ever Been to the ER for Asthma? No  Have You Taken Oral Steroids in the Past 3 Months? No  Do you Take Phenteramine or any Other Diet Drugs? No  Recent  Lab Work, EKG, CXR? Yes  Where was this test performed? [facility]  Do you have a history of heart problems? (S)  Yes (AAA, no complications)  Cardiologist Name Joycelyn Man,  Have you ever had tests on your heart? Yes  What cardiac tests were performed? EKG  What date/year were cardiac tests completed? 12/20/23  Results viewable: [facility] Media Tab  Any Recent Hospitalizations? No  Height 5\' 11"  (1.803 m)  Weight 80.7 kg  Pat Appointment Scheduled No  Reason for No Appointment Not Needed

## 2024-01-23 NOTE — H&P (Signed)
 PREOPERATIVE H&P  Chief Complaint: left shoulder OA  HPI: Paul [name] is a 73 y.o. male who is scheduled for Procedure(s): HEMIARTHROPLASTY, SHOULDER.   Patient has had left shoulder pain for quite some time. He had a right hemiarthroplasty and did great with this. He is ready to consider surgery on the left. He has tried and failed non-operative measures.   Symptoms are rated as moderate to severe, and have been worsening.  This is significantly impairing activities of daily living.    Please see clinic note for further details on this patient's care.    He has elected for surgical management.   Past Medical History:  Diagnosis Date   Anxiety    Depression    Hepatitis    Hep C treated 12 years ago viral load negative after a year of treatment    Primary localized osteoarthritis of right knee 09/19/2019   S/P TKR (total knee replacement), left   11/12/2018 09/19/2019   Past Surgical History:  Procedure Laterality Date   REPLACEMENT TOTAL KNEE Left 11/12/2018   SHOULDER HEMI-ARTHROPLASTY Right 09/21/2023   Procedure: SHOULDER HEMIARTHROPLASTY;  Surgeon: Bjorn Pippin, MD;  Location: [facility];  Service: Orthopedics;  Laterality: Right;   shouler arthroscopy     bilateral    TOTAL KNEE ARTHROPLASTY Right 09/30/2019   Procedure: TOTAL KNEE ARTHROPLASTY;  Surgeon: Salvatore Marvel, MD;  Location: [facility];  Service: Orthopedics;  Laterality: Right;   Social History   Socioeconomic History   Marital status: Divorced    Spouse name: Not on file   Number of children: Not on file   Years of education: Not on file   Highest education level: Associate degree: occupational, Scientist, product/process development, or vocational program  Occupational History   Occupation: nurse  Tobacco Use   Smoking status: Never   Smokeless tobacco: Never  Vaping Use   Vaping status: Never Used  Substance and Sexual Activity   Alcohol use: Yes    Comment: daily 2-3 beers    Drug use: Yes    Types:  Marijuana    Comment: every day or two   Sexual activity: Yes  Other Topics Concern   Not on file  Social History Narrative   Not on file   Social Drivers of Health   Financial Resource Strain: Low Risk  (09/04/2023)   Overall Financial Resource Strain (CARDIA)    Difficulty of Paying Living Expenses: Not hard at all  Food Insecurity: No Food Insecurity (09/04/2023)   Hunger Vital Sign    Worried About Running Out of Food in the Last Year: Never true    Ran Out of Food in the Last Year: Never true  Transportation Needs: No Transportation Needs (09/04/2023)   PRAPARE - Administrator, Civil Service (Medical): No    Lack of Transportation (Non-Medical): No  Physical Activity: Sufficiently Active (09/04/2023)   Exercise Vital Sign    Days of Exercise per Week: 6 days    Minutes of Exercise per Session: 120 min  Stress: No Stress Concern Present (09/04/2023)   [employer] of Occupational Health - Occupational Stress Questionnaire    Feeling of Stress : Only a little  Social Connections: Socially Isolated (09/04/2023)   Social Connection and Isolation Panel [NHANES]    Frequency of Communication with Friends and Family: Three times a week    Frequency of Social Gatherings with Friends and Family: Twice a week    Attends Religious Services: Never  Active Member of Clubs or Organizations: No    Attends Banker Meetings: Never    Marital Status: Divorced   Family History  Adopted: Yes  Family history unknown: Yes   Allergies  Allergen Reactions   Ciprofloxacin Other (See Comments)    Pt states with his Cymbalta it makes his depression worst   Flagyl [Metronidazole] Other (See Comments)    Pt states when he takes with his Cymbalta it makes his depression worse.    Prior to Admission medications   Medication Sig Start Date End Date Taking? Authorizing Provider  ALPRAZolam Prudy Feeler) 0.5 MG tablet Take 1 tablet (0.5 mg total) by mouth 2 (two) times  daily as needed for anxiety. Patient taking differently: Take 0.5 mg by mouth as needed for anxiety. 06/28/22  Yes Baxley, Luanna Cole, MD  DULoxetine (CYMBALTA) 60 MG capsule TAKE 1 CAPSULE BY MOUTH EVERY DAY Patient taking differently: Take 90 mg by mouth daily. 12/18/22  Yes Baxley, Luanna Cole, MD  pantoprazole (PROTONIX) 40 MG tablet TAKE ONE TABLET BY MOUTH DAILY FOR GERD 03/27/23  Yes Baxley, Luanna Cole, MD  sildenafil (VIAGRA) 100 MG tablet Take 0.5-1 tablets (50-100 mg total) by mouth daily as needed for erectile dysfunction. 01/22/24   Margaree Mackintosh, MD    ROS: All other systems have been reviewed and were otherwise negative with the exception of those mentioned in the HPI and as above.  Physical Exam: General: Alert, no acute distress Cardiovascular: No pedal edema Respiratory: No cyanosis, no use of accessory musculature GI: No organomegaly, abdomen is soft and non-tender Skin: No lesions in the area of chief complaint Neurologic: Sensation intact distally Psychiatric: Patient is competent for consent with normal mood and affect Lymphatic: No axillary or cervical lymphadenopathy  MUSCULOSKELETAL:  Bilateral shoulder range of motion is 0-170 degrees.  He has good cuff strength bilaterally.  The left is painful during range of motion and has some crepitus during range of motion.    Imaging: Xrays demonstrate end stage glenohumeral arthritis  BMI: Estimated body mass index is 24.83 kg/m as calculated from the following:   Height as of this encounter: 5\' 11"  (1.803 m).   Weight as of this encounter: 80.7 kg.  Lab Results  Component Value Date   ALBUMIN 3.9 05/16/2020   Diabetes: Patient does not have a diagnosis of diabetes. Lab Results  Component Value Date   HGBA1C 5.6 09/04/2023     Smoking Status:   reports that he has never smoked. He has never used smokeless tobacco.     Assessment: left shoulder OA  Plan: Plan for Procedure(s): HEMIARTHROPLASTY, SHOULDER  The risks  benefits and alternatives were discussed with the patient including but not limited to the risks of nonoperative treatment, versus surgical intervention including infection, bleeding, nerve injury,  blood clots, cardiopulmonary complications, morbidity, mortality, among others, and they were willing to proceed.   We additionally specifically discussed risks of axillary nerve injury, infection, periprosthetic fracture, continued pain and longevity of implants prior to beginning procedure.    Patient will be closely monitored in PACU for medical stabilization and pain control. If found stable in PACU, patient may be discharged home with outpatient follow-up. If any concerns regarding patient's stabilization patient will be admitted for observation after surgery. The patient is planning to be discharged home with outpatient PT.   The patient acknowledged the explanation, agreed to proceed with the plan and consent was signed.   Operative Plan: Left hemiarthroplasty Discharge Medications: standard  DVT Prophylaxis: aspirin Physical Therapy: outpatient PT Special Discharge needs: Sling.    Vernetta Honey, PA-C  01/23/2024 10:18 AM

## 2024-01-24 NOTE — Discharge Instructions (Signed)
 Ramond Marrow MD, MPH Alfonse Alpers, PA-C [facility] [address] [phone] (tel)   [phone] (fax)   POST-OPERATIVE INSTRUCTIONS - TOTAL SHOULDER REPLACEMENT    WOUND CARE You may leave the operative dressing in place until your follow-up appointment. KEEP THE INCISIONS CLEAN AND DRY. There may be a small amount of fluid/bleeding leaking at the surgical site. This is normal after surgery.  If it fills with liquid or blood please call us immediately to change it for you. Use the provided ice machine or Ice packs as often as possible for the first 3-4 days, then as needed for pain relief.   Keep a layer of cloth or a shirt between your skin and the cooling unit to prevent frost bite as it can get very cold.  SHOWERING: - You may shower on Post-Op Day #2.  - The dressing is water resistant but do not scrub it as it may start to peel up.   - You may remove the sling for showering - Gently pat the area dry.  - Do not soak the shoulder in water.  - Do not go swimming in the pool or ocean until your incision has completely healed (about 4-6 weeks after surgery) - KEEP THE INCISIONS CLEAN AND DRY.  EXERCISES Wear the sling at all times  You may remove the sling for showering, but keep the arm across the chest or in a secondary sling.    Accidental/Purposeful External Rotation and shoulder flexion (reaching behind you) is to be avoided at all costs for the first month. It is ok to come out of your sling if your are sitting and have assistance for eating.   Do not lift anything heavier than 1 pound until we discuss it further in clinic.  It is normal for your fingers/hand to become more swollen after surgery and discolored from bruising.   This will resolve over the first few weeks usually after surgery. Please continue to ambulate and do not stay sitting or lying for too long.  Perform foot and wrist pumps to assist in circulation.  PHYSICAL  THERAPY - You will begin physical therapy soon after surgery (unless otherwise specified) - Please call to set up an appointment, if you do not already have one  - Let our office if there are any issues with scheduling your therapy  - You have a physical therapy appointment scheduled at [facility] (across the hall from our office) on 3/10   REGIONAL ANESTHESIA (NERVE BLOCKS) The anesthesia team may have performed a nerve block for you this is a great tool used to minimize pain.   The block may start wearing off overnight (between 8-24 hours postop) When the block wears off, your pain may go from nearly zero to the pain you would have had postop without the block. This is an abrupt transition but nothing dangerous is happening.   This can be a challenging period but utilize your as needed pain medications to try and manage this period. We suggest you use the pain medication the first night prior to going to bed, to ease this transition.  You may take an extra dose of narcotic when this happens if needed   POST-OP MEDICATIONS- Multimodal approach to pain control In general your pain will be controlled with a combination of substances.  Prescriptions unless otherwise discussed are electronically sent to your pharmacy.  This is a carefully made plan we use to minimize narcotic use.  Celebrex - Anti-inflammatory medication taken on a scheduled basis Acetaminophen - Non-narcotic pain medicine taken on a scheduled basis  Oxycodone - This is a strong narcotic, to be used only on an "as needed" basis for SEVERE pain. Aspirin 81mg  - This medicine is used to minimize the risk of blood clots after surgery. Zofran -  take as needed for nausea   FOLLOW-UP If you develop a Fever (>101.5), Redness or Drainage from the surgical incision site, please call our office to arrange for an evaluation. Please call the office to schedule a follow-up appointment for a wound check, 7-10 days post-operatively.  IF  YOU HAVE ANY QUESTIONS, PLEASE FEEL FREE TO CALL OUR OFFICE.  HELPFUL INFORMATION  Your arm will be in a sling following surgery. You will be in this sling for the next 4 weeks.   You may be more comfortable sleeping in a semi-seated position the first few nights following surgery.  Keep a pillow propped under the elbow and forearm for comfort.  If you have a recliner type of chair it might be beneficial.  If not that is fine too, but it would be helpful to sleep propped up with pillows behind your operated shoulder as well under your elbow and forearm.  This will reduce pulling on the suture lines.  When dressing, put your operative arm in the sleeve first.  When getting undressed, take your operative arm out last.  Loose fitting, button-down shirts are recommended.  In most states it is against the law to drive while your arm is in a sling. And certainly against the law to drive while taking narcotics.  You may return to work/school in the next couple of days when you feel up to it. Desk work and typing in the sling is fine.  We suggest you use the pain medication the first night prior to going to bed, in order to ease any pain when the anesthesia wears off. You should avoid taking pain medications on an empty stomach as it will make you nauseous.  You should wean off your narcotic medicines as soon as you are able.     Most patients will be off narcotics before their first postop appointment.   Do not drink alcoholic beverages or take illicit drugs when taking pain medications.  Pain medication may make you constipated.  Below are a few solutions to try in this order: Decrease the amount of pain medication if you aren't having pain. Drink lots of decaffeinated fluids. Drink prune juice and/or each dried prunes  If the first 3 don't work start with additional solutions Take Colace - an over-the-counter stool softener Take Senokot - an over-the-counter laxative Take Miralax - a stronger  over-the-counter laxative   Dental Antibiotics:  In most cases prophylactic antibiotics for Dental procdeures after total joint surgery are not necessary.  Exceptions are as follows:  1. History of prior total joint infection  2. Severely immunocompromised (Organ Transplant, cancer chemotherapy, Rheumatoid biologic meds such as Humira)  3. Poorly controlled diabetes (A1C &gt; 8.0, blood glucose over 200)  If you have one of these conditions, contact your surgeon for an antibiotic prescription, prior to your dental procedure.   For more information including helpful videos and documents visit our website:   [email or web address]   No Tylenol before 12:30pm.   Post Anesthesia Home Care Instructions  Activity: Get plenty of rest for the remainder of the day. A responsible individual must stay with you for 24 hours following the procedure.  For the next 24 hours, DO NOT: -Drive a car -Advertising copywriter -Drink alcoholic beverages -Take any medication unless instructed by your physician -Make any legal decisions or sign important papers.  Meals: Start with liquid foods such as gelatin or soup. Progress to regular foods as tolerated. Avoid greasy, spicy, heavy foods. If nausea and/or vomiting occur, drink only clear liquids until the nausea and/or vomiting subsides. Call your physician if vomiting continues.  Special Instructions/Symptoms: Your throat may feel dry or sore from the anesthesia or the breathing tube placed in your throat during surgery. If this causes discomfort, gargle with warm salt water. The discomfort should disappear within 24 hours.      Regional Anesthesia Blocks  1. You may not be able to move or feel the "blocked" extremity after a regional anesthetic block. This may last may last from 3-48 hours after placement, but it will go away. The length of time depends on the medication injected and your individual response to  the medication. As the nerves start to wake up, you may experience tingling as the movement and feeling returns to your extremity. If the numbness and inability to move your extremity has not gone away after 48 hours, please call your surgeon.   2. The extremity that is blocked will need to be protected until the numbness is gone and the strength has returned. Because you cannot feel it, you will need to take extra care to avoid injury. Because it may be weak, you may have difficulty moving it or using it. You may not know what position it is in without looking at it while the block is in effect.  3. For blocks in the legs and feet, returning to weight bearing and walking needs to be done carefully. You will need to wait until the numbness is entirely gone and the strength has returned. You should be able to move your leg and foot normally before you try and bear weight or walk. You will need someone to be with you when you first try to ensure you do not fall and possibly risk injury.  4. Bruising and tenderness at the needle site are common side effects and will resolve in a few days.  5. Persistent numbness or new problems with movement should be communicated to the surgeon or the [facility] [phone] [facility] [phone]).

## 2024-01-25 ENCOUNTER — Encounter (HOSPITAL_BASED_OUTPATIENT_CLINIC_OR_DEPARTMENT_OTHER): Payer: Self-pay | Admitting: Orthopaedic Surgery

## 2024-01-25 ENCOUNTER — Ambulatory Visit (HOSPITAL_BASED_OUTPATIENT_CLINIC_OR_DEPARTMENT_OTHER)
Admission: RE | Admit: 2024-01-25 | Discharge: 2024-01-25 | Disposition: A | Discharge status: Home / Self Care | Payer: Medicare Other | Attending: Orthopaedic Surgery | Admitting: Orthopaedic Surgery

## 2024-01-25 ENCOUNTER — Ambulatory Visit (HOSPITAL_COMMUNITY)

## 2024-01-25 ENCOUNTER — Ambulatory Visit (HOSPITAL_BASED_OUTPATIENT_CLINIC_OR_DEPARTMENT_OTHER): Admitting: Anesthesiology

## 2024-01-25 ENCOUNTER — Other Ambulatory Visit: Payer: Self-pay

## 2024-01-25 ENCOUNTER — Encounter (HOSPITAL_BASED_OUTPATIENT_CLINIC_OR_DEPARTMENT_OTHER)
Admission: RE | Disposition: A | Discharge status: Home / Self Care | Payer: Self-pay | Source: Home / Self Care | Attending: Orthopaedic Surgery

## 2024-01-25 DIAGNOSIS — Z8619 Personal history of other infectious and parasitic diseases: Secondary | ICD-10-CM | POA: Insufficient documentation

## 2024-01-25 DIAGNOSIS — M19012 Primary osteoarthritis, left shoulder: Secondary | ICD-10-CM | POA: Insufficient documentation

## 2024-01-25 DIAGNOSIS — Z96611 Presence of right artificial shoulder joint: Secondary | ICD-10-CM | POA: Insufficient documentation

## 2024-01-25 DIAGNOSIS — M25712 Osteophyte, left shoulder: Secondary | ICD-10-CM | POA: Insufficient documentation

## 2024-01-25 DIAGNOSIS — G8918 Other acute postprocedural pain: Secondary | ICD-10-CM | POA: Diagnosis not present

## 2024-01-25 DIAGNOSIS — Z96612 Presence of left artificial shoulder joint: Secondary | ICD-10-CM | POA: Diagnosis not present

## 2024-01-25 HISTORY — PX: SHOULDER HEMI-ARTHROPLASTY: SHX5049

## 2024-01-25 SURGERY — HEMIARTHROPLASTY, SHOULDER
Anesthesia: General | Site: Shoulder | Laterality: Left

## 2024-01-25 MED ORDER — LIDOCAINE 2% (20 MG/ML) 5 ML SYRINGE
INTRAMUSCULAR | Status: DC | PRN
  Administered 2024-01-25: 20 mg via INTRAVENOUS

## 2024-01-25 MED ORDER — ONDANSETRON HCL 4 MG/2ML IJ SOLN
INTRAMUSCULAR | Status: DC | PRN
  Administered 2024-01-25: 4 mg via INTRAVENOUS

## 2024-01-25 MED ORDER — GLYCOPYRROLATE 0.2 MG/ML IJ SOLN
INTRAMUSCULAR | Status: DC | PRN
  Administered 2024-01-25 (×2): .1 mg via INTRAVENOUS

## 2024-01-25 MED ORDER — SODIUM CHLORIDE 0.9 % IR SOLN
Status: DC | PRN
Start: 2024-01-25 — End: 2024-01-25
  Administered 2024-01-25: 1000 mL

## 2024-01-25 MED ORDER — FENTANYL CITRATE (PF) 100 MCG/2ML IJ SOLN
100.0000 ug | Freq: Once | INTRAMUSCULAR | Status: AC
  Administered 2024-01-25: 50 ug via INTRAVENOUS

## 2024-01-25 MED ORDER — PHENYLEPHRINE HCL-NACL 20-0.9 MG/250ML-% IV SOLN
INTRAVENOUS | Status: DC | PRN
  Administered 2024-01-25: 40 ug/min via INTRAVENOUS

## 2024-01-25 MED ORDER — ROCURONIUM BROMIDE 10 MG/ML (PF) SYRINGE
PREFILLED_SYRINGE | INTRAVENOUS | Status: AC
  Filled 2024-01-25: qty 10

## 2024-01-25 MED ORDER — MIDAZOLAM HCL 5 MG/5ML IJ SOLN
INTRAMUSCULAR | Status: DC | PRN
  Administered 2024-01-25: 1 mg via INTRAVENOUS

## 2024-01-25 MED ORDER — CELECOXIB 100 MG PO CAPS: 100.0000 mg | ORAL_CAPSULE | Freq: Two times a day (BID) | ORAL | 0 refills | Status: AC

## 2024-01-25 MED ORDER — PHENYLEPHRINE 80 MCG/ML (10ML) SYRINGE FOR IV PUSH (FOR BLOOD PRESSURE SUPPORT)
PREFILLED_SYRINGE | INTRAVENOUS | Status: AC
  Filled 2024-01-25: qty 10

## 2024-01-25 MED ORDER — LACTATED RINGERS IV SOLN: INTRAVENOUS | Status: DC

## 2024-01-25 MED ORDER — AMISULPRIDE (ANTIEMETIC) 5 MG/2ML IV SOLN: 10.0000 mg | Freq: Once | INTRAVENOUS | Status: DC | PRN

## 2024-01-25 MED ORDER — MIDAZOLAM HCL 2 MG/2ML IJ SOLN
INTRAMUSCULAR | Status: AC
  Filled 2024-01-25: qty 2

## 2024-01-25 MED ORDER — SUGAMMADEX SODIUM 200 MG/2ML IV SOLN
INTRAVENOUS | Status: DC | PRN
  Administered 2024-01-25: 200 mg via INTRAVENOUS

## 2024-01-25 MED ORDER — MIDAZOLAM HCL 2 MG/2ML IJ SOLN
2.0000 mg | Freq: Once | INTRAMUSCULAR | Status: AC
  Administered 2024-01-25: 2 mg via INTRAVENOUS

## 2024-01-25 MED ORDER — PHENYLEPHRINE 80 MCG/ML (10ML) SYRINGE FOR IV PUSH (FOR BLOOD PRESSURE SUPPORT)
PREFILLED_SYRINGE | INTRAVENOUS | Status: DC | PRN
Start: 2024-01-25 — End: 2024-01-25
  Administered 2024-01-25 (×2): 160 ug via INTRAVENOUS

## 2024-01-25 MED ORDER — DEXAMETHASONE SODIUM PHOSPHATE 10 MG/ML IJ SOLN
INTRAMUSCULAR | Status: AC
  Filled 2024-01-25: qty 1

## 2024-01-25 MED ORDER — ACETAMINOPHEN 500 MG PO TABS: 1000.0000 mg | ORAL_TABLET | Freq: Three times a day (TID) | ORAL | 0 refills | Status: AC

## 2024-01-25 MED ORDER — TRANEXAMIC ACID-NACL 1000-0.7 MG/100ML-% IV SOLN
1000.0000 mg | INTRAVENOUS | Status: AC
  Administered 2024-01-25: 1000 mg via INTRAVENOUS

## 2024-01-25 MED ORDER — OXYCODONE HCL 5 MG PO TABS: ORAL_TABLET | ORAL | 0 refills | Status: AC

## 2024-01-25 MED ORDER — ONDANSETRON HCL 4 MG/2ML IJ SOLN
INTRAMUSCULAR | Status: AC
  Filled 2024-01-25: qty 2

## 2024-01-25 MED ORDER — BUPIVACAINE LIPOSOME 1.3 % IJ SUSP
INTRAMUSCULAR | Status: DC | PRN
  Administered 2024-01-25: 10 mL via PERINEURAL

## 2024-01-25 MED ORDER — VANCOMYCIN HCL 1000 MG IV SOLR
INTRAVENOUS | Status: DC | PRN
  Administered 2024-01-25: 1000 mg via TOPICAL

## 2024-01-25 MED ORDER — FENTANYL CITRATE (PF) 100 MCG/2ML IJ SOLN
INTRAMUSCULAR | Status: AC
  Filled 2024-01-25: qty 2

## 2024-01-25 MED ORDER — VANCOMYCIN HCL 1000 MG IV SOLR
INTRAVENOUS | Status: AC
  Filled 2024-01-25: qty 60

## 2024-01-25 MED ORDER — DEXAMETHASONE SODIUM PHOSPHATE 10 MG/ML IJ SOLN
INTRAMUSCULAR | Status: DC | PRN
  Administered 2024-01-25: 5 mg via INTRAVENOUS

## 2024-01-25 MED ORDER — BUPIVACAINE-EPINEPHRINE (PF) 0.5% -1:200000 IJ SOLN
INTRAMUSCULAR | Status: DC | PRN
Start: 2024-01-25 — End: 2024-01-25
  Administered 2024-01-25: 15 mL via PERINEURAL

## 2024-01-25 MED ORDER — CEFAZOLIN SODIUM-DEXTROSE 2-4 GM/100ML-% IV SOLN
2.0000 g | INTRAVENOUS | Status: AC
  Administered 2024-01-25: 2 g via INTRAVENOUS

## 2024-01-25 MED ORDER — ACETAMINOPHEN 500 MG PO TABS
1000.0000 mg | ORAL_TABLET | Freq: Once | ORAL | Status: AC
  Administered 2024-01-25: 1000 mg via ORAL

## 2024-01-25 MED ORDER — ONDANSETRON HCL 4 MG PO TABS: 4.0000 mg | ORAL_TABLET | Freq: Three times a day (TID) | ORAL | 0 refills | Status: AC | PRN

## 2024-01-25 MED ORDER — EPHEDRINE SULFATE (PRESSORS) 50 MG/ML IJ SOLN
INTRAMUSCULAR | Status: DC | PRN
  Administered 2024-01-25: 5 mg via INTRAVENOUS
  Administered 2024-01-25: 10 mg via INTRAVENOUS
  Administered 2024-01-25: 5 mg via INTRAVENOUS
  Administered 2024-01-25: 10 mg via INTRAVENOUS

## 2024-01-25 MED ORDER — PROPOFOL 10 MG/ML IV BOLUS
INTRAVENOUS | Status: AC
  Filled 2024-01-25: qty 20

## 2024-01-25 MED ORDER — FENTANYL CITRATE (PF) 100 MCG/2ML IJ SOLN: 25.0000 ug | INTRAMUSCULAR | Status: DC | PRN

## 2024-01-25 MED ORDER — FENTANYL CITRATE (PF) 100 MCG/2ML IJ SOLN
INTRAMUSCULAR | Status: DC | PRN
Start: 2024-01-25 — End: 2024-01-25
  Administered 2024-01-25: 50 ug via INTRAVENOUS

## 2024-01-25 MED ORDER — ASPIRIN 81 MG PO CHEW
81.0000 mg | CHEWABLE_TABLET | Freq: Two times a day (BID) | ORAL | 0 refills | Status: AC
Start: 2024-01-25 — End: 2024-03-07

## 2024-01-25 MED ORDER — LIDOCAINE 2% (20 MG/ML) 5 ML SYRINGE
INTRAMUSCULAR | Status: AC
  Filled 2024-01-25: qty 5

## 2024-01-25 MED ORDER — EPHEDRINE 5 MG/ML INJ
INTRAVENOUS | Status: AC
  Filled 2024-01-25: qty 5

## 2024-01-25 MED ORDER — GABAPENTIN 300 MG PO CAPS
300.0000 mg | ORAL_CAPSULE | Freq: Once | ORAL | Status: AC
  Administered 2024-01-25: 300 mg via ORAL

## 2024-01-25 MED ORDER — ROCURONIUM BROMIDE 10 MG/ML (PF) SYRINGE
PREFILLED_SYRINGE | INTRAVENOUS | Status: DC | PRN
  Administered 2024-01-25: 50 mg via INTRAVENOUS
  Administered 2024-01-25: 10 mg via INTRAVENOUS

## 2024-01-25 MED ORDER — PROPOFOL 10 MG/ML IV BOLUS
INTRAVENOUS | Status: DC | PRN
  Administered 2024-01-25: 200 mg via INTRAVENOUS

## 2024-01-25 SURGICAL SUPPLY — 56 items
BLADE SAW SGTL 73X25 THK (BLADE) ×1 IMPLANT
BLADE SURG 10 STRL SS (BLADE) IMPLANT
BLADE SURG 15 STRL LF DISP TIS (BLADE) IMPLANT
CHLORAPREP W/TINT 26 (MISCELLANEOUS) ×1 IMPLANT
CLSR STERI-STRIP ANTIMIC 1/2X4 (GAUZE/BANDAGES/DRESSINGS) ×1 IMPLANT
COOLER ICEMAN CLASSIC (MISCELLANEOUS) ×1 IMPLANT
COVER BACK TABLE 60X90IN (DRAPES) ×1 IMPLANT
COVER MAYO STAND STRL (DRAPES) ×1 IMPLANT
DRAPE IMP U-DRAPE 54X76 (DRAPES) IMPLANT
DRAPE INCISE IOBAN 66X45 STRL (DRAPES) ×1 IMPLANT
DRAPE POUCH INSTRU U-SHP 10X18 (DRAPES) ×1 IMPLANT
DRAPE U-SHAPE 76X120 STRL (DRAPES) ×2 IMPLANT
DRSG AQUACEL AG ADV 3.5X 6 (GAUZE/BANDAGES/DRESSINGS) ×1 IMPLANT
ELECT BLADE 4.0 EZ CLEAN MEGAD (MISCELLANEOUS) ×1 IMPLANT
ELECT REM PT RETURN 9FT ADLT (ELECTROSURGICAL) ×1 IMPLANT
ELECTRODE BLDE 4.0 EZ CLN MEGD (MISCELLANEOUS) ×1 IMPLANT
ELECTRODE REM PT RTRN 9FT ADLT (ELECTROSURGICAL) ×1 IMPLANT
FACESHIELD WRAPAROUND (MASK) ×2 IMPLANT
FACESHIELD WRAPAROUND OR TEAM (MASK) ×2 IMPLANT
GAUZE XEROFORM 1X8 LF (GAUZE/BANDAGES/DRESSINGS) IMPLANT
GLOVE BIO SURGEON STRL SZ 6.5 (GLOVE) ×2 IMPLANT
GLOVE BIOGEL PI IND STRL 6.5 (GLOVE) ×1 IMPLANT
GLOVE BIOGEL PI IND STRL 8 (GLOVE) ×1 IMPLANT
GLOVE ECLIPSE 8.0 STRL XLNG CF (GLOVE) ×3 IMPLANT
GOWN STRL REUS W/ TWL LRG LVL3 (GOWN DISPOSABLE) ×2 IMPLANT
GOWN STRL REUS W/TWL XL LVL3 (GOWN DISPOSABLE) ×1 IMPLANT
HEAD HUM HIGH OFFSET 4 (Orthopedic Implant) IMPLANT
HUMERAL STEM AEQUALIS 3X74 (Shoulder) ×1 IMPLANT
KIT STABILIZATION SHOULDER (MISCELLANEOUS) ×1 IMPLANT
MANIFOLD NEPTUNE II (INSTRUMENTS) ×1 IMPLANT
NDL MAYO TROCAR (NEEDLE) ×1 IMPLANT
NEEDLE MAYO TROCAR (NEEDLE) ×1 IMPLANT
NS IRRIG 1000ML POUR BTL (IV SOLUTION) ×1 IMPLANT
PACK BASIN DAY SURGERY FS (CUSTOM PROCEDURE TRAY) ×1 IMPLANT
PACK SHOULDER (CUSTOM PROCEDURE TRAY) ×1 IMPLANT
PAD COLD SHLDR WRAP-ON (PAD) ×1 IMPLANT
PENCIL SMOKE EVACUATOR (MISCELLANEOUS) IMPLANT
RESTRAINT HEAD UNIVERSAL NS (MISCELLANEOUS) ×1 IMPLANT
SET HNDPC FAN SPRY TIP SCT (DISPOSABLE) ×1 IMPLANT
SHEET MEDIUM DRAPE 40X70 STRL (DRAPES) ×1 IMPLANT
SLEEVE SCD COMPRESS KNEE MED (STOCKING) ×1 IMPLANT
SMARTMIX MINI TOWER (MISCELLANEOUS) ×1 IMPLANT
SPONGE T-LAP 18X18 ~~LOC~~+RFID (SPONGE) IMPLANT
STEM HUMERAL AEQUALIS 3X74 (Shoulder) IMPLANT
SUT ETHIBOND 2 V 37 (SUTURE) ×1 IMPLANT
SUT ETHIBOND NAB CT1 #1 30IN (SUTURE) ×1 IMPLANT
SUT ETHILON 3 0 PS 1 (SUTURE) IMPLANT
SUT FIBERWIRE #2 38 REV NDL BL (SUTURE) ×1 IMPLANT
SUT FIBERWIRE #5 38 CONV NDL (SUTURE) ×6 IMPLANT
SUT MNCRL AB 4-0 PS2 18 (SUTURE) ×1 IMPLANT
SUT VIC AB 3-0 SH 27X BRD (SUTURE) ×1 IMPLANT
SUTURE FIBERWR #5 38 CONV NDL (SUTURE) ×6 IMPLANT
SUTURE FIBERWR#2 38 REV NDL BL (SUTURE) IMPLANT
TOWEL GREEN STERILE FF (TOWEL DISPOSABLE) ×3 IMPLANT
TOWER SMARTMIX MINI (MISCELLANEOUS) ×1 IMPLANT
TUBE SUCTION HIGH CAP CLEAR NV (SUCTIONS) ×1 IMPLANT

## 2024-01-25 NOTE — Anesthesia Postprocedure Evaluation (Signed)
 Anesthesia Post Note  Patient: Paul [name]  Procedure(s) Performed: HEMIARTHROPLASTY, SHOULDER (Left: Shoulder)     Patient location during evaluation: PACU Anesthesia Type: General Level of consciousness: awake and alert Pain management: pain level controlled Vital Signs Assessment: post-procedure vital signs reviewed and stable Respiratory status: spontaneous breathing, nonlabored ventilation, respiratory function stable and patient connected to nasal cannula oxygen Cardiovascular status: blood pressure returned to baseline and stable Postop Assessment: no apparent nausea or vomiting Anesthetic complications: no  No notable events documented.  Last Vitals:  Vitals:   01/25/24 1010 01/25/24 1028  BP:  119/74  Pulse: 83 87  Resp: 15 18  Temp:  (!) 36.2 C  SpO2: 96% 96%    Last Pain:  Vitals:   01/25/24 1028  TempSrc:   PainSc: 0-No pain                 Kennieth Rad

## 2024-01-25 NOTE — Anesthesia Procedure Notes (Signed)
 Anesthesia Regional Block: Interscalene brachial plexus block   Pre-Anesthetic Checklist: , timeout performed,  Correct Patient, Correct Site, Correct Laterality,  Correct Procedure, Correct Position, site marked,  Risks and benefits discussed,  Surgical consent,  Pre-op evaluation,  At surgeon's request and post-op pain management  Laterality: Left  Prep: chloraprep       Needles:  Injection technique: Single-shot  Needle Type: Echogenic Needle     Needle Length: 9cm  Needle Gauge: 21     Additional Needles:   Procedures:,,,, ultrasound used (permanent image in chart),,    Narrative:  Start time: 01/25/2024 7:45 AM End time: 01/25/2024 7:50 AM Injection made incrementally with aspirations every 5 mL.  Performed by: Personally  Anesthesiologist: Marcene Duos, MD

## 2024-01-25 NOTE — Transfer of Care (Signed)
 Immediate Anesthesia Transfer of Care Note  Patient: Paul [name]  Procedure(s) Performed: HEMIARTHROPLASTY, SHOULDER (Left: Shoulder)  Patient Location: PACU  Anesthesia Type:General  Level of Consciousness: awake and patient cooperative  Airway & Oxygen Therapy: Patient Spontanous Breathing and Patient connected to nasal cannula oxygen  Post-op Assessment: Report given to RN and Post -op Vital signs reviewed and stable  Post vital signs: Reviewed and stable  Last Vitals:  Vitals Value Taken Time  BP 107/58 01/25/24 0931  Temp    Pulse 87 01/25/24 0934  Resp 19 01/25/24 0934  SpO2 95 % 01/25/24 0934  Vitals shown include unfiled device data.  Last Pain:  Vitals:   01/25/24 0637  TempSrc: Temporal  PainSc: 0-No pain      Patients Stated Pain Goal: 4 (01/25/24 5784)  Complications: No notable events documented.

## 2024-01-25 NOTE — Progress Notes (Signed)
AssistedDr. Rob Fitzgerald with left, interscalene , ultrasound guided block. Side rails up, monitors on throughout procedure. See vital signs in flow sheet. Tolerated Procedure well.  

## 2024-01-25 NOTE — Anesthesia Procedure Notes (Signed)
 Procedure Name: Intubation Date/Time: 01/25/2024 8:10 AM  Performed by: Bishop Limbo, CRNAPre-anesthesia Checklist: Patient identified, Emergency Drugs available, Suction available and Patient being monitored Patient Re-evaluated:Patient Re-evaluated prior to induction Oxygen Delivery Method: Circle System Utilized Preoxygenation: Pre-oxygenation with 100% oxygen Induction Type: IV induction Ventilation: Mask ventilation without difficulty Laryngoscope Size: Mac and 4 Grade View: Grade II Tube type: Oral Tube size: 7.0 mm Number of attempts: 1 Airway Equipment and Method: Stylet and Bite block Placement Confirmation: ETT inserted through vocal cords under direct vision, positive ETCO2 and breath sounds checked- equal and bilateral Secured at: 22 cm Tube secured with: Tape Dental Injury: Teeth and Oropharynx as per pre-operative assessment

## 2024-01-25 NOTE — Anesthesia Preprocedure Evaluation (Signed)
 Anesthesia Evaluation  Patient identified by MRN, date of birth, ID band Patient awake    Reviewed: Allergy & Precautions, NPO status , Patient's Chart, lab work & pertinent test results  Airway Mallampati: II  TM Distance: >3 FB Neck ROM: Full    Dental   Pulmonary neg pulmonary ROS   breath sounds clear to auscultation       Cardiovascular negative cardio ROS  Rhythm:Regular Rate:Normal     Neuro/Psych negative neurological ROS     GI/Hepatic negative GI ROS,,,(+) Hepatitis -, C  Endo/Other  negative endocrine ROS    Renal/GU negative Renal ROS     Musculoskeletal  (+) Arthritis ,    Abdominal   Peds  Hematology negative hematology ROS (+)   Anesthesia Other Findings   Reproductive/Obstetrics                             Anesthesia Physical Anesthesia Plan  ASA: 2  Anesthesia Plan: General   Post-op Pain Management: Regional block* and Tylenol PO (pre-op)*   Induction: Intravenous  PONV Risk Score and Plan: 2 and Dexamethasone, Ondansetron and Treatment may vary due to age or medical condition  Airway Management Planned: Oral ETT  Additional Equipment: None  Intra-op Plan:   Post-operative Plan: Extubation in OR  Informed Consent: I have reviewed the patients History and Physical, chart, labs and discussed the procedure including the risks, benefits and alternatives for the proposed anesthesia with the patient or authorized representative who has indicated his/her understanding and acceptance.     Dental advisory given  Plan Discussed with: CRNA  Anesthesia Plan Comments:        Anesthesia Quick Evaluation

## 2024-01-25 NOTE — Interval H&P Note (Signed)
 All questions answered, patient wants to proceed with procedure. ? ?

## 2024-01-25 NOTE — Op Note (Signed)
 Orthopaedic Surgery Operative Note (CSN: [id number])  Paul [name]  03-26-1951 Date of Surgery: 01/25/2024   Diagnoses:  left shoulder Osteoartritis  Procedure: Left pyrocarbon hemiarthroplasty of shoulder    Operative Finding Successful completion of planned procedure.   Post-operative plan: The patient will be NWB in sling.  The patient will be will be discharged from PACU if continues to be stable as was plan prior to surgery.  DVT prophylaxis Aspirin 81 mg twice daily for 6 weeks.  Pain control with PRN pain medication preferring oral medicines.  Follow up plan will be scheduled in approximately 7 days for incision check and XR.  Physical therapy to start immediately.  Implants:Tornier flex size 3c stem, 46 high offset pyrocarbon head  Post-Op Diagnosis: Same Surgeons:Primary: Bjorn Pippin, MD Assistants:Caroline McBane, PA-C Location: [facility] OR ROOM 1 Anesthesia: General with Exparel Interscalene Antibiotics: Ancef 2g preop, Vancomycin 1000mg  locally Tourniquet time: None Estimated Blood Loss: 100 Complications: None Specimens: None Implants: Implant Name Type Inv. Item Serial No. Manufacturer Lot No. LRB No. Used Action  HUMERAL STEM AEQUALIS 3X74 - [id number] Shoulder HUMERAL STEM AEQUALIS 3X74 [id number] [employer]  Left 1 Implanted  HEAD HUM HIGH OFFSET 4 - [id number] Orthopedic Implant HEAD HUM HIGH OFFSET 4 [id number] [employer]  Left 1 Implanted    Indications for Surgery:   Paul [name] is a 73 y.o. male with end-stage arthritis of the joint.  Benefits and risks of operative and nonoperative management were discussed prior to surgery with patient/guardian(s) and informed consent form was completed.  Infection and need for further surgery were discussed as was prosthetic stability and cuff issues.  We additionally specifically discussed risks of axillary nerve injury, infection, periprosthetic fracture, continued pain and longevity of implants prior to beginning  procedure.      Procedure:   The patient was identified in the preoperative holding area where the surgical site was marked. Block placed by anesthesia with exparel.  The patient was taken to the OR where a procedural timeout was called and the above noted anesthesia was induced.  The patient was positioned beachchair on allen table with spider arm positioner.  Preoperative antibiotics were dosed.  The patient's left shoulder was prepped and draped in the usual sterile fashion.  A second preoperative timeout was called.       Standard deltopectoral approach was performed with a #10 blade. We dissected down to the subcutaneous tissues and the cephalic vein was taken laterally with the deltoid. Clavipectoral fascia was incised in line with the incision. Deep retractors were placed. The long of the biceps tendon was identified and there was significant tenosynovitis present.  Tenodesis was performed to the pectoralis tendon with #2 Ethibond. The remaining biceps was followed up into the rotator interval where it was released. The subscapularis was taken down with a lesser tuberosity osteotomy using an osteotome. The osteotomy fragment and underlying capsular elevated off of the humeral neck and the osteophytes inferiorly. #2 Ethibond sutures are passed through the bone tendon junction for subscap manipulation. We continued releasing the capsule directly off of the osteophytes inferiorly all the way around the corner. This allowed [place] to dislocate the humeral head. The humeral head had evidence of severe osteoarthritic wear with full-thickness cartilage loss and exposed subchondral bone. There was significant flattening of the humeral head.   The rotator cuff was carefully examined and noted to be intact without sign of wear.  The decision was confirmed that an hemiarthroplasty shoulder was indicated for  this patient.  There were osteophytes along the inferior humeral neck. The osteophytes were removed with  an osteotome and a rongeur.  Osteophytes were removed with a rongeur and an osteotome and the anatomic neck was well visualized.   We next made our humeral osteotomy with an oscillating saw along the anatomic neck. The head fragment was passed off the back table and measured approximately 45 mm in diameter.   A starter awl was used to open the humeral canal. We reamed with T-handle sound  reamers up an appropriate fit. The chisel was used to remove proximal humeral bone. We then broached starting with a size 1 broach increasing to size 2 which had secure and stable fit. The inclination was set at C. The broach handle was removed and a cut protector was placed.   We trialed with multiple size head options and selected a 43 which re-created the patient's anatomy. The offset was dialed in to match the normal anatomy. The shoulder was trialed.  There was good ROM in all planes and the shoulder was stable with approximately 50% posterior spring back and no inferior translation.  The real humeral implants were opened on the back table and assembled.  The trial was removed. #5 Fiberwire sutures passed through the humeral neck for subscap repair. The humeral component was press-fit obtaining a secure fit.  The joint was reduced and thoroughly irrigated with pulsatile lavage. Subscap and lesser tuberosity osteotomy repaired back in a double row fashion with #5 Fiberwire sutures through bone tunnels. Next the rotator interval was closed with #2 ethibond suture. Hemostasis was obtained. The deltopectoral interval was reapproximated with #1 Ethibond. The subcutaneous tissues were closed with 2-0 Vicryl and the skin was closed with a running monocryl. The incisions were cleaned and dried and an Aquacel dressing was placed. The drapes taken down. The arm was placed into sling with abduction pillow. Patient was awakened, extubated, and transferred to the recovery room in stable condition. There were no intraoperative  complications. The sponge, needle, and attention counts were correct at the end of the case.     Alfonse Alpers, PA-C, present and scrubbed throughout the case, critical for completion in a timely fashion, and for retraction, instrumentation, closure.

## 2024-01-26 ENCOUNTER — Encounter (HOSPITAL_BASED_OUTPATIENT_CLINIC_OR_DEPARTMENT_OTHER): Payer: Self-pay | Admitting: Orthopaedic Surgery

## 2024-01-29 DIAGNOSIS — M6281 Muscle weakness (generalized): Secondary | ICD-10-CM | POA: Diagnosis not present

## 2024-01-29 DIAGNOSIS — M25612 Stiffness of left shoulder, not elsewhere classified: Secondary | ICD-10-CM | POA: Diagnosis not present

## 2024-01-29 DIAGNOSIS — M19012 Primary osteoarthritis, left shoulder: Secondary | ICD-10-CM | POA: Diagnosis not present

## 2024-02-02 DIAGNOSIS — M19012 Primary osteoarthritis, left shoulder: Secondary | ICD-10-CM | POA: Diagnosis not present

## 2024-02-07 DIAGNOSIS — M25612 Stiffness of left shoulder, not elsewhere classified: Secondary | ICD-10-CM | POA: Diagnosis not present

## 2024-02-07 DIAGNOSIS — M6281 Muscle weakness (generalized): Secondary | ICD-10-CM | POA: Diagnosis not present

## 2024-02-07 DIAGNOSIS — M19012 Primary osteoarthritis, left shoulder: Secondary | ICD-10-CM | POA: Diagnosis not present

## 2024-02-22 DIAGNOSIS — M25612 Stiffness of left shoulder, not elsewhere classified: Secondary | ICD-10-CM | POA: Diagnosis not present

## 2024-02-22 DIAGNOSIS — M19012 Primary osteoarthritis, left shoulder: Secondary | ICD-10-CM | POA: Diagnosis not present

## 2024-02-22 DIAGNOSIS — M6281 Muscle weakness (generalized): Secondary | ICD-10-CM | POA: Diagnosis not present

## 2024-02-27 DIAGNOSIS — M19012 Primary osteoarthritis, left shoulder: Secondary | ICD-10-CM | POA: Diagnosis not present

## 2024-03-06 DIAGNOSIS — M6281 Muscle weakness (generalized): Secondary | ICD-10-CM | POA: Diagnosis not present

## 2024-03-06 DIAGNOSIS — M19012 Primary osteoarthritis, left shoulder: Secondary | ICD-10-CM | POA: Diagnosis not present

## 2024-03-06 DIAGNOSIS — M25612 Stiffness of left shoulder, not elsewhere classified: Secondary | ICD-10-CM | POA: Diagnosis not present

## 2024-03-25 ENCOUNTER — Other Ambulatory Visit: Payer: Medicare Other

## 2024-03-25 DIAGNOSIS — Z125 Encounter for screening for malignant neoplasm of prostate: Secondary | ICD-10-CM | POA: Diagnosis not present

## 2024-03-25 DIAGNOSIS — I712 Thoracic aortic aneurysm, without rupture, unspecified: Secondary | ICD-10-CM

## 2024-03-25 DIAGNOSIS — E785 Hyperlipidemia, unspecified: Secondary | ICD-10-CM

## 2024-03-25 DIAGNOSIS — K219 Gastro-esophageal reflux disease without esophagitis: Secondary | ICD-10-CM

## 2024-03-25 DIAGNOSIS — Z0000 Encounter for general adult medical examination without abnormal findings: Secondary | ICD-10-CM
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
## 2024-03-26 LAB — CBC WITH DIFFERENTIAL/PLATELET
Absolute Lymphocytes: 1102 {cells}/uL (ref 850–3900)
Absolute Monocytes: 487 {cells}/uL (ref 200–950)
Basophils Absolute: 29 {cells}/uL (ref 0–200)
Basophils Relative: 0.5 %
Eosinophils Absolute: 128 {cells}/uL (ref 15–500)
Eosinophils Relative: 2.2 %
HCT: 45.5 % (ref 38.5–50.0)
Hemoglobin: 15.2 g/dL (ref 13.2–17.1)
MCH: 29.3 pg (ref 27.0–33.0)
MCHC: 33.4 g/dL (ref 32.0–36.0)
MCV: 87.7 fL (ref 80.0–100.0)
MPV: 9.1 fL (ref 7.5–12.5)
Monocytes Relative: 8.4 %
Neutro Abs: 4054 {cells}/uL (ref 1500–7800)
Neutrophils Relative %: 69.9 %
Platelets: 256 10*3/uL (ref 140–400)
RBC: 5.19 10*6/uL (ref 4.20–5.80)
RDW: 14 % (ref 11.0–15.0)
Total Lymphocyte: 19 %
WBC: 5.8 10*3/uL (ref 3.8–10.8)

## 2024-03-26 LAB — COMPLETE METABOLIC PANEL WITHOUT GFR
AG Ratio: 1.6 (calc) (ref 1.0–2.5)
ALT: 10 U/L (ref 9–46)
AST: 14 U/L (ref 10–35)
Albumin: 4.4 g/dL (ref 3.6–5.1)
Alkaline phosphatase (APISO): 64 U/L (ref 35–144)
BUN: 22 mg/dL (ref 7–25)
CO2: 30 mmol/L (ref 20–32)
Calcium: 10.1 mg/dL (ref 8.6–10.3)
Chloride: 102 mmol/L (ref 98–110)
Creat: 1.07 mg/dL (ref 0.70–1.28)
Globulin: 2.7 g/dL (ref 1.9–3.7)
Glucose, Bld: 92 mg/dL (ref 65–99)
Potassium: 5 mmol/L (ref 3.5–5.3)
Sodium: 140 mmol/L (ref 135–146)
Total Bilirubin: 0.4 mg/dL (ref 0.2–1.2)
Total Protein: 7.1 g/dL (ref 6.1–8.1)

## 2024-03-26 LAB — LIPID PANEL
Cholesterol: 249 mg/dL — ABNORMAL HIGH (ref <200)
HDL: 58 mg/dL (ref >=40)
LDL Cholesterol (Calc): 167 mg/dL — ABNORMAL HIGH
Non-HDL Cholesterol (Calc): 191 mg/dL — ABNORMAL HIGH (ref <130)
Total CHOL/HDL Ratio: 4.3 (calc) (ref <5.0)
Triglycerides: 115 mg/dL (ref <150)

## 2024-03-26 LAB — PSA: PSA: 1.81 ng/mL (ref <=4.00)

## 2024-03-28 ENCOUNTER — Telehealth: Payer: Self-pay | Admitting: Internal Medicine

## 2024-03-28 ENCOUNTER — Encounter: Payer: Self-pay | Admitting: Internal Medicine

## 2024-03-28 ENCOUNTER — Ambulatory Visit: Payer: Medicare Other | Admitting: Internal Medicine

## 2024-03-28 VITALS — BP 130/88 | HR 77 | Ht 71.0 in | Wt 182.0 lb

## 2024-03-28 DIAGNOSIS — Z8659 Personal history of other mental and behavioral disorders: Secondary | ICD-10-CM

## 2024-03-28 DIAGNOSIS — Z96612 Presence of left artificial shoulder joint: Secondary | ICD-10-CM | POA: Diagnosis not present

## 2024-03-28 DIAGNOSIS — K219 Gastro-esophageal reflux disease without esophagitis: Secondary | ICD-10-CM

## 2024-03-28 DIAGNOSIS — Z0000 Encounter for general adult medical examination without abnormal findings: Secondary | ICD-10-CM

## 2024-03-28 DIAGNOSIS — I7121 Aneurysm of the ascending aorta, without rupture: Secondary | ICD-10-CM

## 2024-03-28 DIAGNOSIS — Z96611 Presence of right artificial shoulder joint: Secondary | ICD-10-CM

## 2024-03-28 DIAGNOSIS — Z87828 Personal history of other (healed) physical injury and trauma: Secondary | ICD-10-CM

## 2024-03-28 DIAGNOSIS — Z96651 Presence of right artificial knee joint: Secondary | ICD-10-CM

## 2024-03-28 DIAGNOSIS — Z96652 Presence of left artificial knee joint: Secondary | ICD-10-CM

## 2024-03-28 DIAGNOSIS — Z8619 Personal history of other infectious and parasitic diseases: Secondary | ICD-10-CM | POA: Diagnosis not present

## 2024-03-28 DIAGNOSIS — Z8719 Personal history of other diseases of the digestive system: Secondary | ICD-10-CM

## 2024-03-28 LAB — POCT URINALYSIS DIP (CLINITEK)
Bilirubin, UA: NEGATIVE
Blood, UA: NEGATIVE
Glucose, UA: NEGATIVE mg/dL
Ketones, POC UA: NEGATIVE mg/dL
Leukocytes, UA: NEGATIVE
Nitrite, UA: NEGATIVE
POC PROTEIN,UA: NEGATIVE
Spec Grav, UA: 1.01 (ref 1.010–1.025)
Urobilinogen, UA: 0.2 U/dL
pH, UA: 6.5 (ref 5.0–8.0)

## 2024-03-28 NOTE — Telephone Encounter (Signed)
 Before patient left from his appointment he said that the results for his cholesterol were a little high and he asked if Dr Liane Redman can take a look at them and just tell him what she thinks. Please advise

## 2024-03-29 NOTE — Telephone Encounter (Signed)
 Lvm to schedule

## 2024-04-01 DIAGNOSIS — F332 Major depressive disorder, recurrent severe without psychotic features: Secondary | ICD-10-CM | POA: Diagnosis not present

## 2024-04-01 DIAGNOSIS — F410 Panic disorder [episodic paroxysmal anxiety] without agoraphobia: Secondary | ICD-10-CM | POA: Diagnosis not present

## 2024-04-01 DIAGNOSIS — F411 Generalized anxiety disorder: Secondary | ICD-10-CM | POA: Diagnosis not present

## 2024-04-01 MED ORDER — PANTOPRAZOLE SODIUM 40 MG PO TBEC: DELAYED_RELEASE_TABLET | ORAL | 3 refills | Status: AC

## 2024-04-01 NOTE — Patient Instructions (Signed)
 May get Tdap and Shingles vaccine at pharmacy.

## 2024-04-02 DIAGNOSIS — M19012 Primary osteoarthritis, left shoulder: Secondary | ICD-10-CM | POA: Diagnosis not present

## 2024-04-02 DIAGNOSIS — M25612 Stiffness of left shoulder, not elsewhere classified: Secondary | ICD-10-CM | POA: Diagnosis not present

## 2024-04-02 DIAGNOSIS — M6281 Muscle weakness (generalized): Secondary | ICD-10-CM | POA: Diagnosis not present

## 2024-04-10 DIAGNOSIS — F419 Anxiety disorder, unspecified: Secondary | ICD-10-CM | POA: Diagnosis not present

## 2024-04-16 DIAGNOSIS — M25612 Stiffness of left shoulder, not elsewhere classified: Secondary | ICD-10-CM | POA: Diagnosis not present

## 2024-04-16 DIAGNOSIS — M19012 Primary osteoarthritis, left shoulder: Secondary | ICD-10-CM | POA: Diagnosis not present

## 2024-04-16 DIAGNOSIS — M6281 Muscle weakness (generalized): Secondary | ICD-10-CM | POA: Diagnosis not present

## 2024-04-22 DIAGNOSIS — M50123 Cervical disc disorder at C6-C7 level with radiculopathy: Secondary | ICD-10-CM | POA: Diagnosis not present

## 2024-04-22 DIAGNOSIS — M9901 Segmental and somatic dysfunction of cervical region: Secondary | ICD-10-CM | POA: Diagnosis not present

## 2024-04-22 DIAGNOSIS — M9905 Segmental and somatic dysfunction of pelvic region: Secondary | ICD-10-CM | POA: Diagnosis not present

## 2024-04-22 DIAGNOSIS — M9903 Segmental and somatic dysfunction of lumbar region: Secondary | ICD-10-CM | POA: Diagnosis not present

## 2024-04-22 DIAGNOSIS — M5412 Radiculopathy, cervical region: Secondary | ICD-10-CM | POA: Diagnosis not present

## 2024-04-22 DIAGNOSIS — M9902 Segmental and somatic dysfunction of thoracic region: Secondary | ICD-10-CM | POA: Diagnosis not present

## 2024-04-24 DIAGNOSIS — F419 Anxiety disorder, unspecified: Secondary | ICD-10-CM | POA: Diagnosis not present

## 2024-04-24 DIAGNOSIS — M9903 Segmental and somatic dysfunction of lumbar region: Secondary | ICD-10-CM | POA: Diagnosis not present

## 2024-04-24 DIAGNOSIS — M9902 Segmental and somatic dysfunction of thoracic region: Secondary | ICD-10-CM | POA: Diagnosis not present

## 2024-04-24 DIAGNOSIS — M9905 Segmental and somatic dysfunction of pelvic region: Secondary | ICD-10-CM | POA: Diagnosis not present

## 2024-04-24 DIAGNOSIS — M9901 Segmental and somatic dysfunction of cervical region: Secondary | ICD-10-CM | POA: Diagnosis not present

## 2024-05-01 DIAGNOSIS — M9905 Segmental and somatic dysfunction of pelvic region: Secondary | ICD-10-CM | POA: Diagnosis not present

## 2024-05-01 DIAGNOSIS — M9903 Segmental and somatic dysfunction of lumbar region: Secondary | ICD-10-CM | POA: Diagnosis not present

## 2024-05-01 DIAGNOSIS — F419 Anxiety disorder, unspecified: Secondary | ICD-10-CM | POA: Diagnosis not present

## 2024-05-01 DIAGNOSIS — M9902 Segmental and somatic dysfunction of thoracic region: Secondary | ICD-10-CM | POA: Diagnosis not present

## 2024-05-01 DIAGNOSIS — M9901 Segmental and somatic dysfunction of cervical region: Secondary | ICD-10-CM | POA: Diagnosis not present

## 2024-05-02 DIAGNOSIS — M9903 Segmental and somatic dysfunction of lumbar region: Secondary | ICD-10-CM | POA: Diagnosis not present

## 2024-05-02 DIAGNOSIS — M9905 Segmental and somatic dysfunction of pelvic region: Secondary | ICD-10-CM | POA: Diagnosis not present

## 2024-05-02 DIAGNOSIS — M9901 Segmental and somatic dysfunction of cervical region: Secondary | ICD-10-CM | POA: Diagnosis not present

## 2024-05-02 DIAGNOSIS — M9902 Segmental and somatic dysfunction of thoracic region: Secondary | ICD-10-CM | POA: Diagnosis not present

## 2024-05-03 DIAGNOSIS — M19012 Primary osteoarthritis, left shoulder: Secondary | ICD-10-CM | POA: Diagnosis not present

## 2024-05-06 DIAGNOSIS — M9905 Segmental and somatic dysfunction of pelvic region: Secondary | ICD-10-CM | POA: Diagnosis not present

## 2024-05-06 DIAGNOSIS — M9901 Segmental and somatic dysfunction of cervical region: Secondary | ICD-10-CM | POA: Diagnosis not present

## 2024-05-06 DIAGNOSIS — M9903 Segmental and somatic dysfunction of lumbar region: Secondary | ICD-10-CM | POA: Diagnosis not present

## 2024-05-06 DIAGNOSIS — M9902 Segmental and somatic dysfunction of thoracic region: Secondary | ICD-10-CM | POA: Diagnosis not present

## 2024-05-08 DIAGNOSIS — F419 Anxiety disorder, unspecified: Secondary | ICD-10-CM | POA: Diagnosis not present

## 2024-05-09 DIAGNOSIS — M9903 Segmental and somatic dysfunction of lumbar region: Secondary | ICD-10-CM | POA: Diagnosis not present

## 2024-05-09 DIAGNOSIS — M9901 Segmental and somatic dysfunction of cervical region: Secondary | ICD-10-CM | POA: Diagnosis not present

## 2024-05-09 DIAGNOSIS — M9902 Segmental and somatic dysfunction of thoracic region: Secondary | ICD-10-CM | POA: Diagnosis not present

## 2024-05-09 DIAGNOSIS — M9905 Segmental and somatic dysfunction of pelvic region: Secondary | ICD-10-CM | POA: Diagnosis not present

## 2024-05-13 DIAGNOSIS — M9903 Segmental and somatic dysfunction of lumbar region: Secondary | ICD-10-CM | POA: Diagnosis not present

## 2024-05-13 DIAGNOSIS — F410 Panic disorder [episodic paroxysmal anxiety] without agoraphobia: Secondary | ICD-10-CM | POA: Diagnosis not present

## 2024-05-13 DIAGNOSIS — F411 Generalized anxiety disorder: Secondary | ICD-10-CM | POA: Diagnosis not present

## 2024-05-13 DIAGNOSIS — M9901 Segmental and somatic dysfunction of cervical region: Secondary | ICD-10-CM | POA: Diagnosis not present

## 2024-05-13 DIAGNOSIS — M9902 Segmental and somatic dysfunction of thoracic region: Secondary | ICD-10-CM | POA: Diagnosis not present

## 2024-05-13 DIAGNOSIS — F332 Major depressive disorder, recurrent severe without psychotic features: Secondary | ICD-10-CM | POA: Diagnosis not present

## 2024-05-13 DIAGNOSIS — M9905 Segmental and somatic dysfunction of pelvic region: Secondary | ICD-10-CM | POA: Diagnosis not present

## 2024-05-14 DIAGNOSIS — F419 Anxiety disorder, unspecified: Secondary | ICD-10-CM | POA: Diagnosis not present

## 2024-05-16 DIAGNOSIS — M9901 Segmental and somatic dysfunction of cervical region: Secondary | ICD-10-CM | POA: Diagnosis not present

## 2024-05-16 DIAGNOSIS — M9902 Segmental and somatic dysfunction of thoracic region: Secondary | ICD-10-CM | POA: Diagnosis not present

## 2024-05-16 DIAGNOSIS — M9905 Segmental and somatic dysfunction of pelvic region: Secondary | ICD-10-CM | POA: Diagnosis not present

## 2024-05-16 DIAGNOSIS — M9903 Segmental and somatic dysfunction of lumbar region: Secondary | ICD-10-CM | POA: Diagnosis not present

## 2024-05-20 DIAGNOSIS — M9903 Segmental and somatic dysfunction of lumbar region: Secondary | ICD-10-CM | POA: Diagnosis not present

## 2024-05-20 DIAGNOSIS — M9905 Segmental and somatic dysfunction of pelvic region: Secondary | ICD-10-CM | POA: Diagnosis not present

## 2024-05-20 DIAGNOSIS — M9902 Segmental and somatic dysfunction of thoracic region: Secondary | ICD-10-CM | POA: Diagnosis not present

## 2024-05-20 DIAGNOSIS — M9901 Segmental and somatic dysfunction of cervical region: Secondary | ICD-10-CM | POA: Diagnosis not present

## 2024-05-23 DIAGNOSIS — M9901 Segmental and somatic dysfunction of cervical region: Secondary | ICD-10-CM | POA: Diagnosis not present

## 2024-05-23 DIAGNOSIS — M9905 Segmental and somatic dysfunction of pelvic region: Secondary | ICD-10-CM | POA: Diagnosis not present

## 2024-05-23 DIAGNOSIS — M9902 Segmental and somatic dysfunction of thoracic region: Secondary | ICD-10-CM | POA: Diagnosis not present

## 2024-05-23 DIAGNOSIS — M9903 Segmental and somatic dysfunction of lumbar region: Secondary | ICD-10-CM | POA: Diagnosis not present

## 2024-05-29 DIAGNOSIS — M9902 Segmental and somatic dysfunction of thoracic region: Secondary | ICD-10-CM | POA: Diagnosis not present

## 2024-05-29 DIAGNOSIS — M9903 Segmental and somatic dysfunction of lumbar region: Secondary | ICD-10-CM | POA: Diagnosis not present

## 2024-05-29 DIAGNOSIS — M9901 Segmental and somatic dysfunction of cervical region: Secondary | ICD-10-CM | POA: Diagnosis not present

## 2024-05-29 DIAGNOSIS — M9905 Segmental and somatic dysfunction of pelvic region: Secondary | ICD-10-CM | POA: Diagnosis not present

## 2024-06-04 DIAGNOSIS — F419 Anxiety disorder, unspecified: Secondary | ICD-10-CM | POA: Diagnosis not present

## 2024-06-06 DIAGNOSIS — M9901 Segmental and somatic dysfunction of cervical region: Secondary | ICD-10-CM | POA: Diagnosis not present

## 2024-06-06 DIAGNOSIS — M9903 Segmental and somatic dysfunction of lumbar region: Secondary | ICD-10-CM | POA: Diagnosis not present

## 2024-06-06 DIAGNOSIS — M9905 Segmental and somatic dysfunction of pelvic region: Secondary | ICD-10-CM | POA: Diagnosis not present

## 2024-06-06 DIAGNOSIS — M9902 Segmental and somatic dysfunction of thoracic region: Secondary | ICD-10-CM | POA: Diagnosis not present

## 2024-06-12 DIAGNOSIS — M9902 Segmental and somatic dysfunction of thoracic region: Secondary | ICD-10-CM | POA: Diagnosis not present

## 2024-06-12 DIAGNOSIS — F419 Anxiety disorder, unspecified: Secondary | ICD-10-CM | POA: Diagnosis not present

## 2024-06-12 DIAGNOSIS — M9901 Segmental and somatic dysfunction of cervical region: Secondary | ICD-10-CM | POA: Diagnosis not present

## 2024-06-12 DIAGNOSIS — M9903 Segmental and somatic dysfunction of lumbar region: Secondary | ICD-10-CM | POA: Diagnosis not present

## 2024-06-12 DIAGNOSIS — M9905 Segmental and somatic dysfunction of pelvic region: Secondary | ICD-10-CM | POA: Diagnosis not present

## 2024-06-17 DIAGNOSIS — M9903 Segmental and somatic dysfunction of lumbar region: Secondary | ICD-10-CM | POA: Diagnosis not present

## 2024-06-17 DIAGNOSIS — M9905 Segmental and somatic dysfunction of pelvic region: Secondary | ICD-10-CM | POA: Diagnosis not present

## 2024-06-17 DIAGNOSIS — M9901 Segmental and somatic dysfunction of cervical region: Secondary | ICD-10-CM | POA: Diagnosis not present

## 2024-06-17 DIAGNOSIS — M9902 Segmental and somatic dysfunction of thoracic region: Secondary | ICD-10-CM | POA: Diagnosis not present

## 2024-06-19 DIAGNOSIS — M9903 Segmental and somatic dysfunction of lumbar region: Secondary | ICD-10-CM | POA: Diagnosis not present

## 2024-06-19 DIAGNOSIS — M9905 Segmental and somatic dysfunction of pelvic region: Secondary | ICD-10-CM | POA: Diagnosis not present

## 2024-06-19 DIAGNOSIS — M9902 Segmental and somatic dysfunction of thoracic region: Secondary | ICD-10-CM | POA: Diagnosis not present

## 2024-06-19 DIAGNOSIS — M9901 Segmental and somatic dysfunction of cervical region: Secondary | ICD-10-CM | POA: Diagnosis not present

## 2024-06-24 DIAGNOSIS — M9902 Segmental and somatic dysfunction of thoracic region: Secondary | ICD-10-CM | POA: Diagnosis not present

## 2024-06-24 DIAGNOSIS — Z79899 Other long term (current) drug therapy: Secondary | ICD-10-CM | POA: Diagnosis not present

## 2024-06-24 DIAGNOSIS — F411 Generalized anxiety disorder: Secondary | ICD-10-CM | POA: Diagnosis not present

## 2024-06-24 DIAGNOSIS — M9903 Segmental and somatic dysfunction of lumbar region: Secondary | ICD-10-CM | POA: Diagnosis not present

## 2024-06-24 DIAGNOSIS — F410 Panic disorder [episodic paroxysmal anxiety] without agoraphobia: Secondary | ICD-10-CM | POA: Diagnosis not present

## 2024-06-24 DIAGNOSIS — M9901 Segmental and somatic dysfunction of cervical region: Secondary | ICD-10-CM | POA: Diagnosis not present

## 2024-06-24 DIAGNOSIS — F332 Major depressive disorder, recurrent severe without psychotic features: Secondary | ICD-10-CM | POA: Diagnosis not present

## 2024-06-24 DIAGNOSIS — M9905 Segmental and somatic dysfunction of pelvic region: Secondary | ICD-10-CM | POA: Diagnosis not present

## 2024-06-24 DIAGNOSIS — Z5181 Encounter for therapeutic drug level monitoring: Secondary | ICD-10-CM | POA: Diagnosis not present

## 2024-06-26 DIAGNOSIS — F419 Anxiety disorder, unspecified: Secondary | ICD-10-CM | POA: Diagnosis not present

## 2024-07-01 DIAGNOSIS — M25512 Pain in left shoulder: Secondary | ICD-10-CM | POA: Diagnosis not present

## 2024-07-01 DIAGNOSIS — M9903 Segmental and somatic dysfunction of lumbar region: Secondary | ICD-10-CM | POA: Diagnosis not present

## 2024-07-01 DIAGNOSIS — M9902 Segmental and somatic dysfunction of thoracic region: Secondary | ICD-10-CM | POA: Diagnosis not present

## 2024-07-01 DIAGNOSIS — M9901 Segmental and somatic dysfunction of cervical region: Secondary | ICD-10-CM | POA: Diagnosis not present

## 2024-07-01 DIAGNOSIS — M9905 Segmental and somatic dysfunction of pelvic region: Secondary | ICD-10-CM | POA: Diagnosis not present

## 2024-07-01 DIAGNOSIS — M25511 Pain in right shoulder: Secondary | ICD-10-CM | POA: Diagnosis not present

## 2024-07-10 DIAGNOSIS — F419 Anxiety disorder, unspecified: Secondary | ICD-10-CM | POA: Diagnosis not present

## 2024-07-16 DIAGNOSIS — M25511 Pain in right shoulder: Secondary | ICD-10-CM | POA: Diagnosis not present

## 2024-07-16 DIAGNOSIS — M25512 Pain in left shoulder: Secondary | ICD-10-CM | POA: Diagnosis not present

## 2024-07-19 DIAGNOSIS — H524 Presbyopia: Secondary | ICD-10-CM | POA: Diagnosis not present

## 2024-07-30 DIAGNOSIS — M25512 Pain in left shoulder: Secondary | ICD-10-CM | POA: Diagnosis not present

## 2024-07-30 DIAGNOSIS — M25511 Pain in right shoulder: Secondary | ICD-10-CM | POA: Diagnosis not present

## 2024-08-07 DIAGNOSIS — F419 Anxiety disorder, unspecified: Secondary | ICD-10-CM | POA: Diagnosis not present

## 2024-08-14 ENCOUNTER — Ambulatory Visit: Payer: Medicare Other | Admitting: Adult Health

## 2024-08-19 DIAGNOSIS — M25512 Pain in left shoulder: Secondary | ICD-10-CM | POA: Diagnosis not present

## 2024-08-19 DIAGNOSIS — M25511 Pain in right shoulder: Secondary | ICD-10-CM | POA: Diagnosis not present

## 2024-09-02 DIAGNOSIS — M25511 Pain in right shoulder: Secondary | ICD-10-CM | POA: Diagnosis not present

## 2024-09-02 DIAGNOSIS — M25512 Pain in left shoulder: Secondary | ICD-10-CM | POA: Diagnosis not present

## 2024-09-12 DIAGNOSIS — M9901 Segmental and somatic dysfunction of cervical region: Secondary | ICD-10-CM | POA: Diagnosis not present

## 2024-09-12 DIAGNOSIS — M9902 Segmental and somatic dysfunction of thoracic region: Secondary | ICD-10-CM | POA: Diagnosis not present

## 2024-09-12 DIAGNOSIS — M9905 Segmental and somatic dysfunction of pelvic region: Secondary | ICD-10-CM | POA: Diagnosis not present

## 2024-09-12 DIAGNOSIS — M9903 Segmental and somatic dysfunction of lumbar region: Secondary | ICD-10-CM | POA: Diagnosis not present

## 2024-09-17 DIAGNOSIS — M9905 Segmental and somatic dysfunction of pelvic region: Secondary | ICD-10-CM | POA: Diagnosis not present

## 2024-09-17 DIAGNOSIS — M9903 Segmental and somatic dysfunction of lumbar region: Secondary | ICD-10-CM | POA: Diagnosis not present

## 2024-09-17 DIAGNOSIS — M9902 Segmental and somatic dysfunction of thoracic region: Secondary | ICD-10-CM | POA: Diagnosis not present

## 2024-09-17 DIAGNOSIS — M9901 Segmental and somatic dysfunction of cervical region: Secondary | ICD-10-CM | POA: Diagnosis not present

## 2024-10-03 NOTE — Progress Notes (Signed)
 Patient Care Team: Perri Ronal PARAS, MD as PCP - General (Internal Medicine)  Visit Date: 10/10/24  Subjective:    Patient ID: Paul [name] , Male   DOB: [birth year], 73 y.o.    MRN: [id number]   73 y.o. Male presents today for  6 month follow up. Patient has a history of GE Reflux , Hypercholesteremia, Anxiety/depression, Erectile dysfunction.   Recently went on a trip to [place]  with family, He is enjoying his retirement.   History of Hypercholesteremia; 10/07/2024 Lipid panel CHOL 231 down from 249 in May, LDL 145 down from 16, Otherwise WNL. He has never had coronary calcium  score.    History of Anxiety/Depression treated with Xanax  0.5 mg as needed and Cymbalta  90 mg daily, under good control.     History of GE Reflux treated with Protonix  40 mg daily.    History of Erectile Dysfunction treated with Sildenafil  50 - 100 mg as needed.    Colonoscopy done in 2021.   Vaccine counseling: Influenza vaccine received today. Shingles and Tetanus vaccine discussed.   Past Medical History:  Diagnosis Date   Anxiety    Depression    Hepatitis    Hep C treated 12 years ago viral load negative after a year of treatment    Primary localized osteoarthritis of right knee 09/19/2019   S/P TKR (total knee replacement), left   11/12/2018 09/19/2019     Family History  Adopted: Yes  Family history unknown: Yes    Social History   Social History Narrative   Divorced. 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 2 adult children - son and daughter. Resides alone. Non-smoker.       Review of Systems  Constitutional:  Negative for fever and malaise/fatigue.  HENT:  Negative for congestion.   Eyes:  Negative for blurred vision.  Respiratory:  Negative for cough and shortness of breath.   Cardiovascular:  Negative for chest pain, palpitations and leg swelling.  Gastrointestinal:  Negative for vomiting.  Musculoskeletal:  Negative for  back pain.  Skin:  Negative for rash.  Neurological:  Negative for loss of consciousness and headaches.  All other systems reviewed and are negative.       Objective:   Vitals: BP 138/86   Pulse 68   Ht 5' 11 (1.803 m)   Wt 184 lb (83.5 kg)   SpO2 92%   BMI 25.66 kg/m    Physical Exam Vitals and nursing note reviewed. Exam conducted with a chaperone present.  Constitutional:      General: He is awake. He is not in acute distress.    Appearance: Normal appearance. He is not ill-appearing or toxic-appearing.  HENT:     Head: Normocephalic and atraumatic.     Right Ear: Tympanic membrane, ear canal and external ear normal.     Left Ear: Tympanic membrane, ear canal and external ear normal.     Mouth/Throat:     Pharynx: Oropharynx is clear.  Eyes:     Extraocular Movements: Extraocular movements intact.     Pupils: Pupils are equal, round, and reactive to light.  Neck:     Thyroid : No thyroid  mass, thyromegaly or thyroid  tenderness.     Vascular: No carotid bruit.  Cardiovascular:     Rate and Rhythm: Normal rate and regular rhythm. No extrasystoles are present.    Pulses:          Dorsalis pedis pulses are  2+ on the right side and 2+ on the left side.       Posterior tibial pulses are 2+ on the right side and 2+ on the left side.     Heart sounds: Normal heart sounds. No murmur heard.    No friction rub. No gallop.  Pulmonary:     Effort: Pulmonary effort is normal.     Breath sounds: Normal breath sounds. No decreased breath sounds, wheezing, rhonchi or rales.  Chest:     Chest wall: No mass.  Abdominal:     Palpations: Abdomen is soft. There is no hepatomegaly, splenomegaly or mass.     Tenderness: There is no abdominal tenderness.     Hernia: No hernia is present.  Genitourinary:    Prostate: Normal. Not enlarged, not tender and no nodules present.     Rectum: Normal. Guaiac result negative.     Comments: Prostate slightly enlarged, symmetrical.   Musculoskeletal:     Cervical back: Normal range of motion.     Right lower leg: No edema.     Left lower leg: No edema.  Lymphadenopathy:     Cervical: No cervical adenopathy.     Upper Body:     Right upper body: No supraclavicular adenopathy.     Left upper body: No supraclavicular adenopathy.  Skin:    General: Skin is warm and dry.  Neurological:     General: No focal deficit present.     Mental Status: He is alert and oriented to person, place, and time. Mental status is at baseline.     Cranial Nerves: Cranial nerves 2-12 are intact.     Sensory: Sensation is intact.     Motor: Motor function is intact.     Coordination: Coordination is intact.     Gait: Gait is intact.     Deep Tendon Reflexes: Reflexes are normal and symmetric.  Psychiatric:        Attention and Perception: Attention normal.        Mood and Affect: Mood normal.        Speech: Speech normal.        Behavior: Behavior normal. Behavior is cooperative.        Thought Content: Thought content normal.        Cognition and Memory: Cognition and memory normal.        Judgment: Judgment normal.       Results:     Labs:       Component Value Date/Time   NA 140 03/25/2024 0920   K 5.0 03/25/2024 0920   CL 102 03/25/2024 0920   CO2 30 03/25/2024 0920   GLUCOSE 92 03/25/2024 0920   BUN 22 03/25/2024 0920   CREATININE 1.07 03/25/2024 0920   CALCIUM  10.1 03/25/2024 0920   PROT 6.9 10/07/2024 1208   ALBUMIN 3.9 05/16/2020 1754   AST 18 10/07/2024 1208   ALT 15 10/07/2024 1208   ALKPHOS 62 05/16/2020 1754   BILITOT 0.4 10/07/2024 1208   GFRNONAA 83 03/18/2021 0950   GFRAA 96 03/18/2021 0950     Lab Results  Component Value Date   WBC 5.8 03/25/2024   HGB 15.2 03/25/2024   HCT 45.5 03/25/2024   MCV 87.7 03/25/2024   PLT 256 03/25/2024    Lab Results  Component Value Date   CHOL 231 (H) 10/07/2024   HDL 64 10/07/2024   LDLCALC 145 (H) 10/07/2024   TRIG 105 10/07/2024   CHOLHDL 3.6  10/07/2024  Lab Results  Component Value Date   HGBA1C 5.5 10/07/2024     Lab Results  Component Value Date   TSH 1.87 03/23/2023     Lab Results  Component Value Date   PSA 1.81 03/25/2024   PSA 1.58 03/23/2023   PSA 1.60 03/31/2022     Assessment & Plan:   Orders Placed This Encounter  Procedures   CT CARDIAC SCORING (SELF PAY ONLY)    [facility]   Flu vaccine HIGH DOSE PF(Fluzone Trivalent)   Meds ordered this encounter  Medications   rosuvastatin  (CRESTOR ) 10 MG tablet    Sig: Take 1 tablet (10 mg total) by mouth daily.    Dispense:  90 tablet    Refill:  1    Hypercholesteremia: 10/07/2024 Lipid panel CHOL 231 down from 249 in May, LDL 145 down from 16, Otherwise WNL. He has never had coronary calcium  score.    CT cardic scoring ordered   Rosuvastatin    10 mg daily prescribed.    Anxiety/Depression: treated with Xanax  0.5 mg as needed and Cymbalta  90 mg daily, symptoms are under good control.     GE Reflux: treated with Protonix  40 mg daily.    Erectile Dysfunction: treated with Sildenafil  50 - 100 mg as needed.    Colonoscopy done in 2021.   Vaccine counseling: Influenza vaccine received today. Shingles and Tetanus vaccine discussed.   Hx of multiple orthopedic surgeries.  Remote hx of Hepatitis C- treated and not active  Aneurysm of ascending aorta being followed last imaged in Dec 2024   I,Makayla C Reid,acting as a scribe for Ronal JINNY Hailstone, MD.,have documented all relevant documentation on the behalf of Ronal JINNY Hailstone, MD,as directed by  Ronal JINNY Hailstone, MD while in the presence of Ronal JINNY Hailstone, MD.  I, Ronal JINNY Hailstone, MD, have reviewed all documentation for this visit. The documentation on 10/10/2024 for the exam, diagnosis, procedures, and orders are all accurate and complete.

## 2024-10-07 ENCOUNTER — Other Ambulatory Visit: Payer: Self-pay

## 2024-10-07 DIAGNOSIS — R7302 Impaired glucose tolerance (oral): Secondary | ICD-10-CM | POA: Diagnosis not present

## 2024-10-07 DIAGNOSIS — Z0000 Encounter for general adult medical examination without abnormal findings: Secondary | ICD-10-CM

## 2024-10-07 DIAGNOSIS — E785 Hyperlipidemia, unspecified: Secondary | ICD-10-CM | POA: Diagnosis not present

## 2024-10-08 LAB — HEPATIC FUNCTION PANEL
AG Ratio: 1.7 (calc) (ref 1.0–2.5)
ALT: 15 U/L (ref 9–46)
AST: 18 U/L (ref 10–35)
Albumin: 4.3 g/dL (ref 3.6–5.1)
Alkaline phosphatase (APISO): 62 U/L (ref 35–144)
Bilirubin, Direct: 0.1 mg/dL (ref 0.0–0.2)
Globulin: 2.6 g/dL (ref 1.9–3.7)
Indirect Bilirubin: 0.3 mg/dL (ref 0.2–1.2)
Total Bilirubin: 0.4 mg/dL (ref 0.2–1.2)
Total Protein: 6.9 g/dL (ref 6.1–8.1)

## 2024-10-08 LAB — LIPID PANEL
Cholesterol: 231 mg/dL — ABNORMAL HIGH (ref <200)
HDL: 64 mg/dL (ref >=40)
LDL Cholesterol (Calc): 145 mg/dL — ABNORMAL HIGH
Non-HDL Cholesterol (Calc): 167 mg/dL — ABNORMAL HIGH (ref <130)
Total CHOL/HDL Ratio: 3.6 (calc) (ref <5.0)
Triglycerides: 105 mg/dL (ref <150)

## 2024-10-08 LAB — HEMOGLOBIN A1C
Hgb A1c MFr Bld: 5.5 % (ref <5.7)
Mean Plasma Glucose: 111 mg/dL
eAG (mmol/L): 6.2 mmol/L

## 2024-10-10 ENCOUNTER — Ambulatory Visit (INDEPENDENT_AMBULATORY_CARE_PROVIDER_SITE_OTHER): Payer: Self-pay | Admitting: Internal Medicine

## 2024-10-10 VITALS — BP 138/86 | HR 68 | Temp 98.5°F | Ht 71.0 in | Wt 184.0 lb

## 2024-10-10 DIAGNOSIS — Z23 Encounter for immunization: Secondary | ICD-10-CM

## 2024-10-10 DIAGNOSIS — Z8659 Personal history of other mental and behavioral disorders: Secondary | ICD-10-CM

## 2024-10-10 DIAGNOSIS — I712 Thoracic aortic aneurysm, without rupture, unspecified: Secondary | ICD-10-CM

## 2024-10-10 DIAGNOSIS — E7800 Pure hypercholesterolemia, unspecified: Secondary | ICD-10-CM | POA: Diagnosis not present

## 2024-10-10 DIAGNOSIS — K219 Gastro-esophageal reflux disease without esophagitis: Secondary | ICD-10-CM | POA: Diagnosis not present

## 2024-10-10 DIAGNOSIS — Z96651 Presence of right artificial knee joint: Secondary | ICD-10-CM

## 2024-10-10 DIAGNOSIS — F418 Other specified anxiety disorders: Secondary | ICD-10-CM | POA: Diagnosis not present

## 2024-10-10 DIAGNOSIS — Z96611 Presence of right artificial shoulder joint: Secondary | ICD-10-CM

## 2024-10-10 DIAGNOSIS — Z8619 Personal history of other infectious and parasitic diseases: Secondary | ICD-10-CM

## 2024-10-10 DIAGNOSIS — N539 Unspecified male sexual dysfunction: Secondary | ICD-10-CM

## 2024-10-10 DIAGNOSIS — Z8719 Personal history of other diseases of the digestive system: Secondary | ICD-10-CM

## 2024-10-10 DIAGNOSIS — Z96652 Presence of left artificial knee joint: Secondary | ICD-10-CM

## 2024-10-10 DIAGNOSIS — Z87828 Personal history of other (healed) physical injury and trauma: Secondary | ICD-10-CM

## 2024-10-10 DIAGNOSIS — Z96612 Presence of left artificial shoulder joint: Secondary | ICD-10-CM

## 2024-10-10 DIAGNOSIS — I7121 Aneurysm of the ascending aorta, without rupture: Secondary | ICD-10-CM

## 2024-10-10 MED ORDER — ROSUVASTATIN CALCIUM 10 MG PO TABS: 10.0000 mg | ORAL_TABLET | Freq: Every day | ORAL | 1 refills | Status: DC

## 2024-10-11 ENCOUNTER — Encounter: Payer: Self-pay | Admitting: Internal Medicine

## 2024-10-11 NOTE — Patient Instructions (Addendum)
 It was a pleasure to see you today. Statin medication ordered for high coronary calcium  score and hyperlipidemia. Anxiety/Depression well controlled on Cymbalta  and Xanax .Flu vaccine given. Will return in 6 months for medicare wellness and health maintenance exam.

## 2024-10-15 ENCOUNTER — Ambulatory Visit (HOSPITAL_COMMUNITY)
Admission: RE | Admit: 2024-10-15 | Discharge: 2024-10-15 | Disposition: A | Discharge status: Home / Self Care | Payer: Self-pay | Source: Ambulatory Visit | Attending: Internal Medicine | Admitting: Internal Medicine

## 2024-10-15 DIAGNOSIS — M9905 Segmental and somatic dysfunction of pelvic region: Secondary | ICD-10-CM | POA: Diagnosis not present

## 2024-10-15 DIAGNOSIS — M9903 Segmental and somatic dysfunction of lumbar region: Secondary | ICD-10-CM | POA: Diagnosis not present

## 2024-10-15 DIAGNOSIS — M9902 Segmental and somatic dysfunction of thoracic region: Secondary | ICD-10-CM | POA: Diagnosis not present

## 2024-10-15 DIAGNOSIS — E7800 Pure hypercholesterolemia, unspecified: Secondary | ICD-10-CM | POA: Insufficient documentation

## 2024-10-15 DIAGNOSIS — M9901 Segmental and somatic dysfunction of cervical region: Secondary | ICD-10-CM | POA: Diagnosis not present

## 2024-10-18 ENCOUNTER — Ambulatory Visit: Payer: Self-pay | Admitting: Internal Medicine

## 2024-10-18 DIAGNOSIS — R931 Abnormal findings on diagnostic imaging of heart and coronary circulation: Secondary | ICD-10-CM

## 2024-10-21 DIAGNOSIS — M9901 Segmental and somatic dysfunction of cervical region: Secondary | ICD-10-CM | POA: Diagnosis not present

## 2024-10-21 DIAGNOSIS — M9902 Segmental and somatic dysfunction of thoracic region: Secondary | ICD-10-CM | POA: Diagnosis not present

## 2024-10-21 DIAGNOSIS — M9903 Segmental and somatic dysfunction of lumbar region: Secondary | ICD-10-CM | POA: Diagnosis not present

## 2024-10-21 DIAGNOSIS — M9905 Segmental and somatic dysfunction of pelvic region: Secondary | ICD-10-CM | POA: Diagnosis not present

## 2024-10-25 DIAGNOSIS — M9902 Segmental and somatic dysfunction of thoracic region: Secondary | ICD-10-CM | POA: Diagnosis not present

## 2024-10-25 DIAGNOSIS — M9905 Segmental and somatic dysfunction of pelvic region: Secondary | ICD-10-CM | POA: Diagnosis not present

## 2024-10-25 DIAGNOSIS — M9901 Segmental and somatic dysfunction of cervical region: Secondary | ICD-10-CM | POA: Diagnosis not present

## 2024-10-25 DIAGNOSIS — M9903 Segmental and somatic dysfunction of lumbar region: Secondary | ICD-10-CM | POA: Diagnosis not present

## 2024-10-30 DIAGNOSIS — M9902 Segmental and somatic dysfunction of thoracic region: Secondary | ICD-10-CM | POA: Diagnosis not present

## 2024-10-30 DIAGNOSIS — M9901 Segmental and somatic dysfunction of cervical region: Secondary | ICD-10-CM | POA: Diagnosis not present

## 2024-10-30 NOTE — Telephone Encounter (Signed)
 Patient is adopted and has no knowledge of his family history. He is in excellent physical shape. Coronary calcium  score suggested as patient has had pure hypercholesterolemia for several years. He is in excellent physical shape, climbs and hikes. Discussed results of CT showing 4.1 cm mildly dilated aorta and needs annual imaging. Referral being made to Cardiology. MJB, MD

## 2024-11-05 DIAGNOSIS — D1801 Hemangioma of skin and subcutaneous tissue: Secondary | ICD-10-CM | POA: Diagnosis not present

## 2024-11-05 DIAGNOSIS — L918 Other hypertrophic disorders of the skin: Secondary | ICD-10-CM | POA: Diagnosis not present

## 2024-11-05 DIAGNOSIS — L821 Other seborrheic keratosis: Secondary | ICD-10-CM | POA: Diagnosis not present

## 2024-11-05 DIAGNOSIS — L814 Other melanin hyperpigmentation: Secondary | ICD-10-CM | POA: Diagnosis not present

## 2024-11-27 ENCOUNTER — Encounter: Payer: Self-pay | Admitting: Cardiovascular Disease

## 2024-11-27 ENCOUNTER — Ambulatory Visit: Attending: Cardiovascular Disease | Admitting: Cardiovascular Disease

## 2024-11-27 ENCOUNTER — Telehealth: Payer: Self-pay

## 2024-11-27 VITALS — BP 160/86 | HR 62 | Ht 71.0 in | Wt 180.0 lb

## 2024-11-27 DIAGNOSIS — E782 Mixed hyperlipidemia: Secondary | ICD-10-CM | POA: Diagnosis not present

## 2024-11-27 DIAGNOSIS — R931 Abnormal findings on diagnostic imaging of heart and coronary circulation: Secondary | ICD-10-CM | POA: Diagnosis not present

## 2024-11-27 DIAGNOSIS — E785 Hyperlipidemia, unspecified: Secondary | ICD-10-CM | POA: Insufficient documentation

## 2024-11-27 DIAGNOSIS — Z7689 Persons encountering health services in other specified circumstances: Secondary | ICD-10-CM

## 2024-11-27 DIAGNOSIS — I7121 Aneurysm of the ascending aorta, without rupture: Secondary | ICD-10-CM

## 2024-11-27 MED ORDER — ROSUVASTATIN CALCIUM 40 MG PO TABS: 40.0000 mg | ORAL_TABLET | Freq: Every day | ORAL | 3 refills | Status: AC

## 2024-11-27 NOTE — Assessment & Plan Note (Signed)
 History of hyperlipidemia with recent lipid profile performed 10/07/2024 revealing total cholesterol 231, LDL of 145 and HDL of 64.  He was recently begun on rosuvastatin  10 mg a day.  LDL goal less than 70 given his significantly elevated coronary calcium  score.  I am going to increase his rosuvastatin  from 10 to 40 mg a day.  Will recheck a lipid liver profile in 3 months.

## 2024-11-27 NOTE — Patient Instructions (Addendum)
 Medication Instructions:  Your physician has recommended you make the following change in your medication:   -Increase rosuvastatin  (Crestor ) to 40mg  once daily.  *If you need a refill on your cardiac medications before your next appointment, please call your pharmacy*  Lab Work: Your physician recommends that you return for lab work in: 3 month for FASTING lipid/liver panel  If you have labs (blood work) drawn today and your tests are completely normal, you will receive your results only by: MyChart Message (if you have MyChart) OR A paper copy in the mail If you have any lab test that is abnormal or we need to change your treatment, we will call you to review the results.  Testing/Procedures: Non-Cardiac CT Angiography (CTA) chest, is a special type of CT scan that uses a computer to produce multi-dimensional views of major blood vessels throughout the body. In CT angiography, a contrast material is injected through an IV to help visualize the blood vessels. **To do in November**   Follow-Up: At [facility], you and your health needs are our priority.  As part of our continuing mission to provide you with exceptional heart care, our providers are all part of one team.  This team includes your primary Cardiologist (physician) and Advanced Practice Providers or APPs (Physician Assistants and Nurse Practitioners) who all work together to provide you with the care you need, when you need it.  Your next appointment:   12 month(s)  Provider:   Dorn Lesches, MD    We recommend signing up for the patient portal called MyChart.  Sign up information is provided on this After Visit Summary.  MyChart is used to connect with patients for Virtual Visits (Telemedicine).  Patients are able to view lab/test results, encounter notes, upcoming appointments, etc.  Non-urgent messages can be sent to your provider as well.   To learn more about what you can do with MyChart, go to  [email or web address].   Other Instructions    Please report to Radiology at the [facility] Main Entrance 30 minutes early for your test.  [address] [place], [place] [address]                          How to Prepare for Your Cardiac PET/CT Stress Test:  Nothing to eat or drink, except water , 3 hours prior to arrival time.  NO caffeine/decaffeinated products, or chocolate 12 hours prior to arrival. (Please note decaffeinated beverages (teas/coffees) still contain caffeine).  If you have caffeine within 12 hours prior, the test will need to be rescheduled.  Medication instructions: Do not take erectile dysfunction medications for 72 hours prior to test (sildenafil , tadalafil ) Do not take nitrates (isosorbide mononitrate, Ranexa) the day before or day of test Do not take tamsulosin the day before or morning of test Hold theophylline containing medications for 12 hours. Hold Dipyridamole 48 hours prior to the test.  Diabetic Preparation: If able to eat breakfast prior to 3 hour fasting, you may take all medications, including your insulin. Do not worry if you miss your breakfast dose of insulin - start at your next meal. If you do not eat prior to 3 hour fast-Hold all diabetes (oral and insulin) medications. Patients who wear a continuous glucose monitor MUST remove the device prior to scanning.  You may take your remaining medications with water .  NO perfume, cologne or lotion on chest or abdomen area. FEMALES - Please avoid wearing dresses  to this appointment.  Total time is 1 to 2 hours; you may want to bring reading material for the waiting time.  IF YOU THINK YOU MAY BE PREGNANT, OR ARE NURSING PLEASE INFORM THE TECHNOLOGIST.  In preparation for your appointment, medication and supplies will be purchased.  Appointment availability is limited, so if you need to cancel or reschedule, please call the Radiology Department Scheduler at [phone] 24 hours in  advance to avoid a cancellation fee of $100.00  What to Expect When you Arrive:  Once you arrive and check in for your appointment, you will be taken to a preparation room within the Radiology Department.  A technologist or Nurse will obtain your medical history, verify that you are correctly prepped for the exam, and explain the procedure.  Afterwards, an IV will be started in your arm and electrodes will be placed on your skin for EKG monitoring during the stress portion of the exam. Then you will be escorted to the PET/CT scanner.  There, staff will get you positioned on the scanner and obtain a blood pressure and EKG.  During the exam, you will continue to be connected to the EKG and blood pressure machines.  A small, safe amount of a radioactive tracer will be injected in your IV to obtain a series of pictures of your heart along with an injection of a stress agent.    After your Exam:  It is recommended that you eat a meal and drink a caffeinated beverage to counter act any effects of the stress agent.  Drink plenty of fluids for the remainder of the day and urinate frequently for the first couple of hours after the exam.  Your doctor will inform you of your test results within 7-10 business days.  For more information and frequently asked questions, please visit our website: [email or web address]  For questions about your test or how to prepare for your test, please call: Cardiac Imaging Nurse Navigators Office: [phone]

## 2024-11-27 NOTE — Assessment & Plan Note (Signed)
 41 mm by recent chest CT.  This will need to be followed on annual basis.

## 2024-11-27 NOTE — Telephone Encounter (Signed)
 Reminder pending for pt to get labs drawn in 3 months per Dr. Court.

## 2024-11-27 NOTE — Assessment & Plan Note (Signed)
 Recent coronary calcium  score of 1280 distributed in all 3 coronary arteries especially LAD and RCA although he does have disease in his left main.  I am going to get a cardiac PET study to restratify.

## 2024-11-27 NOTE — Progress Notes (Signed)
 "     11/27/2024 Paul [name]   09-11-1951  [id number]  Primary Physician Baxley, Ronal PARAS, MD Primary Cardiologist: Dorn PARAS Lesches MD [place] [place], [place]  HPI:  Paul [name] is a 74 y.o. male thin and fit appearing divorced Caucasian male father of 2 children who is retired ICU, burn and neuro ICU nurse referred by Dr. Perri, his PCP, because of an elevated coronary calcium  score.  His risk factors include moderately elevated cholesterol.  He was adopted.  He has never had heart attack or stroke.  He denies chest pain.  He is fairly active and does 2 hours of CrossFit several days a week, rock climbs and does mountain biking.  He said bilateral shoulder and knee replacements as well.  He did have a coronary calcium  score performed 10/10/2024 which was 1280 distributed in all 3 coronary arteries specially the LAD and RCA although he did have disease in his left main.  His ascending thoracic aorta measured 45 mm.   Active Medications[1]   Allergies[2]  Social History   Socioeconomic History   Marital status: Divorced    Spouse name: Not on file   Number of children: Not on file   Years of education: Not on file   Highest education level: Associate degree: occupational, scientist, product/process development, or vocational program  Occupational History   Occupation: nurse  Tobacco Use   Smoking status: Never   Smokeless tobacco: Never  Vaping Use   Vaping status: Never Used  Substance and Sexual Activity   Alcohol use: Yes    Comment: daily 2-3 beers    Drug use: Yes    Types: Marijuana    Comment: every day or two   Sexual activity: Yes  Other Topics Concern   Not on file  Social History Narrative   Divorced. 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 2 adult children - son and daughter. Resides alone. Non-smoker.    Social Drivers of Health   Tobacco Use: Low Risk (11/27/2024)   Patient History    Smoking Tobacco Use: Never    Smokeless  Tobacco Use: Never    Passive Exposure: Not on file  Financial Resource Strain: Low Risk (10/06/2024)   Overall Financial Resource Strain (CARDIA)    Difficulty of Paying Living Expenses: Not hard at all  Food Insecurity: Unknown (10/06/2024)   Epic    Worried About Programme Researcher, Broadcasting/film/video in the Last Year: Not on file    [employer] of Food in the Last Year: Never true  Transportation Needs: No Transportation Needs (10/06/2024)   Epic    Lack of Transportation (Medical): No    Lack of Transportation (Non-Medical): No  Physical Activity: Sufficiently Active (03/25/2024)   Exercise Vital Sign    Days of Exercise per Week: 6 days    Minutes of Exercise per Session: 120 min  Stress: No Stress Concern Present (10/06/2024)   [employer] of Occupational Health - Occupational Stress Questionnaire    Feeling of Stress: Only a little  Social Connections: Unknown (10/06/2024)   Social Connection and Isolation Panel    Frequency of Communication with Friends and Family: More than three times a week    Frequency of Social Gatherings with Friends and Family: Twice a week    Attends Religious Services: Never    Database Administrator or Organizations: No    Attends Banker Meetings: Not on file  Marital Status: Not on file  Intimate Partner Violence: Not At Risk (03/28/2024)   Humiliation, Afraid, Rape, and Kick questionnaire    Fear of Current or Ex-Partner: No    Emotionally Abused: No    Physically Abused: No    Sexually Abused: No  Depression (PHQ2-9): Low Risk (10/10/2024)   Depression (PHQ2-9)    PHQ-2 Score: 0  Alcohol Screen: Low Risk (10/06/2024)   Alcohol Screen    Last Alcohol Screening Score (AUDIT): 4  Housing: Unknown (10/06/2024)   Epic    Unable to Pay for Housing in the Last Year: No    Number of Times Moved in the Last Year: Not on file    Homeless in the Last Year: No  Utilities: Not At Risk (03/25/2024)   [employer] Utilities    Threatened with loss of utilities:  No  Health Literacy: Adequate Health Literacy (03/25/2024)   B1300 Health Literacy    Frequency of need for help with medical instructions: Never     Review of Systems: General: negative for chills, fever, night sweats or weight changes.  Cardiovascular: negative for chest pain, dyspnea on exertion, edema, orthopnea, palpitations, paroxysmal nocturnal dyspnea or shortness of breath Dermatological: negative for rash Respiratory: negative for cough or wheezing Urologic: negative for hematuria Abdominal: negative for nausea, vomiting, diarrhea, bright red blood per rectum, melena, or hematemesis Neurologic: negative for visual changes, syncope, or dizziness All other systems reviewed and are otherwise negative except as noted above.    Blood pressure (!) 160/86, pulse 62, height 5' 11 (1.803 m), weight 180 lb (81.6 kg), SpO2 99%.  General appearance: alert and no distress Neck: no adenopathy, no carotid bruit, no JVD, supple, symmetrical, trachea midline, and thyroid  not enlarged, symmetric, no tenderness/mass/nodules Lungs: clear to auscultation bilaterally Heart: regular rate and rhythm, S1, S2 normal, no murmur, click, rub or gallop Extremities: extremities normal, atraumatic, no cyanosis or edema Pulses: 2+ and symmetric Skin: Skin color, texture, turgor normal. No rashes or lesions Neurologic: Grossly normal  EKG EKG Interpretation Date/Time:  Wednesday November 27 2024 14:45:17 EST Ventricular Rate:  62 PR Interval:  200 QRS Duration:  88 QT Interval:  374 QTC Calculation: 379 R Axis:   -32  Text Interpretation: Normal sinus rhythm with sinus arrhythmia Left axis deviation When compared with ECG of 10-Nov-2019 12:31, PREVIOUS ECG IS PRESENT Confirmed by Court Carrier [phone]) on 11/27/2024 3:07:59 PM    ASSESSMENT AND PLAN:   Thoracic aortic aneurysm without rupture 41 mm by recent chest CT.  This will need to be followed on annual basis.  Hyperlipidemia History of  hyperlipidemia with recent lipid profile performed 10/07/2024 revealing total cholesterol 231, LDL of 145 and HDL of 64.  He was recently begun on rosuvastatin  10 mg a day.  LDL goal less than 70 given his significantly elevated coronary calcium  score.  I am going to increase his rosuvastatin  from 10 to 40 mg a day.  Will recheck a lipid liver profile in 3 months.  Elevated coronary artery calcium  score Recent coronary calcium  score of 1280 distributed in all 3 coronary arteries especially LAD and RCA although he does have disease in his left main.  I am going to get a cardiac PET study to restratify.     Carrier DOROTHA Court MD FACP,FACC,FAHA, FSCAI 11/27/2024 3:23 PM    [1]  Current Meds  Medication Sig   ALPRAZolam  (XANAX ) 0.5 MG tablet Take 1 tablet (0.5 mg total) by mouth 2 (two) times daily as  needed for anxiety. (Patient taking differently: Take 0.5 mg by mouth as needed for anxiety.)   DULoxetine  (CYMBALTA ) 60 MG capsule TAKE 1 CAPSULE BY MOUTH EVERY DAY (Patient taking differently: Take 90 mg by mouth daily.)   pantoprazole  (PROTONIX ) 40 MG tablet TAKE ONE TABLET BY MOUTH DAILY FOR GERD   rosuvastatin  (CRESTOR ) 10 MG tablet Take 1 tablet (10 mg total) by mouth daily.   sildenafil  (VIAGRA ) 100 MG tablet Take 0.5-1 tablets (50-100 mg total) by mouth daily as needed for erectile dysfunction.  [2]  Allergies Allergen Reactions   Ciprofloxacin  Other (See Comments)    Pt states with his Cymbalta  it makes his depression worst   Flagyl  [Metronidazole ] Other (See Comments)    Pt states when he takes with his Cymbalta  it makes his depression worse.    "

## 2024-12-23 ENCOUNTER — Encounter (HOSPITAL_COMMUNITY): Payer: Self-pay

## 2024-12-25 ENCOUNTER — Encounter (HOSPITAL_COMMUNITY): Admission: RE | Admit: 2024-12-25 | Source: Ambulatory Visit

## 2024-12-25 DIAGNOSIS — I7121 Aneurysm of the ascending aorta, without rupture: Secondary | ICD-10-CM

## 2024-12-25 DIAGNOSIS — R931 Abnormal findings on diagnostic imaging of heart and coronary circulation: Secondary | ICD-10-CM

## 2024-12-25 DIAGNOSIS — E782 Mixed hyperlipidemia: Secondary | ICD-10-CM

## 2024-12-25 LAB — NM PET CT CARDIAC PERFUSION MULTI W/ABSOLUTE BLOODFLOW
LV dias vol: 93 mL (ref 62–150)
MBFR: 2.87
Nuc Rest EF: 53 %
Nuc Stress EF: 55 %
Peak HR: 104 {beats}/min
Rest HR: 71 {beats}/min
Rest MBF: 0.55 ml/g/min
Rest Nuclear Isotope Dose: 21.2 mCi
ST Depression (mm): 0 mm
Stress MBF: 1.58 ml/g/min
Stress Nuclear Isotope Dose: 21.3 mCi
TID: 1.07

## 2024-12-25 MED ORDER — REGADENOSON 0.4 MG/5ML IV SOLN
0.4000 mg | Freq: Once | INTRAVENOUS | Status: AC
  Administered 2024-12-25: 0.4 mg via INTRAVENOUS

## 2024-12-25 MED ORDER — RUBIDIUM RB82 GENERATOR (RUBYFILL)
21.2000 | PACK | Freq: Once | INTRAVENOUS | Status: AC
  Administered 2024-12-25: 21.2 via INTRAVENOUS

## 2024-12-25 MED ORDER — REGADENOSON 0.4 MG/5ML IV SOLN
INTRAVENOUS | Status: AC
  Filled 2024-12-25: qty 5

## 2024-12-26 ENCOUNTER — Ambulatory Visit: Payer: Self-pay | Admitting: Cardiovascular Disease

## 2025-03-31 ENCOUNTER — Other Ambulatory Visit: Payer: Self-pay

## 2025-04-03 ENCOUNTER — Ambulatory Visit: Payer: Self-pay | Admitting: Internal Medicine

## 2025-10-20 ENCOUNTER — Other Ambulatory Visit (HOSPITAL_COMMUNITY)
